# Patient Record
Sex: Male | Born: 1955 | Race: Black or African American | Hispanic: No | Marital: Single | Smoking: Current every day smoker
Health system: Southern US, Community
[De-identification: ages and names within clinical notes are randomized; demographics above are authoritative.]

## PROBLEM LIST (undated history)

## (undated) DIAGNOSIS — I639 Cerebral infarction, unspecified: Secondary | ICD-10-CM

## (undated) DIAGNOSIS — G459 Transient cerebral ischemic attack, unspecified: Secondary | ICD-10-CM

## (undated) DIAGNOSIS — G9340 Encephalopathy, unspecified: Secondary | ICD-10-CM

## (undated) DIAGNOSIS — I10 Essential (primary) hypertension: Secondary | ICD-10-CM

## (undated) DIAGNOSIS — E7800 Pure hypercholesterolemia, unspecified: Secondary | ICD-10-CM

## (undated) HISTORY — PX: NO PAST SURGERIES: SHX2092

---

## 2017-06-23 ENCOUNTER — Emergency Department (HOSPITAL_COMMUNITY)
Admission: EM | Admit: 2017-06-23 | Discharge: 2017-06-23 | Disposition: A | Discharge status: Home / Self Care | Payer: Medicaid Other | Attending: Emergency Medicine | Admitting: Emergency Medicine

## 2017-06-23 ENCOUNTER — Encounter (HOSPITAL_COMMUNITY): Payer: Self-pay

## 2017-06-23 ENCOUNTER — Other Ambulatory Visit: Payer: Self-pay

## 2017-06-23 ENCOUNTER — Emergency Department (HOSPITAL_COMMUNITY): Payer: Medicaid Other

## 2017-06-23 DIAGNOSIS — X58XXXA Exposure to other specified factors, initial encounter: Secondary | ICD-10-CM | POA: Insufficient documentation

## 2017-06-23 DIAGNOSIS — Y939 Activity, unspecified: Secondary | ICD-10-CM | POA: Insufficient documentation

## 2017-06-23 DIAGNOSIS — Y999 Unspecified external cause status: Secondary | ICD-10-CM | POA: Diagnosis not present

## 2017-06-23 DIAGNOSIS — Y929 Unspecified place or not applicable: Secondary | ICD-10-CM | POA: Diagnosis not present

## 2017-06-23 DIAGNOSIS — S8991XA Unspecified injury of right lower leg, initial encounter: Secondary | ICD-10-CM | POA: Diagnosis present

## 2017-06-23 DIAGNOSIS — M25561 Pain in right knee: Secondary | ICD-10-CM

## 2017-06-23 MED ORDER — MELOXICAM 7.5 MG PO TABS: 7.5000 mg | ORAL_TABLET | Freq: Every day | ORAL | 0 refills | Status: DC

## 2017-06-23 NOTE — ED Provider Notes (Signed)
[place] DEPT Provider Note   CSN: [id number] Arrival date & time: 06/23/17  1335     History   Chief Complaint Chief Complaint  Patient presents with  . Leg Injury    HPI Karl Miller is a 61 y.o. male.  The history is provided by the patient. No language interpreter was used.  Knee Pain   This is a new problem. The problem occurs constantly. The problem has been gradually worsening. The pain is present in the right knee. The quality of the pain is described as aching. The pain is moderate. He has tried nothing for the symptoms. The treatment provided no relief. There has been no history of extremity trauma.  Pt complains of pain in his right knee,  Pain with walking.  Pt reports he was seen at [facility] 3 weeks ago.  Pt reports he was given medication that did not work.   History reviewed. No pertinent past medical history.  There are no active problems to display for this patient.   History reviewed. No pertinent surgical history.     Home Medications    Prior to Admission medications   Not on File    Family History No family history on file.  Social History Social History   Tobacco Use  . Smoking status: Never Smoker  . Smokeless tobacco: Never Used  Substance Use Topics  . Alcohol use: No    Frequency: Never  . Drug use: No     Allergies   Patient has no known allergies.   Review of Systems Review of Systems  Musculoskeletal: Negative for joint swelling.  All other systems reviewed and are negative.    Physical Exam Updated Vital Signs BP (!) 147/109 (BP Location: Left Arm)   Pulse 86   Temp 97.7 F (36.5 C) (Oral)   Resp 20   Ht 5\' 9"  (1.753 m)   Wt 77.1 kg (170 lb)   SpO2 100%   BMI 25.10 kg/m   Physical Exam  Constitutional: He appears well-developed and well-nourished.  HENT:  Head: Normocephalic.  Eyes: Pupils are equal, round, and reactive to light.  Neck: Normal range of motion.    Musculoskeletal: He exhibits tenderness.  Tender posterior right knee, pain with range of motion,  nv and ns intact  Neurological: He is alert.  Skin: Skin is warm.  Nursing note and vitals reviewed.    ED Treatments / Results  Labs (all labs ordered are listed, but only abnormal results are displayed) Labs Reviewed - No data to display  EKG  EKG Interpretation None       Radiology Dg Knee Complete 4 Views Right  Result Date: 06/23/2017 CLINICAL DATA:  Posterior right knee pain EXAM: RIGHT KNEE - COMPLETE 4+ VIEW COMPARISON:  None. FINDINGS: No evidence of fracture, dislocation, or joint effusion. No evidence of arthropathy or other focal bone abnormality. Soft tissues are unremarkable. IMPRESSION: No acute osseous injury of the right knee. Electronically Signed   By: Kathreen Devoid   On: 06/23/2017 15:31    Procedures Procedures (including critical care time)  Medications Ordered in ED Medications - No data to display   Initial Impression / Assessment and Plan / ED Course  I have reviewed the triage vital signs and the nursing notes.  Pertinent labs & imaging results that were available during my care of the patient were reviewed by me and considered in my medical decision making (see chart for details).  Knee sleeve.  Pt advised to follow up with his MD at [facility] for recheck.  Final Clinical Impressions(s) / ED Diagnoses   Final diagnoses:  Acute pain of right knee    ED Discharge Orders        Ordered    meloxicam (MOBIC) 7.5 MG tablet  Daily     06/23/17 1607       Fransico Meadow, [place] 06/23/17 1656    Lacretia Leigh, MD 06/26/17 [phone]

## 2017-06-23 NOTE — ED Provider Notes (Signed)
  [place] DEPT Provider Note   CSN: [id number] Arrival date & time: 06/23/17  1335     History   Chief Complaint Chief Complaint  Patient presents with  . Leg Injury    HPI Karl Miller is a 61 y.o. male.  HPI  History reviewed. No pertinent past medical history.  There are no active problems to display for this patient.   History reviewed. No pertinent surgical history.     Home Medications    Prior to Admission medications   Not on File    Family History No family history on file.  Social History Social History   Tobacco Use  . Smoking status: Never Smoker  . Smokeless tobacco: Never Used  Substance Use Topics  . Alcohol use: No    Frequency: Never  . Drug use: No     Allergies   Patient has no known allergies.   Review of Systems Review of Systems   Physical Exam Updated Vital Signs BP (!) 147/109 (BP Location: Left Arm)   Pulse 86   Temp 97.7 F (36.5 C) (Oral)   Resp 20   Ht 5\' 9"  (1.753 m)   Wt 77.1 kg (170 lb)   SpO2 100%   BMI 25.10 kg/m   Physical Exam   ED Treatments / Results  Labs (all labs ordered are listed, but only abnormal results are displayed) Labs Reviewed - No data to display  EKG  EKG Interpretation None       Radiology No results found.  Procedures Procedures (including critical care time)  Medications Ordered in ED Medications - No data to display   Initial Impression / Assessment and Plan / ED Course  I have reviewed the triage vital signs and the nursing notes.  Pertinent labs & imaging results that were available during my care of the patient were reviewed by me and considered in my medical decision making (see chart for details).     An After Visit Summary was printed and given to the patient.   Final Clinical Impressions(s) / ED Diagnoses   Final diagnoses:  Acute pain of right knee    ED Discharge Orders        Ordered    meloxicam  (MOBIC) 7.5 MG tablet  Daily     06/23/17 1607    [facility]   Sidney Ace 06/25/17 1732    Lacretia Leigh, MD 06/26/17 [phone]

## 2017-06-23 NOTE — Discharge Instructions (Signed)
Follow up with your Physician at the [facility] for recheck

## 2017-06-23 NOTE — ED Triage Notes (Signed)
Pt reports that he feels as if he pulled something in the back of his rt knee 3 weeks ago. Pt reports pain with ambulation and when leg extension. 8/10 aches. No deformities , redness or swelling is noted. Pt denies any injuries.

## 2017-12-03 ENCOUNTER — Inpatient Hospital Stay (HOSPITAL_COMMUNITY)
Admission: EM | Admit: 2017-12-03 | Discharge: 2017-12-09 | DRG: 041 | Disposition: A | Discharge status: Skilled Nursing Facility | Payer: Medicaid Other | Attending: Student in an Organized Health Care Education/Training Program | Admitting: Student in an Organized Health Care Education/Training Program

## 2017-12-03 ENCOUNTER — Other Ambulatory Visit: Payer: Self-pay

## 2017-12-03 ENCOUNTER — Encounter (HOSPITAL_COMMUNITY): Payer: Self-pay | Admitting: Emergency Medicine

## 2017-12-03 DIAGNOSIS — F17210 Nicotine dependence, cigarettes, uncomplicated: Secondary | ICD-10-CM | POA: Diagnosis present

## 2017-12-03 DIAGNOSIS — I739 Peripheral vascular disease, unspecified: Secondary | ICD-10-CM | POA: Diagnosis present

## 2017-12-03 DIAGNOSIS — Z7952 Long term (current) use of systemic steroids: Secondary | ICD-10-CM

## 2017-12-03 DIAGNOSIS — G459 Transient cerebral ischemic attack, unspecified: Secondary | ICD-10-CM

## 2017-12-03 DIAGNOSIS — N179 Acute kidney failure, unspecified: Secondary | ICD-10-CM

## 2017-12-03 DIAGNOSIS — J439 Emphysema, unspecified: Secondary | ICD-10-CM | POA: Diagnosis present

## 2017-12-03 DIAGNOSIS — Z803 Family history of malignant neoplasm of breast: Secondary | ICD-10-CM

## 2017-12-03 DIAGNOSIS — I6340 Cerebral infarction due to embolism of unspecified cerebral artery: Secondary | ICD-10-CM

## 2017-12-03 DIAGNOSIS — N400 Enlarged prostate without lower urinary tract symptoms: Secondary | ICD-10-CM | POA: Diagnosis present

## 2017-12-03 DIAGNOSIS — D62 Acute posthemorrhagic anemia: Secondary | ICD-10-CM

## 2017-12-03 DIAGNOSIS — N183 Chronic kidney disease, stage 3 unspecified: Secondary | ICD-10-CM

## 2017-12-03 DIAGNOSIS — M6282 Rhabdomyolysis: Secondary | ICD-10-CM

## 2017-12-03 DIAGNOSIS — E669 Obesity, unspecified: Secondary | ICD-10-CM | POA: Diagnosis present

## 2017-12-03 DIAGNOSIS — I63412 Cerebral infarction due to embolism of left middle cerebral artery: Secondary | ICD-10-CM

## 2017-12-03 DIAGNOSIS — Z720 Tobacco use: Secondary | ICD-10-CM

## 2017-12-03 DIAGNOSIS — G8191 Hemiplegia, unspecified affecting right dominant side: Secondary | ICD-10-CM | POA: Diagnosis present

## 2017-12-03 DIAGNOSIS — I63531 Cerebral infarction due to unspecified occlusion or stenosis of right posterior cerebral artery: Principal | ICD-10-CM | POA: Diagnosis present

## 2017-12-03 DIAGNOSIS — R7303 Prediabetes: Secondary | ICD-10-CM

## 2017-12-03 DIAGNOSIS — I5189 Other ill-defined heart diseases: Secondary | ICD-10-CM

## 2017-12-03 DIAGNOSIS — A539 Syphilis, unspecified: Secondary | ICD-10-CM

## 2017-12-03 DIAGNOSIS — Z6826 Body mass index (BMI) 26.0-26.9, adult: Secondary | ICD-10-CM

## 2017-12-03 DIAGNOSIS — E785 Hyperlipidemia, unspecified: Secondary | ICD-10-CM

## 2017-12-03 DIAGNOSIS — Z806 Family history of leukemia: Secondary | ICD-10-CM

## 2017-12-03 DIAGNOSIS — Z8673 Personal history of transient ischemic attack (TIA), and cerebral infarction without residual deficits: Secondary | ICD-10-CM

## 2017-12-03 DIAGNOSIS — E538 Deficiency of other specified B group vitamins: Secondary | ICD-10-CM | POA: Diagnosis present

## 2017-12-03 DIAGNOSIS — R972 Elevated prostate specific antigen [PSA]: Secondary | ICD-10-CM

## 2017-12-03 DIAGNOSIS — F17200 Nicotine dependence, unspecified, uncomplicated: Secondary | ICD-10-CM

## 2017-12-03 DIAGNOSIS — G9340 Encephalopathy, unspecified: Secondary | ICD-10-CM

## 2017-12-03 DIAGNOSIS — I10 Essential (primary) hypertension: Secondary | ICD-10-CM

## 2017-12-03 HISTORY — DX: Encephalopathy, unspecified: G93.40

## 2017-12-03 HISTORY — DX: Transient cerebral ischemic attack, unspecified: G45.9

## 2017-12-03 HISTORY — DX: Essential (primary) hypertension: I10

## 2017-12-03 HISTORY — DX: Pure hypercholesterolemia, unspecified: E78.00

## 2017-12-03 HISTORY — DX: Cerebral infarction, unspecified: I63.9

## 2017-12-03 LAB — CBC
HEMATOCRIT: 36.7 % — AB (ref 39.0–52.0)
Hemoglobin: 12 g/dL — ABNORMAL LOW (ref 13.0–17.0)
MCH: 28.8 pg (ref 26.0–34.0)
MCHC: 32.7 g/dL (ref 30.0–36.0)
MCV: 88 fL (ref 78.0–100.0)
Platelets: 277 10*3/uL (ref 150–400)
RBC: 4.17 MIL/uL — ABNORMAL LOW (ref 4.22–5.81)
RDW: 14.6 % (ref 11.5–15.5)
WBC: 7.5 10*3/uL (ref 4.0–10.5)

## 2017-12-03 LAB — I-STAT TROPONIN, ED: Troponin i, poc: 0.01 ng/mL (ref 0.00–0.08)

## 2017-12-03 NOTE — ED Triage Notes (Signed)
Patient was found outside by brother around 3pm today.  Patient states that he felt weak and fell to the ground.  Brother states that he was not really responding to him at that time.  Patient states that he remembers just being weak.  No slurred speech, equal hand grips.  Patient is CAO to self and place, gait is off.

## 2017-12-04 ENCOUNTER — Other Ambulatory Visit: Payer: Self-pay

## 2017-12-04 ENCOUNTER — Observation Stay (HOSPITAL_COMMUNITY): Payer: Medicaid Other

## 2017-12-04 ENCOUNTER — Encounter (HOSPITAL_COMMUNITY): Payer: Self-pay | Admitting: General Practice

## 2017-12-04 ENCOUNTER — Emergency Department (HOSPITAL_COMMUNITY): Payer: Medicaid Other

## 2017-12-04 DIAGNOSIS — R972 Elevated prostate specific antigen [PSA]: Secondary | ICD-10-CM

## 2017-12-04 DIAGNOSIS — F17210 Nicotine dependence, cigarettes, uncomplicated: Secondary | ICD-10-CM

## 2017-12-04 DIAGNOSIS — I10 Essential (primary) hypertension: Secondary | ICD-10-CM

## 2017-12-04 DIAGNOSIS — I639 Cerebral infarction, unspecified: Secondary | ICD-10-CM

## 2017-12-04 DIAGNOSIS — N179 Acute kidney failure, unspecified: Secondary | ICD-10-CM

## 2017-12-04 DIAGNOSIS — M6282 Rhabdomyolysis: Secondary | ICD-10-CM

## 2017-12-04 DIAGNOSIS — E538 Deficiency of other specified B group vitamins: Secondary | ICD-10-CM

## 2017-12-04 DIAGNOSIS — G9340 Encephalopathy, unspecified: Secondary | ICD-10-CM | POA: Diagnosis present

## 2017-12-04 DIAGNOSIS — Z8249 Family history of ischemic heart disease and other diseases of the circulatory system: Secondary | ICD-10-CM

## 2017-12-04 DIAGNOSIS — K0889 Other specified disorders of teeth and supporting structures: Secondary | ICD-10-CM

## 2017-12-04 DIAGNOSIS — I63531 Cerebral infarction due to unspecified occlusion or stenosis of right posterior cerebral artery: Secondary | ICD-10-CM | POA: Diagnosis not present

## 2017-12-04 DIAGNOSIS — R220 Localized swelling, mass and lump, head: Secondary | ICD-10-CM

## 2017-12-04 LAB — URINALYSIS, ROUTINE W REFLEX MICROSCOPIC
Bacteria, UA: NONE SEEN
Bilirubin Urine: NEGATIVE
GLUCOSE, UA: NEGATIVE mg/dL
Ketones, ur: NEGATIVE mg/dL
LEUKOCYTES UA: NEGATIVE
Nitrite: NEGATIVE
PROTEIN: NEGATIVE mg/dL
SPECIFIC GRAVITY, URINE: 1.027 (ref 1.005–1.030)
pH: 5 (ref 5.0–8.0)

## 2017-12-04 LAB — RAPID URINE DRUG SCREEN, HOSP PERFORMED
Amphetamines: NOT DETECTED
BARBITURATES: NOT DETECTED
Benzodiazepines: NOT DETECTED
COCAINE: NOT DETECTED
Opiates: NOT DETECTED
TETRAHYDROCANNABINOL: NOT DETECTED

## 2017-12-04 LAB — COMPREHENSIVE METABOLIC PANEL
ALBUMIN: 3.7 g/dL (ref 3.5–5.0)
ALT: 16 U/L — ABNORMAL LOW (ref 17–63)
ANION GAP: 9 (ref 5–15)
AST: 40 U/L (ref 15–41)
Alkaline Phosphatase: 51 U/L (ref 38–126)
BUN: 20 mg/dL (ref 6–20)
CHLORIDE: 111 mmol/L (ref 101–111)
CO2: 24 mmol/L (ref 22–32)
Calcium: 9.9 mg/dL (ref 8.9–10.3)
Creatinine, Ser: 1.66 mg/dL — ABNORMAL HIGH (ref 0.61–1.24)
GFR calc Af Amer: 49 mL/min — ABNORMAL LOW (ref >60)
GFR calc non Af Amer: 42 mL/min — ABNORMAL LOW (ref >60)
GLUCOSE: 107 mg/dL — AB (ref 65–99)
POTASSIUM: 4 mmol/L (ref 3.5–5.1)
SODIUM: 144 mmol/L (ref 135–145)
TOTAL PROTEIN: 7.1 g/dL (ref 6.5–8.1)
Total Bilirubin: 0.5 mg/dL (ref 0.3–1.2)

## 2017-12-04 LAB — VITAMIN B12: VITAMIN B 12: 319 pg/mL (ref 180–914)

## 2017-12-04 LAB — TROPONIN I
Troponin I: 0.03 ng/mL (ref <0.03)
Troponin I: 0.04 ng/mL (ref <0.03)

## 2017-12-04 LAB — ETHANOL

## 2017-12-04 LAB — CK: Total CK: 3646 U/L — ABNORMAL HIGH (ref 49–397)

## 2017-12-04 LAB — CBG MONITORING, ED: Glucose-Capillary: 117 mg/dL — ABNORMAL HIGH (ref 65–99)

## 2017-12-04 LAB — TSH: TSH: 1.128 u[IU]/mL (ref 0.350–4.500)

## 2017-12-04 MED ORDER — FINASTERIDE 5 MG PO TABS
5.0000 mg | ORAL_TABLET | Freq: Every day | ORAL | Status: DC
  Administered 2017-12-04 – 2017-12-09 (×6): 5 mg via ORAL
  Filled 2017-12-04 (×6): qty 1

## 2017-12-04 MED ORDER — SODIUM CHLORIDE 0.9 % IV SOLN
INTRAVENOUS | Status: DC
  Administered 2017-12-04 – 2017-12-05 (×2): via INTRAVENOUS

## 2017-12-04 MED ORDER — FERROUS SULFATE 325 (65 FE) MG PO TABS
325.0000 mg | ORAL_TABLET | ORAL | Status: DC
  Administered 2017-12-04 – 2017-12-09 (×3): 325 mg via ORAL
  Filled 2017-12-04 (×4): qty 1

## 2017-12-04 MED ORDER — SODIUM CHLORIDE 0.9 % IV BOLUS
1000.0000 mL | Freq: Once | INTRAVENOUS | Status: AC
  Administered 2017-12-04: 1000 mL via INTRAVENOUS

## 2017-12-04 MED ORDER — ACETAMINOPHEN 325 MG PO TABS
650.0000 mg | ORAL_TABLET | Freq: Four times a day (QID) | ORAL | Status: DC | PRN
  Administered 2017-12-07: 650 mg via ORAL
  Filled 2017-12-04: qty 2

## 2017-12-04 MED ORDER — ACETAMINOPHEN 650 MG RE SUPP: 650.0000 mg | Freq: Four times a day (QID) | RECTAL | Status: DC | PRN

## 2017-12-04 MED ORDER — ENOXAPARIN SODIUM 40 MG/0.4ML ~~LOC~~ SOLN
40.0000 mg | SUBCUTANEOUS | Status: DC
  Administered 2017-12-04 – 2017-12-08 (×5): 40 mg via SUBCUTANEOUS
  Filled 2017-12-04 (×5): qty 0.4

## 2017-12-04 MED ORDER — RAMELTEON 8 MG PO TABS
8.0000 mg | ORAL_TABLET | Freq: Every day | ORAL | Status: DC
  Administered 2017-12-04 – 2017-12-09 (×5): 8 mg via ORAL
  Filled 2017-12-04 (×6): qty 1

## 2017-12-04 MED ORDER — IOPAMIDOL (ISOVUE-370) INJECTION 76%
50.0000 mL | Freq: Once | INTRAVENOUS | Status: AC | PRN
  Administered 2017-12-04: 50 mL via INTRAVENOUS

## 2017-12-04 MED ORDER — VITAMIN B-12 1000 MCG PO TABS
1000.0000 ug | ORAL_TABLET | Freq: Every day | ORAL | Status: DC
  Administered 2017-12-04 – 2017-12-09 (×6): 1000 ug via ORAL
  Filled 2017-12-04 (×6): qty 1

## 2017-12-04 MED ORDER — SODIUM CHLORIDE 0.9% FLUSH
3.0000 mL | Freq: Two times a day (BID) | INTRAVENOUS | Status: DC
  Administered 2017-12-04 – 2017-12-08 (×4): 3 mL via INTRAVENOUS

## 2017-12-04 MED ORDER — STROKE: EARLY STAGES OF RECOVERY BOOK
Freq: Once | Status: AC
  Administered 2017-12-05: 13:00:00

## 2017-12-04 MED ORDER — ASPIRIN 81 MG PO CHEW
81.0000 mg | CHEWABLE_TABLET | Freq: Every day | ORAL | Status: DC
  Administered 2017-12-04 – 2017-12-09 (×6): 81 mg via ORAL
  Filled 2017-12-04 (×6): qty 1

## 2017-12-04 MED ORDER — IOPAMIDOL (ISOVUE-370) INJECTION 76%
INTRAVENOUS | Status: AC
  Filled 2017-12-04: qty 50

## 2017-12-04 MED ORDER — POLYETHYLENE GLYCOL 3350 17 G PO PACK: 17.0000 g | PACK | Freq: Every day | ORAL | Status: DC | PRN

## 2017-12-04 NOTE — ED Provider Notes (Signed)
[facility] EMERGENCY DEPARTMENT Provider Note   CSN: [id number] Arrival date & time: 12/03/17  2208     History   Chief Complaint Chief Complaint  Patient presents with  . Altered Mental Status   HPI Patient is a 62 year old male with history of HTN, tobacco use, TIA who presents with AMS.  Brother is at bedside and provides majority of history.  He reports that patient normally lives at home with her elderly mother.  When he drove by the house, he saw all the doors open which prompted him to look around.  He found the patient awake and lying in the backyard.  Unknown downtime.  Patient reports that his legs gave out on him, and wasn't able to stand up.  He seemed to be better after getting in the house with brother's assistance, and brother left.  His mother called him later in the evening reporting the patient was not acting right, acting more confused than normal. Brother thinks his walking is worse than usual.   Patient has history of TIA and seems to have some difficulty with ambulation, normally walks with a cane.  Last TIA was several years ago, described by brother as patient dragging his left leg. His brother notes that his walking has gradually been worsening over the year.  Patient currently denies any complaints.  He denies any preceding symptoms of chest pain, shortness of breath, paresthesias, changes in vision.   Past Medical History:  Diagnosis Date  . Stroke [facility])     Patient Active Problem List   Diagnosis Date Noted  . Acute encephalopathy 12/04/2017  . HTN (hypertension) 12/04/2017  . AKI (acute kidney injury) ([place]) 12/04/2017  . Elevated PSA 12/04/2017  . Rhabdomyolysis 12/04/2017    History reviewed. No pertinent surgical history.      Home Medications    Prior to Admission medications   Medication Sig Start Date End Date Taking? Authorizing Provider  ferrous sulfate 325 (65 FE) MG tablet Take 325 mg by mouth See admin instructions.  Take 1 tablet on Monday, Wednesday, And Friday   Yes [provider]  finasteride (PROSCAR) 5 MG tablet Take 5 mg by mouth daily.   Yes [provider]  lisinopril-hydrochlorothiazide (PRINZIDE,ZESTORETIC) 20-12.5 MG tablet Take 1 tablet by mouth daily.   Yes [provider]  methocarbamol (ROBAXIN) 750 MG tablet Take 750 mg by mouth every 8 (eight) hours as needed for muscle spasms.   Yes [provider]  predniSONE (DELTASONE) 20 MG tablet Take 40 mg by mouth daily as needed.   Yes [provider]  senna (SENOKOT) 8.6 MG TABS tablet Take 2 tablets by mouth daily.   Yes [provider]  vitamin B-12 (CYANOCOBALAMIN) 500 MCG tablet Take 1,000 mcg by mouth daily.   Yes [provider]  meloxicam (MOBIC) 7.5 MG tablet Take 1 tablet (7.5 mg total) by mouth daily. Patient not taking: Reported on 12/04/2017 06/23/17   Sidney Ace    Family History No family history on file.  Social History Social History   Tobacco Use  . Smoking status: Current Every Day Smoker  . Smokeless tobacco: Never Used  Substance Use Topics  . Alcohol use: No    Frequency: Never  . Drug use: No     Allergies   Patient has no known allergies.   Review of Systems Review of Systems  Constitutional: Negative for chills and fever.  HENT: Negative for ear pain and  sore throat.   Eyes: Negative for pain and visual disturbance.  Respiratory: Negative for cough and shortness of breath.   Cardiovascular: Negative for chest pain and palpitations.  Gastrointestinal: Negative for abdominal pain and vomiting.  Genitourinary: Negative for dysuria and hematuria.  Musculoskeletal: Positive for gait problem. Negative for arthralgias and back pain.  Skin: Negative for color change and rash.  Neurological: Negative for seizures and syncope.  Psychiatric/Behavioral: Positive for confusion.  All other systems reviewed and are negative.    Physical  Exam Updated Vital Signs BP (!) 161/95 (BP Location: Right Arm)   Pulse (!) 52   Temp 98.6 F (37 C) (Oral)   Resp 17   SpO2 100%   Physical Exam  Constitutional: He appears well-developed and well-nourished.  HENT:  Head: Normocephalic and atraumatic.  Eyes: Conjunctivae are normal.  Neck: Neck supple.  Cardiovascular: Normal rate and regular rhythm.  No murmur heard. Pulmonary/Chest: Effort normal and breath sounds normal. No respiratory distress.  Abdominal: Soft. There is no tenderness.  Musculoskeletal: He exhibits no edema.  Neurological: He is alert. He has normal strength. No cranial nerve deficit or sensory deficit. He displays a negative Romberg sign. Gait abnormal.  Oriented to person Ataxic gait  Skin: Skin is warm and dry.  Psychiatric: He has a normal mood and affect.  Nursing note and vitals reviewed.    ED Treatments / Results  Labs (all labs ordered are listed, but only abnormal results are displayed) Labs Reviewed  COMPREHENSIVE METABOLIC PANEL - Abnormal; Notable for the following components:      Result Value   Glucose, Bld 107 (*)    Creatinine, Ser 1.66 (*)    ALT 16 (*)    GFR calc non Af Amer 42 (*)    GFR calc Af Amer 49 (*)    All other components within normal limits  CBC - Abnormal; Notable for the following components:   RBC 4.17 (*)    Hemoglobin 12.0 (*)    HCT 36.7 (*)    All other components within normal limits  CK - Abnormal; Notable for the following components:   Total CK 3,646 (*)    All other components within normal limits  URINALYSIS, ROUTINE W REFLEX MICROSCOPIC - Abnormal; Notable for the following components:   APPearance HAZY (*)    Hgb urine dipstick SMALL (*)    All other components within normal limits  TROPONIN I - Abnormal; Notable for the following components:   Troponin I 0.03 (*)    All other components within normal limits  CBG MONITORING, ED - Abnormal; Notable for the following components:    Glucose-Capillary 117 (*)    All other components within normal limits  RAPID URINE DRUG SCREEN, HOSP PERFORMED  ETHANOL  TSH  VITAMIN B12  HIV ANTIBODY (ROUTINE TESTING)  RPR  TROPONIN I  BASIC METABOLIC PANEL  CK  HEMOGLOBIN A1C  LIPID PANEL  I-STAT TROPONIN, ED    EKG EKG Interpretation  Date/Time:  Thursday Dec 03 2017 22:39:43 EDT Ventricular Rate:  89 PR Interval:  176 QRS Duration: 104 QT Interval:  376 QTC Calculation: 457 R Axis:   46 Text Interpretation:  Normal sinus rhythm Cannot rule out Inferior infarct , age undetermined Abnormal ECG No old tracing to compare Confirmed by Jola Schmidt [phone]) on 12/04/2017 8:13:40 AM   Radiology Dg Chest 2 View  Result Date: 12/04/2017 CLINICAL DATA:  Altered mental status EXAM: CHEST - 2 VIEW COMPARISON:  None. FINDINGS:  Lungs are clear. Heart size and pulmonary vascularity are normal. No adenopathy. No bone lesions. IMPRESSION: No edema or consolidation. Electronically Signed   By: Lowella Grip III M.D.   On: 12/04/2017 08:58   Ct Head Wo Contrast  Result Date: 12/04/2017 CLINICAL DATA:  Dizziness with recent fall.  Altered mental status. EXAM: CT HEAD WITHOUT CONTRAST TECHNIQUE: Contiguous axial images were obtained from the base of the skull through the vertex without intravenous contrast. COMPARISON:  None. FINDINGS: Brain: There is mild diffuse atrophy. There is no intracranial mass, hemorrhage, extra-axial fluid collection, or midline shift. There is evidence of a prior infarct in the posterior inferior left cerebellar hemisphere. There is evidence of a prior infarct in the medial right occipital lobe. There is evidence of a prior infarct involving the genu and much of the posterior limb of the right internal capsule. There is a focal infarct in the inferior left centrum semiovale immediately superior to the left lentiform nucleus. There is extensive small vessel disease with scattered lacunar infarcts throughout the  centra semiovale bilaterally in the supratentorial white matter. There are small infarcts in each medial thalamus. No acute infarct is appreciable on this study. Vascular: No appreciable hyperdense vessel evident. There are scattered foci of calcification in each distal vertebral artery and carotid siphon regions. Skull: Bony calvarium appears intact. Sinuses/Orbits: There is mucosal thickening in several ethmoid air cells. Visualized paranasal sinuses elsewhere are clear. Orbits appear symmetric bilaterally. Other: Mastoid air cells are clear. There is debris in the right external auditory canal. IMPRESSION: Mild atrophy with multifocal prior infarcts and extensive supratentorial small vessel disease. No acute infarct evident. No mass or hemorrhage. Foci of arterial vascular calcification noted. There is mucosal thickening in several ethmoid air cells. There is probable cerumen in the right external auditory canal. Electronically Signed   By: Lowella Grip III M.D.   On: 12/04/2017 09:01   Mr Brain Wo Contrast  Result Date: 12/04/2017 CLINICAL DATA:  Acute encephalopathy and gait disturbance. History of hypertension. EXAM: MRI HEAD WITHOUT CONTRAST TECHNIQUE: Multiplanar, multiecho pulse sequences of the brain and surrounding structures were obtained without intravenous contrast. COMPARISON:  CT HEAD Dec 04, 2017 FINDINGS: Multiple sequences are mildly or moderately motion degraded. INTRACRANIAL CONTENTS: Punctate reduced diffusion LEFT temporal and LEFT occipital lobes, too small to localized on ADC map. 12 mm reduced diffusion posterior LEFT splenium of corpus callosum with normalized ADC values. Punctate LEFT frontal white matter reduced diffusion, 1 of which demonstrates low ADC values. Numerous chronic micro hemorrhages in central and peripheral distribution. Susceptibility artifact associated with old bilateral basal ganglia infarcts. Old bilateral mesial thalamus infarcts. LEFT inferior cerebellar  encephalomalacia. Small area RIGHT mesial occipital lobe encephalomalacia. Faint frontal FLAIR T2 hyperintense signal. Old RIGHT pontine lacunar infarct. Patchy to confluent supratentorial white matter FLAIR T2 hyperintensities with multiple cystic infarct. Moderate parenchymal brain volume loss. No hydrocephalus. No midline shift, mass effect or masses. No abnormal extra-axial fluid collections. VASCULAR: Normal major intracranial vascular flow voids present at skull base. SKULL AND UPPER CERVICAL SPINE: No abnormal sellar expansion. No suspicious calvarial bone marrow signal. Craniocervical junction maintained. LEFT frontal scalp sebaceous cyst. SINUSES/ORBITS: Trace paranasal sinus mucosal thickening without air-fluid levels. Mastoid air cells are well aerated.The included ocular globes and orbital contents are non-suspicious. OTHER: None. IMPRESSION: 1. Motion degraded examination. Multiple small acute and subacute supratentorial infarcts. 2. Faint bifrontal flared hyperintense suggesting motion artifact, less likely atypical hypertensive encephalopathy or metabolic disturbance. 3. Old LEFT PICA and  RIGHT PCA territory infarcts. Old bilateral basal ganglia and thalamus infarcts. 4. Moderate to severe chronic small vessel ischemic changes. 5. Moderate parenchymal brain volume loss. Electronically Signed   By: Elon Alas M.D.   On: 12/04/2017 16:46    Procedures Procedures (including critical care time)  Medications Ordered in ED Medications  ferrous sulfate tablet 325 mg (325 mg Oral Given 12/04/17 1451)  finasteride (PROSCAR) tablet 5 mg (5 mg Oral Given 12/04/17 1450)  vitamin B-12 (CYANOCOBALAMIN) tablet 1,000 mcg (1,000 mcg Oral Given 12/04/17 1450)  enoxaparin (LOVENOX) injection 40 mg (has no administration in time range)  sodium chloride flush (NS) 0.9 % injection 3 mL (has no administration in time range)  acetaminophen (TYLENOL) tablet 650 mg (has no administration in time range)    Or    acetaminophen (TYLENOL) suppository 650 mg (has no administration in time range)  polyethylene glycol (MIRALAX / GLYCOLAX) packet 17 g (has no administration in time range)  0.9 %  sodium chloride infusion ( Intravenous New Bag/Given 12/04/17 1451)  aspirin chewable tablet 81 mg (81 mg Oral Given 12/04/17 1450)   stroke: mapping our early stages of recovery book (has no administration in time range)  sodium chloride 0.9 % bolus 1,000 mL (0 mLs Intravenous Stopped 12/04/17 1100)     Initial Impression / Assessment and Plan / ED Course  I have reviewed the triage vital signs and the nursing notes.  Pertinent labs & imaging results that were available during my care of the patient were reviewed by me and considered in my medical decision making (see chart for details).     Patient is a 62 year old male with history of HTN, TIA who presents after fall with altered mental status. Reportedly gradual mental decline over past years, with acute worsening past 24 hours. Poor PCP follow up. Initial exam as above, patient with ataxia on gait and unable to ambulate without assistance.   Initial labs significant for Cr elevated to 1.66 (limited medical records, unknown baseline).  CK elevated to 3600.   CT head without acute findings, although mild atrophy with multifocal prior infarcts and extensive small vessel disease.  Pending UDS.  Negative EtOH.  Discussed with internal medicine for admission for further work-up for acute on chronic altered mental status and gait instability. Patient in agreement with plan at time of admission.  Patient and plan of care discussed with Attending physician, Dr. Venora Maples.    Final Clinical Impressions(s) / ED Diagnoses   Final diagnoses:  Acute encephalopathy    ED Discharge Orders    None       Arnetha Massy, MD 12/04/17 Ambrose Mantle    Jola Schmidt, MD 12/09/17 1535

## 2017-12-04 NOTE — Progress Notes (Signed)
Troponin 0.03 MD made aware 

## 2017-12-04 NOTE — Progress Notes (Signed)
Txt paged IM Karl Chess, MD Pt has got out of bed twice asking for his dog. Redirected that he was in the hospital. Pt stated he knew that but insisted on finding his dog at home. The second time getting up he wet the bed. See MAR for orders.

## 2017-12-04 NOTE — ED Notes (Signed)
Notified nurse of pt.bp.[phone]

## 2017-12-04 NOTE — Consult Note (Signed)
Neurology Consultation Reason for Consult: Stroke Referring Physician: Orie Fisherman  CC: Unsteadiness  History is obtained from: Patient  HPI: Karl Miller is a 62 y.o. male with a history of previous stroke who presents with unsteady gait and confusion.  He states that is been going on for a couple of weeks.  He denies any focal weakness or numbness.  He denies double vision.  He denies difficulty reading.  He has not noticed any particular difficulty with speech.   LKW: Several weeks ago tpa given?: no, outside of window    ROS: A 14 point ROS was performed and is negative except as noted in the HPI.  Past Medical History:  Diagnosis Date  . Acute encephalopathy 12/03/2017   [name] 12/04/2017  . High cholesterol   . Hypertension   . Stroke ([place])   . TIA (transient ischemic attack) 12/03/2017   [name] 12/04/2017     Family history: No history of stroke   Social History:  reports that he has been smoking cigarettes.  He has a 12.00 pack-year smoking history. He has never used smokeless tobacco. He reports that he does not drink alcohol or use drugs.   Exam: Current vital signs: BP (!) 176/95 (BP Location: Right Arm)   Pulse 79   Temp 98.2 F (36.8 C) (Oral)   Resp 17   SpO2 96%  Vital signs in last 24 hours: Temp:  [98 F (36.7 C)-98.6 F (37 C)] 98.2 F (36.8 C) (05/17 2000) Pulse Rate:  [52-95] 79 (05/17 2000) Resp:  [17-29] 17 (05/17 1429) BP: (133-176)/(84-108) 176/95 (05/17 2000) SpO2:  [96 %-100 %] 96 % (05/17 2000)   Physical Exam  Constitutional: Appears well-developed and well-nourished.  Psych: Affect appropriate to situation Eyes: No scleral injection HENT: No OP obstrucion Head: Normocephalic.  Cardiovascular: Normal rate and regular rhythm.  Respiratory: Effort normal, non-labored breathing GI: Soft.  No distension. There is no tenderness.  Skin: WDI  Neuro: Mental Status: Patient is awake, alert, oriented to person, he gives the month  is April and the year is 56 Patient is able to give a clear and coherent history. No signs of aphasia or neglect Cranial Nerves: II: Visual Fields are full. Pupils are equal, round, and reactive to light.   III,IV, VI: EOMI without ptosis or diploplia.  V: Facial sensation is symmetric to temperature VII: Facial movement is symmetric.  VIII: hearing is intact to voice X: Uvula elevates symmetrically XI: Shoulder shrug is symmetric. XII: tongue is midline without atrophy or fasciculations.  Motor: Tone is normal. Bulk is normal. 5/5 strength was present in bilateral arms in the left leg, he has 4/5 strength in the right leg Sensory: Sensation is symmetric to light touch and temperature in the arms and legs. 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  He will need further work-up.  Recommendations: 1. HgbA1c, fasting lipid panel 2.  MRA head, carotid Dopplers 4. Echocardiogram 5. Prophylactic therapy-continue home Plavix 6. Risk factor modification 7. Telemetry monitoring 8. PT consult, OT consult, Speech consult 9. please page stroke NP  Or  PA  Or MD  from 8am -4 pm as this patient will be  followed by the stroke team at this point.   You can look them up on [email or web address]      Roland Rack, MD [facility] [phone]  If 7pm- 7am, please page neurology on call as listed in [place].

## 2017-12-04 NOTE — H&P (Signed)
Date: 12/04/2017               Patient Name:  Karl Miller MRN: [id number]  DOB: 06/28/1956 Age / Sex: 62 y.o., male   PCP: Patient, No Pcp Per         Medical Service: Internal Medicine Teaching Service         Attending Physician: Dr. Aldine Contes, MD    First Contact: Dr. Maricela Bo Pager: [phone]  Second Contact: Dr. Philipp Ovens Pager: [phone]       After Hours (After 5p/  First Contact Pager: [phone]  weekends / holidays): Second Contact Pager: [phone]   Chief Complaint: Encephalopathy, gait disturbance  History of Present Illness: This is a 62 y.o. man with PMHx of HTN, elevated PSA currently awaiting biopsy per patient's brother, TIA's, tobacco use, and leg pain who presented to the ED with complaint of acute encephalopathy accompanied by gait disturbance.  History was obtained from patient with his brother filling in and providing collateral information.    Apparently patient was walking down a hill yesterday near his house when his balance became off and he subsequently fell.  He was unable to get up on his own but was promptly assisted by 2 men nearby who got him up and assisted him back home.  He subsequently had another fall at his house and was unable to get up again.  His brother found him lying in the backyard and assisted him up and brought him inside this time.  Neither patient or brother are sure of how long the downtime was but it was at least 15 minutes.  Patient reports that he had lost his balance while walking and denies a prior history of similar episodes.  He does ambulate with a cane normally but did not provide further details into his abnormal gait history.  He denied any feelings of light headedness, dizziness, incontinence, tongue biting, chest pain, shortness of breath, or loss of consciousness either before or after the episodes.  He has not been ill recently and denied any recent sick contacts.  He denies any history of drugs or alcohol.  He does  continue to smoke about 1 pack per day.  After these episodes yesterday, patients mother with whom patient lives, called the brother and reported the patient was not acting his normal self.  Brother reports patient is improved since initial ED presentation but still not back to normal.  He states patient has been weaker recently, not walking as well, and thinks his cognitive skills are off.  Currently, he is alert and oriented to person, DOB, city & state, and president.  He was unsure of the year and building type he is in.  ED Course:  Vitals were notable for mild HTN on presentation that has improved, normal HR, saturating in the upper 90's on room air, and afebrile.  He was noted to be ataxic in the ED.  He was given a liter of normal saline IV fluids.  Labs as below.  [facility] was contacted for admission.   Labs were notable for: Normal sodium and potassium, bicarb 24, glucose 107, BUN 20, Creatinine 1.66 (no prior values). AST 40, ALT 16, alk phos 51, TBilil 0.5, normal protein and albumin. WBC 7.5, Hgb 12.0, HCT 36, MCV 88, platelets 277 I-stat troponin 0.01 Ethanol <10 UDS pending, TSH pending CXR was negative CT head notable for nothing acute.  There was mild atrophy with multifocal prior infarcts and extensive small vessel disease.  Meds:  Current Meds  Medication Sig  . ferrous sulfate 325 (65 FE) MG tablet Take 325 mg by mouth See admin instructions. Take 1 tablet on Monday, Wednesday, And Friday  . finasteride (PROSCAR) 5 MG tablet Take 5 mg by mouth daily.  Marland Kitchen lisinopril-hydrochlorothiazide (PRINZIDE,ZESTORETIC) 20-12.5 MG tablet Take 1 tablet by mouth daily.  . methocarbamol (ROBAXIN) 750 MG tablet Take 750 mg by mouth every 8 (eight) hours as needed for muscle spasms.  . predniSONE (DELTASONE) 20 MG tablet Take 40 mg by mouth daily as needed.  . senna (SENOKOT) 8.6 MG TABS tablet Take 2 tablets by mouth daily.  . vitamin B-12 (CYANOCOBALAMIN) 500 MCG tablet Take 1,000 mcg by mouth  daily.     Allergies: Allergies as of 12/03/2017  . (No Known Allergies)   Past Medical History:  Diagnosis Date  . Stroke [facility])     Family History:  Father had leukemia, mother had breast cancer, siblings with hx of HTN, DM, and ESRD  Social History: Lives in [place] with 38 yo mother, single, no children, former Tour manager, smokes 1 PPD x 40 years, denies EtOH or drugs  Review of Systems: A complete ROS was negative except as per HPI.   Physical Exam: Blood pressure 133/89, pulse 67, temperature 98.5 F (36.9 C), temperature source Oral, resp. rate (!) 29, SpO2 98 %. Physical Exam  Constitutional: He is well-developed, well-nourished, and in no distress.  HENT:  Head: Normocephalic and atraumatic.  Mouth/Throat: Oropharynx is clear and moist. No oropharyngeal exudate.  He has a couple subcutaneous nodules on his forehead.  He has poor dentition.   Eyes: Pupils are equal, round, and reactive to light. Conjunctivae and EOM are normal.  Neck: Normal range of motion. Neck supple.  Cardiovascular: Normal rate and regular rhythm.  Pulmonary/Chest: Effort normal. No respiratory distress. He has no wheezes.  Coarse breath sounds.   Abdominal: Soft. Bowel sounds are normal. He exhibits no distension. There is no tenderness. There is no rebound.  Musculoskeletal: Normal range of motion. He exhibits no edema.  Neurological: He is alert. No cranial nerve deficit. Coordination normal.  He is alert and oriented to person, DOB, city/state, and president. Strength 5/5 bilaterally upper and lower extremity. Normal finger to nose, heel to shin  Skin: Skin is warm and dry.  Psychiatric: Mood and affect normal.     EKG: personally reviewed my interpretation is NSR with rate 89.  Possible old inferior infarct  CXR: personally reviewed my interpretation is lungs appear clear with no evidence of edema or consolidation  Assessment & Plan by Problem: Active Problems:   Acute  encephalopathy  Acute encephalopathy with gait disturbance Unclear etiology as to the cause of his confusion and gait difficulties.  CT head was notable for prior infarcts and small vessel disease, but acute CVA remains on differential.  Otherwise, lab work thus far is unrevealing for acute metabolic cause and exam is non-focal.  He denies syncope, chest pain, palpitations leading up to the event to suggest arrhythmia or ACS.  There is no evidence of infection.  BP is mildly elevated and there is no documentation of hypotension or hypoxia.  EKG is reassuring for no acute ischemia.  Do not suspect seizure based on history.  It sounds like there was been issues with him not walking well for a longer period of time but acutely worsened yesterday. - Check orthostatic vitals - Follow up UDS, TSH, delta troponin - MRI brain w/o contrast to rule  out CVA - PT/OT evaluation - Check HIV, B12 (although reports taking supplementation), RPR given gait abnormalities - Admit to telemetry - Neuro checks  Mild rhabdomyolysis Presumed acute kidney injury 2/2 above There is no history of CKD so his elevated creatinine is presumably acute due to mild rhabdo from unknown down time. - IV fluids - CK in the AM - Follow BMET - I/Os  Hx TIA CT with evidence of small vessel disease and prior infarcts.  He is not on ASA or statin. - Start ASA 35m daily - Lipid panel  - Encourage smoking cessation - MRI as above  Hx HTN Home medications are lisinopril-HCTZ combination - Hold home meds given normal BP, AKI, and rule out CVA  Hx BPH, Elevated PSA - Continue Finasteride - Outpatient follow up  Vitamin B12 Deficiency - Continue home B12 supplements  FEN Fluids: Normal saline Electrolytes: monitor Nutrition: heart healthy  DVT PPx: lovenox  CODE: FULL   Dispo: Admit patient to Observation with expected length of stay less than 2 midnights.  Signed:Jule Ser DO 12/04/2017, 12:51 PM  Pager:  [phone]

## 2017-12-05 ENCOUNTER — Observation Stay (HOSPITAL_COMMUNITY): Payer: Medicaid Other

## 2017-12-05 DIAGNOSIS — N289 Disorder of kidney and ureter, unspecified: Secondary | ICD-10-CM | POA: Diagnosis not present

## 2017-12-05 DIAGNOSIS — Z7982 Long term (current) use of aspirin: Secondary | ICD-10-CM | POA: Diagnosis not present

## 2017-12-05 DIAGNOSIS — Z6826 Body mass index (BMI) 26.0-26.9, adult: Secondary | ICD-10-CM | POA: Diagnosis not present

## 2017-12-05 DIAGNOSIS — A539 Syphilis, unspecified: Secondary | ICD-10-CM | POA: Diagnosis present

## 2017-12-05 DIAGNOSIS — Z79899 Other long term (current) drug therapy: Secondary | ICD-10-CM

## 2017-12-05 DIAGNOSIS — E669 Obesity, unspecified: Secondary | ICD-10-CM | POA: Diagnosis present

## 2017-12-05 DIAGNOSIS — I5030 Unspecified diastolic (congestive) heart failure: Secondary | ICD-10-CM

## 2017-12-05 DIAGNOSIS — I10 Essential (primary) hypertension: Secondary | ICD-10-CM | POA: Diagnosis present

## 2017-12-05 DIAGNOSIS — I63412 Cerebral infarction due to embolism of left middle cerebral artery: Secondary | ICD-10-CM | POA: Diagnosis not present

## 2017-12-05 DIAGNOSIS — N183 Chronic kidney disease, stage 3 (moderate): Secondary | ICD-10-CM | POA: Diagnosis not present

## 2017-12-05 DIAGNOSIS — Z8673 Personal history of transient ischemic attack (TIA), and cerebral infarction without residual deficits: Secondary | ICD-10-CM

## 2017-12-05 DIAGNOSIS — Z9181 History of falling: Secondary | ICD-10-CM | POA: Diagnosis not present

## 2017-12-05 DIAGNOSIS — Z720 Tobacco use: Secondary | ICD-10-CM | POA: Diagnosis not present

## 2017-12-05 DIAGNOSIS — D62 Acute posthemorrhagic anemia: Secondary | ICD-10-CM | POA: Diagnosis not present

## 2017-12-05 DIAGNOSIS — Z7902 Long term (current) use of antithrombotics/antiplatelets: Secondary | ICD-10-CM | POA: Diagnosis not present

## 2017-12-05 DIAGNOSIS — F17200 Nicotine dependence, unspecified, uncomplicated: Secondary | ICD-10-CM | POA: Diagnosis not present

## 2017-12-05 DIAGNOSIS — I6340 Cerebral infarction due to embolism of unspecified cerebral artery: Secondary | ICD-10-CM

## 2017-12-05 DIAGNOSIS — M6282 Rhabdomyolysis: Secondary | ICD-10-CM

## 2017-12-05 DIAGNOSIS — I639 Cerebral infarction, unspecified: Secondary | ICD-10-CM | POA: Diagnosis not present

## 2017-12-05 DIAGNOSIS — I63531 Cerebral infarction due to unspecified occlusion or stenosis of right posterior cerebral artery: Secondary | ICD-10-CM | POA: Diagnosis present

## 2017-12-05 DIAGNOSIS — F17210 Nicotine dependence, cigarettes, uncomplicated: Secondary | ICD-10-CM | POA: Diagnosis present

## 2017-12-05 DIAGNOSIS — R4182 Altered mental status, unspecified: Secondary | ICD-10-CM | POA: Diagnosis present

## 2017-12-05 DIAGNOSIS — Z87438 Personal history of other diseases of male genital organs: Secondary | ICD-10-CM

## 2017-12-05 DIAGNOSIS — Z7952 Long term (current) use of systemic steroids: Secondary | ICD-10-CM | POA: Diagnosis not present

## 2017-12-05 DIAGNOSIS — Z806 Family history of leukemia: Secondary | ICD-10-CM | POA: Diagnosis not present

## 2017-12-05 DIAGNOSIS — I700 Atherosclerosis of aorta: Secondary | ICD-10-CM | POA: Diagnosis not present

## 2017-12-05 DIAGNOSIS — I739 Peripheral vascular disease, unspecified: Secondary | ICD-10-CM | POA: Diagnosis present

## 2017-12-05 DIAGNOSIS — E538 Deficiency of other specified B group vitamins: Secondary | ICD-10-CM | POA: Diagnosis present

## 2017-12-05 DIAGNOSIS — I6389 Other cerebral infarction: Secondary | ICD-10-CM | POA: Diagnosis not present

## 2017-12-05 DIAGNOSIS — I5189 Other ill-defined heart diseases: Secondary | ICD-10-CM | POA: Diagnosis not present

## 2017-12-05 DIAGNOSIS — R269 Unspecified abnormalities of gait and mobility: Secondary | ICD-10-CM | POA: Diagnosis not present

## 2017-12-05 DIAGNOSIS — G8191 Hemiplegia, unspecified affecting right dominant side: Secondary | ICD-10-CM | POA: Diagnosis present

## 2017-12-05 DIAGNOSIS — E785 Hyperlipidemia, unspecified: Secondary | ICD-10-CM | POA: Diagnosis present

## 2017-12-05 DIAGNOSIS — R7303 Prediabetes: Secondary | ICD-10-CM | POA: Diagnosis present

## 2017-12-05 DIAGNOSIS — J439 Emphysema, unspecified: Secondary | ICD-10-CM | POA: Diagnosis present

## 2017-12-05 DIAGNOSIS — N179 Acute kidney failure, unspecified: Secondary | ICD-10-CM | POA: Diagnosis present

## 2017-12-05 DIAGNOSIS — R972 Elevated prostate specific antigen [PSA]: Secondary | ICD-10-CM | POA: Diagnosis not present

## 2017-12-05 DIAGNOSIS — Z803 Family history of malignant neoplasm of breast: Secondary | ICD-10-CM | POA: Diagnosis not present

## 2017-12-05 DIAGNOSIS — G459 Transient cerebral ischemic attack, unspecified: Secondary | ICD-10-CM | POA: Diagnosis not present

## 2017-12-05 DIAGNOSIS — I6350 Cerebral infarction due to unspecified occlusion or stenosis of unspecified cerebral artery: Secondary | ICD-10-CM | POA: Diagnosis not present

## 2017-12-05 DIAGNOSIS — G9340 Encephalopathy, unspecified: Secondary | ICD-10-CM | POA: Diagnosis present

## 2017-12-05 DIAGNOSIS — N400 Enlarged prostate without lower urinary tract symptoms: Secondary | ICD-10-CM | POA: Diagnosis present

## 2017-12-05 DIAGNOSIS — R2681 Unsteadiness on feet: Secondary | ICD-10-CM | POA: Diagnosis not present

## 2017-12-05 LAB — BASIC METABOLIC PANEL
ANION GAP: 7 (ref 5–15)
BUN: 18 mg/dL (ref 6–20)
CHLORIDE: 109 mmol/L (ref 101–111)
CO2: 26 mmol/L (ref 22–32)
Calcium: 8.6 mg/dL — ABNORMAL LOW (ref 8.9–10.3)
Creatinine, Ser: 1.64 mg/dL — ABNORMAL HIGH (ref 0.61–1.24)
GFR calc non Af Amer: 43 mL/min — ABNORMAL LOW (ref >60)
GFR, EST AFRICAN AMERICAN: 49 mL/min — AB (ref >60)
Glucose, Bld: 90 mg/dL (ref 65–99)
POTASSIUM: 3.7 mmol/L (ref 3.5–5.1)
Sodium: 142 mmol/L (ref 135–145)

## 2017-12-05 LAB — TROPONIN I: Troponin I: 0.04 ng/mL (ref <0.03)

## 2017-12-05 LAB — HEMOGLOBIN A1C
Hgb A1c MFr Bld: 5.7 % — ABNORMAL HIGH (ref 4.8–5.6)
Mean Plasma Glucose: 116.89 mg/dL

## 2017-12-05 LAB — LIPID PANEL
CHOL/HDL RATIO: 3.3 ratio
Cholesterol: 137 mg/dL (ref 0–200)
HDL: 41 mg/dL (ref >40)
LDL CALC: 68 mg/dL (ref 0–99)
TRIGLYCERIDES: 139 mg/dL (ref <150)
VLDL: 28 mg/dL (ref 0–40)

## 2017-12-05 LAB — HIV ANTIBODY (ROUTINE TESTING W REFLEX): HIV Screen 4th Generation wRfx: NONREACTIVE

## 2017-12-05 LAB — ECHOCARDIOGRAM COMPLETE

## 2017-12-05 LAB — CK
CK TOTAL: 3783 U/L — AB (ref 49–397)
Total CK: 3300 U/L — ABNORMAL HIGH (ref 49–397)

## 2017-12-05 MED ORDER — SODIUM CHLORIDE 0.9 % IV SOLN
INTRAVENOUS | Status: AC
  Administered 2017-12-05: 13:00:00 via INTRAVENOUS

## 2017-12-05 MED ORDER — NICOTINE 21 MG/24HR TD PT24
21.0000 mg | MEDICATED_PATCH | Freq: Every day | TRANSDERMAL | Status: DC
  Administered 2017-12-05 – 2017-12-08 (×4): 21 mg via TRANSDERMAL
  Filled 2017-12-05 (×5): qty 1

## 2017-12-05 MED ORDER — CLOPIDOGREL BISULFATE 75 MG PO TABS
75.0000 mg | ORAL_TABLET | Freq: Every day | ORAL | Status: DC
  Administered 2017-12-05 – 2017-12-09 (×5): 75 mg via ORAL
  Filled 2017-12-05 (×5): qty 1

## 2017-12-05 MED ORDER — ATORVASTATIN CALCIUM 10 MG PO TABS
20.0000 mg | ORAL_TABLET | Freq: Every day | ORAL | Status: DC
  Administered 2017-12-05 – 2017-12-08 (×4): 20 mg via ORAL
  Filled 2017-12-05 (×4): qty 2

## 2017-12-05 MED ORDER — NICOTINE POLACRILEX 2 MG MT GUM
2.0000 mg | CHEWING_GUM | OROMUCOSAL | Status: DC | PRN
  Administered 2017-12-07: 2 mg via ORAL
  Filled 2017-12-05: qty 1

## 2017-12-05 NOTE — Progress Notes (Signed)
*  PRELIMINARY RESULTS* Vascular Ultrasound Bilateral lower extremity venous duplex has been completed.  Preliminary findings: No evidence of deep vein thrombosis or baker's cysts bilaterally.   Everrett Coombe 12/05/2017, 10:53 AM

## 2017-12-05 NOTE — Progress Notes (Signed)
   Subjective: Karl Miller was seen resting in his bed this morning. He was alert and oriented but slowed to response. He stated that he feels that his bilateral lower extremities are still weak.   Objective:  Vital signs in last 24 hours: Vitals:   12/04/17 2000 12/05/17 0006 12/05/17 0341 12/05/17 0756  BP: (!) 176/95 (!) 168/105 (!) 173/109 (!) 166/101  Pulse: 79 62 75 76  Resp:  18 20 17   Temp: 98.2 F (36.8 C) 98.6 F (37 C) 98 F (36.7 C) 97.8 F (36.6 C)  TempSrc: Oral Oral Oral Oral  SpO2: 96% 100% 100% 100%   Physical Exam  Constitutional: He is oriented to person, place, and time. He appears well-developed and well-nourished. No distress.  slowed  HENT:  Head: Normocephalic and atraumatic.  Eyes: Conjunctivae are normal.  Cardiovascular: Normal rate, regular rhythm and normal heart sounds.  Respiratory: Effort normal and breath sounds normal. No respiratory distress. He has no wheezes.  GI: Soft. Bowel sounds are normal. He exhibits no distension. There is no tenderness.  Musculoskeletal:  5/5 bilateral upper and lower extremity muscle strength  Neurological: He is alert and oriented to person, place, and time. No cranial nerve deficit.  Skin: He is not diaphoretic. No erythema.  Psychiatric: He has a normal mood and affect. His behavior is normal. Judgment and thought content normal.     Assessment/Plan:  Karl Miller is a 62 y.o m with hx of prior stroke, htn, elevated psa awaiting biopsy who presented with acute encephalopathy and gait disturbance. CT head didn't show any acute infarct. MRI found multiple small acute and subacute supratentorial infarcts and old left pica and right pca infarct along with old bilateral basal ganglia and thalamus infarcts. CTA head and neck did not show any significant large vessel occlusion but some distal small vessel atheromatous irregularity.   Supratentorial infarcts  Acute encephalopathy with gait disturbance  The presence  of many supratentorial infarcts places concern for an embolic event causing stroke.   -TTE pending  -Bialteral vascular lower extremity ultrasound did not show any findings for dvt or baker's cysts -Lipid panel: tcholes=137, trig=139, ldl=68 -HbA1c=5.7 -Aspirin 81mg  qd -Vitamin b12 1000mg  qd -Did not start statin as not though to be atherosclerotic cause of stroke -Encourage smoking cessation  Mild rhabdomyolysis  Suspected Acute kidney injury  The patient's CK on admission was 2423 and continues to be elevated at 3783 today 5/18. The patient had multiple falls yesterday and reported downtime for 15 minutes. The patient's cr today is 1.64 from prior 1.66 yesterday, unknown baseline.   -continue normal saline at 140ml/hr for 8 hrs  -Monitor bmp daily  Hx of BPH Elevated PSA Patient is scheduled to get a prostate biopsy done outpatient.   -Finasteride 5mg  qd  Dispo: Anticipated discharge in approximately 0-1 day.   Lars Mage, MD 12/05/2017, 10:47 AM Pager: [phone]

## 2017-12-05 NOTE — Progress Notes (Signed)
STROKE TEAM PROGRESS NOTE   SUBJECTIVE (INTERVAL HISTORY) His mom and brother are at the bedside.  Pt lying in bed, not in distress. No acute distress. No focal neuro deficit this time although not orientated to time.     OBJECTIVE Temp:  [97.8 F (36.6 C)-98.6 F (37 C)] 98.6 F (37 C) (05/18 1153) Pulse Rate:  [52-79] 64 (05/18 1153) Cardiac Rhythm: Normal sinus rhythm (05/18 0700) Resp:  [17-20] 20 (05/18 1153) BP: (161-176)/(95-114) 166/114 (05/18 1153) SpO2:  [96 %-100 %] 100 % (05/18 1153)  CBC:  Recent Labs  Lab 12/03/17 2244  WBC 7.5  HGB 12.0*  HCT 36.7*  MCV 88.0  PLT 627    Basic Metabolic Panel:  Recent Labs  Lab 12/03/17 2244 12/05/17 0405  NA 144 142  K 4.0 3.7  CL 111 109  CO2 24 26  GLUCOSE 107* 90  BUN 20 18  CREATININE 1.66* 1.64*  CALCIUM 9.9 8.6*    Lipid Panel:     Component Value Date/Time   CHOL 137 12/05/2017 0405   TRIG 139 12/05/2017 0405   HDL 41 12/05/2017 0405   CHOLHDL 3.3 12/05/2017 0405   VLDL 28 12/05/2017 0405   LDLCALC 68 12/05/2017 0405   HgbA1c:  Lab Results  Component Value Date   HGBA1C 5.7 (H) 12/05/2017   Urine Drug Screen:     Component Value Date/Time   LABOPIA NONE DETECTED 12/04/2017 1102   COCAINSCRNUR NONE DETECTED 12/04/2017 1102   LABBENZ NONE DETECTED 12/04/2017 1102   AMPHETMU NONE DETECTED 12/04/2017 1102   THCU NONE DETECTED 12/04/2017 1102   LABBARB NONE DETECTED 12/04/2017 1102    Alcohol Level     Component Value Date/Time   ETH <10 12/04/2017 0854    IMAGING I have personally reviewed the radiological images below and agree with the radiology interpretations.  Ct Angio Head W Or Wo Contrast Ct Angio Neck W Or Wo Contrast 12/05/2017 IMPRESSION:   CTA NECK 1. Negative CTA of neck with no hemodynamically significant stenosis identified. Mild atherosclerotic change for age.  2. Extremely poor dentition with innumerable dental caries and periapical lucencies.  3. Emphysema.   CTA  HEAD 1. Negative CTA for large vessel occlusion.  2. Moderate atheromatous bilateral V4 stenoses.  3. No other hemodynamically significant or correctable stenosis identified.  4. Distal small vessel atheromatous irregularity.    Dg Chest 2 View 12/04/2017 IMPRESSION: No edema or consolidation.    Ct Head Wo Contrast 12/04/2017 IMPRESSION:  Mild atrophy with multifocal prior infarcts and extensive supratentorial small vessel disease. No acute infarct evident. No mass or hemorrhage. Foci of arterial vascular calcification noted. There is mucosal thickening in several ethmoid air cells. There is probable cerumen in the right external auditory canal.   Mr Brain Wo Contrast 12/04/2017 IMPRESSION:  1. Motion degraded examination. Multiple small acute and subacute supratentorial infarcts.  2. Faint bifrontal flared hyperintense suggesting motion artifact, less likely atypical hypertensive encephalopathy or metabolic disturbance.  3. Old LEFT PICA and RIGHT PCA territory infarcts. Old bilateral basal ganglia and thalamus infarcts.  4. Moderate to severe chronic small vessel ischemic changes.  5. Moderate parenchymal brain volume loss.   Transthoracic Echocardiogram - Left ventricle: The cavity size was normal. Wall thickness was   increased in a pattern of moderate LVH. Systolic function was   mildly reduced. The estimated ejection fraction was in the range   of 45% to 50%. Diffuse hypokinesis. There is severe hypokinesis   of the  basalinferior myocardium. Doppler parameters are   consistent with abnormal left ventricular relaxation (grade 1   diastolic dysfunction). - Mitral valve: Calcified annulus. Mildly thickened leaflets . Impressions: - Mild global hypokinesis with severe hypokinesis of the basal   inferior wall; overall mild LV systolic dysfunction; moderate   LVH; mild diastolic dysfunction.  Bilateral Lower Extremity Duplex  12/05/2017 Negative for DVT     PHYSICAL  EXAM Vitals:   12/05/17 0006 12/05/17 0341 12/05/17 0756 12/05/17 1153  BP: (!) 168/105 (!) 173/109 (!) 166/101 (!) 166/114  Pulse: 62 75 76 64  Resp: 18 20 17 20   Temp: 98.6 F (37 C) 98 F (36.7 C) 97.8 F (36.6 C) 98.6 F (37 C)  TempSrc: Oral Oral Oral Oral  SpO2: 100% 100% 100% 100%    General -  Heart - Regular rate and rhythm - no murmer appreciated Lungs - Clear to auscultation anteriorly Abdomen - Soft - non tender Extremities - Distal pulses intact - no edema Skin - Warm and dry  Mental Status: Awake, alert, oriented to place and people, and month but not to year, thought content appropriate.  Speech fluent without evidence of aphasia.  Able to follow 3 step commands without difficulty. Cranial Nerves: II: Discs not visualized; Visual fields grossly normal, pupils equal, round, reactive to light. III,IV, VI: ptosis not present, extra-ocular motions intact bilaterally V,VII: smile symmetric, facial light touch sensation normal bilaterally VIII: hearing normal bilaterally IX,X: gag reflex present XI: bilateral shoulder shrug intact. XII: midline tongue extension Motor: RUE - 5/5    LUE - 5/5 RLE - 5/5    LLE -  5/5 Tone and bulk:normal tone throughout; no atrophy noted Sensory: Light touch intact throughout, bilaterally Deep Tendon Reflexes: 2+ and symmetric throughout Plantars: Right: downgoing   Left: downgoing Cerebellar: normal finger-to-nose, normal rapid alternating movements and normal heel-to-shin test Gait: not tested   ASSESSMENT/PLAN [name] is a 62 y.o. male with history of previous strokes, hypertension, and hyperlipidemia, presenting with unsteady gait and confusion. He did not receive IV t-PA due to late presentation.  Strokes: left PCA and left MCA/ACA scattered infarcts, etiology unclear  Resultant not fully orientated  CT head - no acute infarct but old left cerebellum, right caudate/BG/IC, left CR, and right MCA/PCA  infarcts  MRI head - Multiple small acute and subacute supratentorial infarcts.  Multiple remote infarcts.  CTA H&N - negative  2D Echo - EF 45-50% but severe hypokinessis of the basalinferior myocardium  Recommend TEE and loop on Monday  Bilateral lower extremity duplex - negative for DVT  LDL 68  HgbA1c 5.7  HIV neg, RPR pending  VTE prophylaxis - Lovenox  No antithrombotic prior to admission, now on aspirin 81 mg daily. Recommend DAPT for 3 weeks and then plavix alone.   Patient counseled to be compliant with his antithrombotic medications  Ongoing aggressive stroke risk factor management  Therapy recommendations: CIR  Disposition:  Pending  Hypertension  Blood pressure runs high but within permissive hypertension parameters. . Permissive hypertension (OK if < 220/120) but gradually normalize in 5-7 days . Long-term BP goal normotensive  Hyperlipidemia  Lipid lowering medication PTA:  none   LDL 68, goal < 70  Current on lipitor 20  Continue statin at discharge  Tobacco abuse  Current smoker  Smoking cessation counseling provided  Nicotine patch provided  Pt is willing to quit  Other Stroke Risk Factors  Advanced age  Obesity  Hx stroke/TIA on imaging  Other Active Problems  Prednisone therapy (?arthritis)  Poor dentition  Elevated creatinine 1.66->1.64   Hospital day # 0  Rosalin Hawking, MD PhD Stroke Neurology 12/05/2017 4:41 PM    To contact Stroke Continuity provider, please refer to [email or web address] After hours, contact General Neurology

## 2017-12-05 NOTE — Progress Notes (Signed)
  Echocardiogram 2D Echocardiogram has been performed.  Rakel Junio L Androw 12/05/2017, 10:33 AM

## 2017-12-05 NOTE — Evaluation (Addendum)
Physical Therapy Evaluation Patient Details Name: Karl Miller MRN: [id number] DOB: Nov 19, 1955 Today's Date: 12/05/2017   History of Present Illness  Pt. is a 62 y.o. M with significant PMH of HTN, elevated PSA, TIA's, tobacco use, who presents with acute encephalopathy and gait disturbance. MRI shows multifocal embolic strokes.  Clinical Impression  Pt admitted with above diagnosis. Pt currently with functional limitations due to the deficits listed below (see PT Problem List).  Patient presenting with decreased functional mobility secondary to diminished balance, cognitive impairments, and right sided weakness. Requiring minimal assistance to ambulate 50 feet with hand held support. Demonstrates a decreased ability to dual task. Patient complaining of lightheadedness but orthostatics stable. Presenting as a fall risk based on decreased gait speed and imbalance. Recommending CIR to maximize functional independence. Suspect patient will progress well based on PLOF and activity tolerance. Pt will benefit from skilled PT to increase their independence and safety with mobility to allow discharge to the venue listed below.       Follow Up Recommendations CIR    Equipment Recommendations  None recommended by PT    Recommendations for Other Services Rehab consult     Precautions / Restrictions Precautions Precautions: Fall Restrictions Weight Bearing Restrictions: No      Mobility  Bed Mobility Overal bed mobility: Needs Assistance Bed Mobility: Supine to Sit     Supine to sit: Supervision        Transfers Overall transfer level: Needs assistance Equipment used: None Transfers: Sit to/from Stand Sit to Stand: Min guard         General transfer comment: wide BOS utilized  Ambulation/Gait Ambulation/Gait assistance: Min Wellsite geologist (Feet): 50 Feet Assistive device: 1 person hand held assist Gait Pattern/deviations: Step-through  pattern;Scissoring;Decreased stride length Gait velocity: decreased   General Gait Details: Patient with occasional scissoring and imbalance requiring up to minimal assistance. Decreased ability to dual task during gait.   Stairs            Wheelchair Mobility    Modified Rankin (Stroke Patients Only) Modified Rankin (Stroke Patients Only) Pre-Morbid Rankin Score: Moderate disability Modified Rankin: Moderately severe disability     Balance Overall balance assessment: Needs assistance Sitting-balance support: Feet supported;No upper extremity supported Sitting balance-Leahy Scale: Good     Standing balance support: No upper extremity supported;During functional activity Standing balance-Leahy Scale: Fair Standing balance comment: needs increased assistance for dynamic balance                             Pertinent Vitals/Pain Pain Assessment: No/denies pain    Home Living Family/patient expects to be discharged to:: Private residence Living Arrangements: Parent Available Help at Discharge: Family Type of Home: House Home Access: Level entry     Home Layout: One level Home Equipment: None Additional Comments: Patient states he has a "crutch," he uses but unsure if he means a cane or an actual crutch.    Prior Function Level of Independence: Independent with assistive device(s)         Comments: uses a "crutch," occasionally. Enjoys taking his dog for walks.     Hand Dominance        Extremity/Trunk Assessment   Upper Extremity Assessment Upper Extremity Assessment: Defer to OT evaluation    Lower Extremity Assessment Lower Extremity Assessment: RLE deficits/detail;LLE deficits/detail RLE Deficits / Details: Grossly 4/5 except ankle dorsiflexion and plantarflexion 5/5 LLE Deficits / Details: [facility]  Communication   Communication: No difficulties  Cognition Arousal/Alertness: Awake/alert Behavior During Therapy: WFL for tasks  assessed/performed Overall Cognitive Status: Impaired/Different from baseline Area of Impairment: Orientation                 Orientation Level: Disoriented to;Time             General Comments: States year is [birth year]. Follows commands consistently.      General Comments      Exercises     Assessment/Plan    PT Assessment Patient needs continued PT services  PT Problem List Decreased strength;Decreased activity tolerance;Decreased balance;Decreased mobility;Decreased coordination;Decreased cognition;Decreased safety awareness       PT Treatment Interventions DME instruction;Gait training;Stair training;Functional mobility training;Therapeutic exercise;Therapeutic activities;Balance training;Patient/family education    PT Goals (Current goals can be found in the Care Plan section)  Acute Rehab PT Goals Patient Stated Goal: get back to walking his dog PT Goal Formulation: With patient Time For Goal Achievement: 12/19/17 Potential to Achieve Goals: Good    Frequency Min 4X/week   Barriers to discharge        Co-evaluation               AM-PAC PT "6 Clicks" Daily Activity  Outcome Measure Difficulty turning over in bed (including adjusting bedclothes, sheets and blankets)?: None Difficulty moving from lying on back to sitting on the side of the bed? : A Little Difficulty sitting down on and standing up from a chair with arms (e.g., wheelchair, bedside commode, etc,.)?: A Little Help needed moving to and from a bed to chair (including a wheelchair)?: A Little Help needed walking in hospital room?: A Little Help needed climbing 3-5 steps with a railing? : A Lot 6 Click Score: 18    End of Session Equipment Utilized During Treatment: Gait belt Activity Tolerance: Patient tolerated treatment well Patient left: in chair;with call bell/phone within reach;with chair alarm set Nurse Communication: Mobility status PT Visit Diagnosis: Unsteadiness on feet  (R26.81);Difficulty in walking, not elsewhere classified (R26.2)    Time: 1342-1400 PT Time Calculation (min) (ACUTE ONLY): 18 min   Charges:   PT Evaluation $PT Eval Moderate Complexity: 1 Mod     PT G Codes:        Ellamae Sia, PT, DPT Acute Rehabilitation Services  Pager: [place] 12/05/2017, 3:29 PM

## 2017-12-05 NOTE — Progress Notes (Signed)
Inpatient Rehabilitation  Per PT request, patient was screened by Luetta Piazza for appropriateness for an Inpatient Acute Rehab consult.  At this time we are recommending an Inpatient Rehab consult.  Please order if you are agreeable.    Cindy Fullman, M.A., CCC/SLP Admission Coordinator  [facility]  Cell [phone]  

## 2017-12-06 DIAGNOSIS — E785 Hyperlipidemia, unspecified: Secondary | ICD-10-CM

## 2017-12-06 DIAGNOSIS — N400 Enlarged prostate without lower urinary tract symptoms: Secondary | ICD-10-CM

## 2017-12-06 DIAGNOSIS — A539 Syphilis, unspecified: Secondary | ICD-10-CM

## 2017-12-06 DIAGNOSIS — N289 Disorder of kidney and ureter, unspecified: Secondary | ICD-10-CM

## 2017-12-06 LAB — BASIC METABOLIC PANEL
ANION GAP: 7 (ref 5–15)
BUN: 12 mg/dL (ref 6–20)
CALCIUM: 9.4 mg/dL (ref 8.9–10.3)
CHLORIDE: 106 mmol/L (ref 101–111)
CO2: 27 mmol/L (ref 22–32)
CREATININE: 1.3 mg/dL — AB (ref 0.61–1.24)
GFR calc Af Amer: >60 mL/min (ref >60)
GFR calc non Af Amer: 56 mL/min — ABNORMAL LOW (ref >60)
Glucose, Bld: 96 mg/dL (ref 65–99)
Potassium: 3.8 mmol/L (ref 3.5–5.1)
Sodium: 140 mmol/L (ref 135–145)

## 2017-12-06 LAB — CBC
HCT: 35.4 % — ABNORMAL LOW (ref 39.0–52.0)
Hemoglobin: 11.5 g/dL — ABNORMAL LOW (ref 13.0–17.0)
MCH: 28.5 pg (ref 26.0–34.0)
MCHC: 32.5 g/dL (ref 30.0–36.0)
MCV: 87.8 fL (ref 78.0–100.0)
PLATELETS: 247 10*3/uL (ref 150–400)
RBC: 4.03 MIL/uL — AB (ref 4.22–5.81)
RDW: 14.4 % (ref 11.5–15.5)
WBC: 5.5 10*3/uL (ref 4.0–10.5)

## 2017-12-06 LAB — RPR, QUANT+TP ABS (REFLEX): T Pallidum Abs: POSITIVE — AB

## 2017-12-06 LAB — RPR: RPR: REACTIVE — AB

## 2017-12-06 NOTE — Evaluation (Signed)
Occupational Therapy Evaluation Patient Details Name: Karl Miller MRN: [id number] DOB: [birth year] Today's Date: 12/06/2017    History of Present Illness Pt. is a 62 y.o. M with significant PMH of HTN, elevated PSA, TIA's, tobacco use, who presents with acute encephalopathy and gait disturbance. MRI shows multifocal embolic strokes.   Clinical Impression   Pt admitted with the above diagnoses and presents with below problem list. Pt will benefit from continued acute OT to address the below listed deficits and maximize independence with basic ADLs prior to d/c to next venue. PTA pt was mod I with functional mobility/transfers (per his report), unclear if he needed assistance with ADLs. He lives with his mother. Pt is currently min to mod A with ADLs. Tolerated session well.       Follow Up Recommendations  CIR    Equipment Recommendations  Other (comment)(defer to next venue)    Recommendations for Other Services Rehab consult     Precautions / Restrictions Precautions Precautions: Fall Restrictions Weight Bearing Restrictions: No      Mobility Bed Mobility               General bed mobility comments: up in chair  Transfers Overall transfer level: Needs assistance Equipment used: Rolling walker (2 wheeled) Transfers: Sit to/from [facility] Sit to Stand: Min guard;Min assist Stand pivot transfers: Min guard;Min assist       General transfer comment: Min A on initial stand from recliner. Steadier with rw. Pt completed multiple sit<>stands throughout session. Cues for technique with rw    Balance Overall balance assessment: Needs assistance Sitting-balance support: Feet supported;No upper extremity supported Sitting balance-Leahy Scale: Good     Standing balance support: No upper extremity supported;During functional activity Standing balance-Leahy Scale: Fair Standing balance comment: needs increased assistance for dynamic balance                            ADL either performed or assessed with clinical judgement   ADL Overall ADL's : Needs assistance/impaired Eating/Feeding: Set up;Sitting   Grooming: Min guard;Standing;Minimal assistance;Applying deodorant;Brushing hair   Upper Body Bathing: Set up;Sitting   Lower Body Bathing: Sit to/from stand;Moderate assistance   Upper Body Dressing : Sitting;Moderate assistance   Lower Body Dressing: Moderate assistance;Sit to/from stand   Toilet Transfer: Min guard;Ambulation;RW   Toileting- Clothing Manipulation and Hygiene: Moderate assistance Toileting - Clothing Manipulation Details (indicate cue type and reason): decreased awareness/concern with urine spillage while attempting to stand to urinate into toilet. Tub/ Shower Transfer: Minimal assistance   Functional mobility during ADLs: Min guard;Minimal assistance General ADL Comments: Pt completed functional mobility in the room, stood to urinate in bathroom, grooming and LB/ADL cleaning. Assist for balance strength and cognitive deficits.     Vision         Perception     Praxis      Pertinent Vitals/Pain Pain Assessment: No/denies pain     Hand Dominance     Extremity/Trunk Assessment Upper Extremity Assessment Upper Extremity Assessment: Generalized weakness;Overall [facility] for tasks assessed   Lower Extremity Assessment Lower Extremity Assessment: Defer to PT evaluation       Communication Communication Communication: No difficulties   Cognition Arousal/Alertness: Awake/alert Behavior During Therapy: WFL for tasks assessed/performed Overall Cognitive Status: Impaired/Different from baseline Area of Impairment: Orientation;Problem solving;Memory;Safety/judgement                 Orientation Level: Disoriented to;Time;Place;Situation   Memory: Decreased  recall of precautions;Decreased short-term memory   Safety/Judgement: Decreased awareness of safety;Decreased awareness of  deficits   Problem Solving: Slow processing General Comments: difficulty following multiple step commands.   General Comments       Exercises     Shoulder Instructions      Home Living Family/patient expects to be discharged to:: Private residence Living Arrangements: Parent Available Help at Discharge: Family Type of Home: House Home Access: Level entry     Home Layout: One level               [place]: None   Additional Comments: Patient states he has a "crutch," he uses but unsure if he means a cane or an actual crutch.  Lives With: Family(mother)    Prior Functioning/Environment Level of Independence: Independent with assistive device(s)        Comments: uses a "crutch," occasionally. Enjoys taking his dog for walks.        OT Problem List: Decreased activity tolerance;Impaired balance (sitting and/or standing);Decreased cognition;Decreased safety awareness;Decreased knowledge of use of DME or AE;Decreased knowledge of precautions      OT Treatment/Interventions: Self-care/ADL training;DME and/or AE instruction;Therapeutic activities;Patient/family education;Balance training    OT Goals(Current goals can be found in the care plan section) Acute Rehab OT Goals Patient Stated Goal: get back to walking his dog OT Goal Formulation: With patient Time For Goal Achievement: 12/20/17 Potential to Achieve Goals: Good ADL Goals Pt Will Perform Grooming: with modified independence;standing Pt Will Perform Upper Body Bathing: with set-up;sitting;with supervision Pt Will Perform Lower Body Bathing: sit to/from stand;with supervision Pt Will Perform Upper Body Dressing: with set-up;sitting Pt Will Perform Lower Body Dressing: with supervision;sit to/from stand Pt Will Transfer to Toilet: with modified independence;ambulating Pt Will Perform Toileting - Clothing Manipulation and hygiene: with supervision;sit to/from stand Pt Will Perform Tub/Shower Transfer:  with supervision;ambulating;rolling walker  OT Frequency: Min 2X/week   Barriers to D/C:            Co-evaluation              AM-PAC PT "6 Clicks" Daily Activity     Outcome Measure Help from another person eating meals?: None Help from another person taking care of personal grooming?: A Little Help from another person toileting, which includes using toliet, bedpan, or urinal?: A Lot Help from another person bathing (including washing, rinsing, drying)?: A Lot Help from another person to put on and taking off regular upper body clothing?: A Little Help from another person to put on and taking off regular lower body clothing?: A Lot 6 Click Score: 16   End of Session Equipment Utilized During Treatment: Gait belt;Rolling walker  Activity Tolerance: Patient tolerated treatment well Patient left: in chair;with call bell/phone within reach;with nursing/sitter in room  OT Visit Diagnosis: Unsteadiness on feet (R26.81);Muscle weakness (generalized) (M62.81);Other symptoms and signs involving cognitive function                Time: 2330-0762 OT Time Calculation (min): 28 min Charges:  OT General Charges $OT Visit: 1 Visit OT Evaluation $OT Eval Low Complexity: 1 Low OT Treatments $Self Care/Home Management : 8-22 mins G-Codes:       Hortencia Pilar 12/06/2017, 2:44 PM

## 2017-12-06 NOTE — Plan of Care (Signed)
Pt alert.  Oriented to person only.  F/C and MAE x 4.  On RA without any s/s of respiratory distress.  Falls have been prevented by bed alarm and sitter.  Pt mobilizes q shift.

## 2017-12-06 NOTE — Evaluation (Signed)
Speech Language Pathology Evaluation Patient Details Name: Karl Miller MRN: [id number] DOB: 09/27/1955 Today's Date: 12/06/2017 Time: 1884-1660 SLP Time Calculation (min) (ACUTE ONLY): 11 min  Problem List:  Patient Active Problem List   Diagnosis Date Noted  . Cerebral embolism with cerebral infarction 12/05/2017  . Smoker   . Acute encephalopathy 12/04/2017  . HTN (hypertension) 12/04/2017  . AKI (acute kidney injury) ([place]) 12/04/2017  . Elevated PSA 12/04/2017  . Rhabdomyolysis 12/04/2017   Past Medical History:  Past Medical History:  Diagnosis Date  . Acute encephalopathy 12/03/2017   [name] 12/04/2017  . High cholesterol   . Hypertension   . Stroke ([place])   . TIA (transient ischemic attack) 12/03/2017   [name] 12/04/2017   Past Surgical History:  Past Surgical History:  Procedure Laterality Date  . NO PAST SURGERIES     HPI:  Pt. is a 62 y.o. M with significant PMH of HTN, elevated PSA, TIA's, tobacco use, who presents with acute encephalopathy and gait disturbance. MRI shows multifocal embolic strokes.   Assessment / Plan / Recommendation Clinical Impression  Pt is disoriented to time and recalls only 50% accurately on delayed recall task despite Max cues. His sustained attention, intellectual awareness, and simple problem solving are impaired, and he tries impulsively to get out of the bed without assistance. Recommend additional SLP f/u acutely and at CIR to maximize functional independence and safety.     SLP Assessment  SLP Recommendation/Assessment: Patient needs continued Speech Lanaguage Pathology Services SLP Visit Diagnosis: Cognitive communication deficit (R41.841)    Follow Up Recommendations  Inpatient Rehab    Frequency and Duration min 2x/week  2 weeks      SLP Evaluation Cognition  Overall Cognitive Status: Impaired/Different from baseline Arousal/Alertness: Awake/alert Orientation Level: Oriented to person;Oriented to place;Oriented to  situation;Disoriented to time Attention: Sustained Sustained Attention: Impaired Sustained Attention Impairment: Verbal basic Memory: Impaired Memory Impairment: Storage deficit;Retrieval deficit;Decreased recall of new information Awareness: Impaired Awareness Impairment: Intellectual impairment Problem Solving: Impaired Problem Solving Impairment: Verbal basic Behaviors: Impulsive Safety/Judgment: Impaired       Comprehension  Auditory Comprehension Overall Auditory Comprehension: Appears within functional limits for tasks assessed(with simple commands)    Expression Expression Primary Mode of Expression: Verbal Verbal Expression Overall Verbal Expression: Appears within functional limits for tasks assessed   Oral / Motor  Motor Speech Overall Motor Speech: Appears within functional limits for tasks assessed   GO                    Germain Osgood 12/06/2017, 12:37 PM  Germain Osgood, M.A. CCC-SLP [phone]

## 2017-12-06 NOTE — Progress Notes (Addendum)
   Subjective: Patient was confused overnight trying to get out of bed and required a sitter. When seen on rounds this morning, he was calming eating breakfast. Reports he feels well and has no complaints. Worked with PT yesterday, still feels unsteady on his feet but denies any more falls.   Objective:  Vital signs in last 24 hours: Vitals:   12/05/17 1153 12/05/17 2018 12/06/17 0014 12/06/17 0500  BP: (!) 166/114 (!) 173/110 (!) 183/118 (!) 160/95  Pulse: 64 67 84 64  Resp: 20 18  20   Temp: 98.6 F (37 C) 97.6 F (36.4 C) 98.3 F (36.8 C) 98 F (36.7 C)  TempSrc: Oral Oral Oral Oral  SpO2: 100% 100% 100% 100%   Physical Exam Constitutional: NAD, appears comfortable Cardiovascular: RRR, no murmurs, rubs, or gallops.  Pulmonary/Chest: CTAB, no wheezes, rales, or rhonchi.  Extremities: Warm and well perfused. No edema.  Neurological: Alert and oriented to person and place, believes it is 2020. CN II - XII grossly intact. Moving all extremities spontaneously.    Assessment/Plan:  Acute Embolic CVA: Patient presented with AMS and gait disturbances resulting in multiple falls who presented for evaluation on 5/27. Metabolic work up in the ED was unrevealing. CT head was notable for prior infarcts and small vessel disease with follow up MRI demonstrating multiple small acute and subacute supratentorial infarcts, likely embolic in nature. Neurology has been consulted and work up initiated. CTA head and neck negative for large vessel occlusion. Echocardiogram yesterday failed to demonstrate and embolic source. He also had lower extremity dopplers that were negative for DVT. Tele has failed to capture arrhythmia thus far. Neurology ha requested TEE and loop recorder to be placed on Monday. PT consult is recommending CIR.  -- Continue DAPT x 3 weeks the Plavix alone -- Continue Lipitor 20 mg -- Consult to CIR -- Plan for TEE and loop recorder Monday  -- Continue telemetry  -- Permissive HTN  with gradual normalization 5-7 days  Renal Dysfunction: Unclear baseline, possible component of AKI from rhabdomyolysis given elevated CK on admission. He has been receiving IVF maintenance fluids that were stopped yesterday. Creatinine is improved today, 1.3 from 1.6. He also received a contrast load for his CTA head and neck on 5/19. Will continue to monitor BMPs daily.  -- Follow BMPs  BPH Elevated PSA -- Scheduled for outpatient biopsy -- Continue finasteride 5 mg daily   FEN: No fluids, replete lytes prn, HH diet VTE ppx: Lovenox  Code Status: FULL  Dispo: Anticipated discharge pending TEE and loop recorder Monday as well as CIR consult for placement.   Velna Ochs, MD 12/06/2017, 7:09 AM Pager: [phone]

## 2017-12-06 NOTE — H&P (View-Only) (Signed)
STROKE TEAM PROGRESS NOTE   SUBJECTIVE (INTERVAL HISTORY) His brother is at the bedside.  Pt is sitting in chair, sitter at bedside. Pt confused overnight trying to get out of bed and required a sitter. Now clam and cooperative. Pending TEE and loop.   OBJECTIVE Temp:  [97.6 F (36.4 C)-98.3 F (36.8 C)] 98.2 F (36.8 C) (05/19 0808) Pulse Rate:  [64-84] 81 (05/19 0808) Cardiac Rhythm: Normal sinus rhythm (05/19 1400) Resp:  [17-20] 17 (05/19 0808) BP: (160-183)/(95-118) 165/108 (05/19 0808) SpO2:  [98 %-100 %] 98 % (05/19 0808)  CBC:  Recent Labs  Lab 12/03/17 2244 12/06/17 0552  WBC 7.5 5.5  HGB 12.0* 11.5*  HCT 36.7* 35.4*  MCV 88.0 87.8  PLT 277 322    Basic Metabolic Panel:  Recent Labs  Lab 12/05/17 0405 12/06/17 0552  NA 142 140  K 3.7 3.8  CL 109 106  CO2 26 27  GLUCOSE 90 96  BUN 18 12  CREATININE 1.64* 1.30*  CALCIUM 8.6* 9.4    Lipid Panel:     Component Value Date/Time   CHOL 137 12/05/2017 0405   TRIG 139 12/05/2017 0405   HDL 41 12/05/2017 0405   CHOLHDL 3.3 12/05/2017 0405   VLDL 28 12/05/2017 0405   LDLCALC 68 12/05/2017 0405   HgbA1c:  Lab Results  Component Value Date   HGBA1C 5.7 (H) 12/05/2017   Urine Drug Screen:     Component Value Date/Time   LABOPIA NONE DETECTED 12/04/2017 1102   COCAINSCRNUR NONE DETECTED 12/04/2017 1102   LABBENZ NONE DETECTED 12/04/2017 1102   AMPHETMU NONE DETECTED 12/04/2017 1102   THCU NONE DETECTED 12/04/2017 1102   LABBARB NONE DETECTED 12/04/2017 1102    Alcohol Level     Component Value Date/Time   ETH <10 12/04/2017 0854    IMAGING I have personally reviewed the radiological images below and agree with the radiology interpretations.  Ct Angio Head W Or Wo Contrast Ct Angio Neck W Or Wo Contrast 12/05/2017 IMPRESSION:   CTA NECK 1. Negative CTA of neck with no hemodynamically significant stenosis identified. Mild atherosclerotic change for age.  2. Extremely poor dentition with  innumerable dental caries and periapical lucencies.  3. Emphysema.   CTA HEAD 1. Negative CTA for large vessel occlusion.  2. Moderate atheromatous bilateral V4 stenoses.  3. No other hemodynamically significant or correctable stenosis identified.  4. Distal small vessel atheromatous irregularity.   Dg Chest 2 View 12/04/2017 IMPRESSION: No edema or consolidation.   Ct Head Wo Contrast 12/04/2017 IMPRESSION:  Mild atrophy with multifocal prior infarcts and extensive supratentorial small vessel disease. No acute infarct evident. No mass or hemorrhage. Foci of arterial vascular calcification noted. There is mucosal thickening in several ethmoid air cells. There is probable cerumen in the right external auditory canal.   Mr Brain Wo Contrast 12/04/2017 IMPRESSION:  1. Motion degraded examination. Multiple small acute and subacute supratentorial infarcts.  2. Faint bifrontal flared hyperintense suggesting motion artifact, less likely atypical hypertensive encephalopathy or metabolic disturbance.  3. Old LEFT PICA and RIGHT PCA territory infarcts. Old bilateral basal ganglia and thalamus infarcts.  4. Moderate to severe chronic small vessel ischemic changes.  5. Moderate parenchymal brain volume loss.   Transthoracic Echocardiogram - Left ventricle: The cavity size was normal. Wall thickness was   increased in a pattern of moderate LVH. Systolic function was   mildly reduced. The estimated ejection fraction was in the range   of 45% to 50%. Diffuse  hypokinesis. There is severe hypokinesis   of the basalinferior myocardium. Doppler parameters are   consistent with abnormal left ventricular relaxation (grade 1   diastolic dysfunction). - Mitral valve: Calcified annulus. Mildly thickened leaflets . Impressions: - Mild global hypokinesis with severe hypokinesis of the basal   inferior wall; overall mild LV systolic dysfunction; moderate   LVH; mild diastolic dysfunction.  Bilateral  Lower Extremity Duplex  12/05/2017 Negative for DVT     PHYSICAL EXAM Vitals:   12/05/17 2018 12/06/17 0014 12/06/17 0500 12/06/17 0808  BP: (!) 173/110 (!) 183/118 (!) 160/95 (!) 165/108  Pulse: 67 84 64 81  Resp: 18  20 17   Temp: 97.6 F (36.4 C) 98.3 F (36.8 C) 98 F (36.7 C) 98.2 F (36.8 C)  TempSrc: Oral Oral Oral Oral  SpO2: 100% 100% 100% 98%    General -  Heart - Regular rate and rhythm - no murmer appreciated Lungs - Clear to auscultation anteriorly Abdomen - Soft - non tender Extremities - Distal pulses intact - no edema Skin - Warm and dry  Mental Status: Awake, alert, oriented to place and people, and month but not to year, thought content appropriate.  Speech fluent without evidence of aphasia.  Able to follow 3 step commands without difficulty. Cranial Nerves: II: Discs not visualized; Visual fields grossly normal, pupils equal, round, reactive to light. III,IV, VI: ptosis not present, extra-ocular motions intact bilaterally V,VII: smile symmetric, facial light touch sensation normal bilaterally VIII: hearing normal bilaterally IX,X: gag reflex present XI: bilateral shoulder shrug intact. XII: midline tongue extension Motor: RUE - 5/5    LUE - 5/5 RLE - 5/5    LLE -  5/5 Tone and bulk:normal tone throughout; no atrophy noted Sensory: Light touch intact throughout, bilaterally Deep Tendon Reflexes: 2+ and symmetric throughout Plantars: Right: downgoing   Left: downgoing Cerebellar: normal finger-to-nose, normal rapid alternating movements and normal heel-to-shin test Gait: not tested   ASSESSMENT/PLAN Mr. Daivon Rayos is a 62 y.o. male with history of previous strokes, hypertension, and hyperlipidemia, presenting with unsteady gait and confusion. He did not receive IV t-PA due to late presentation.  Strokes: left PCA and left MCA/ACA scattered infarcts, etiology unclear  Resultant not fully orientated  CT head - no acute infarct but old  left cerebellum, right caudate/BG/IC, left CR, and right MCA/PCA infarcts  MRI head - Multiple small acute and subacute supratentorial infarcts.  Multiple remote infarcts.  CTA H&N - negative  2D Echo - EF 45-50% but severe hypokinessis of the basalinferior myocardium  Pending TEE and loop on Monday  Bilateral lower extremity duplex - negative for DVT  LDL 68  HgbA1c 5.7  HIV neg  VTE prophylaxis - Lovenox  No antithrombotic prior to admission, now on aspirin 81 mg daily. Recommend DAPT for 3 weeks and then plavix alone.   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 Permissive hypertension (OK if < 220/120) but gradually normalize in 5-7 days . Long-term BP goal normotensive  Hyperlipidemia  Lipid lowering medication PTA:  none   LDL 68, goal < 70  Currently on lipitor 20  Continue statin at discharge  Tobacco abuse  Current smoker  Smoking cessation counseling provided  Nicotine patch provided  Pt is willing to quit  Other Stroke Risk Factors  Advanced age  Obesity  Hx stroke/TIA on imaging  Other Active Problems  Prednisone therapy (?arthritis)  Poor dentition  Elevated creatinine 1.66->1.64   Hospital day # 1  Rosalin Hawking, MD PhD Stroke Neurology 12/06/2017 6:00 PM     To contact Stroke Continuity provider, please refer to [email or web address] After hours, contact General Neurology

## 2017-12-06 NOTE — Progress Notes (Signed)
STROKE TEAM PROGRESS NOTE   SUBJECTIVE (INTERVAL HISTORY) His brother is at the bedside.  Pt is sitting in chair, sitter at bedside. Pt confused overnight trying to get out of bed and required a sitter. Now clam and cooperative. Pending TEE and loop.   OBJECTIVE Temp:  [97.6 F (36.4 C)-98.3 F (36.8 C)] 98.2 F (36.8 C) (05/19 0808) Pulse Rate:  [64-84] 81 (05/19 0808) Cardiac Rhythm: Normal sinus rhythm (05/19 1400) Resp:  [17-20] 17 (05/19 0808) BP: (160-183)/(95-118) 165/108 (05/19 0808) SpO2:  [98 %-100 %] 98 % (05/19 0808)  CBC:  Recent Labs  Lab 12/03/17 2244 12/06/17 0552  WBC 7.5 5.5  HGB 12.0* 11.5*  HCT 36.7* 35.4*  MCV 88.0 87.8  PLT 277 993    Basic Metabolic Panel:  Recent Labs  Lab 12/05/17 0405 12/06/17 0552  NA 142 140  K 3.7 3.8  CL 109 106  CO2 26 27  GLUCOSE 90 96  BUN 18 12  CREATININE 1.64* 1.30*  CALCIUM 8.6* 9.4    Lipid Panel:     Component Value Date/Time   CHOL 137 12/05/2017 0405   TRIG 139 12/05/2017 0405   HDL 41 12/05/2017 0405   CHOLHDL 3.3 12/05/2017 0405   VLDL 28 12/05/2017 0405   LDLCALC 68 12/05/2017 0405   HgbA1c:  Lab Results  Component Value Date   HGBA1C 5.7 (H) 12/05/2017   Urine Drug Screen:     Component Value Date/Time   LABOPIA NONE DETECTED 12/04/2017 1102   COCAINSCRNUR NONE DETECTED 12/04/2017 1102   LABBENZ NONE DETECTED 12/04/2017 1102   AMPHETMU NONE DETECTED 12/04/2017 1102   THCU NONE DETECTED 12/04/2017 1102   LABBARB NONE DETECTED 12/04/2017 1102    Alcohol Level     Component Value Date/Time   ETH <10 12/04/2017 0854    IMAGING I have personally reviewed the radiological images below and agree with the radiology interpretations.  Ct Angio Head W Or Wo Contrast Ct Angio Neck W Or Wo Contrast 12/05/2017 IMPRESSION:   CTA NECK 1. Negative CTA of neck with no hemodynamically significant stenosis identified. Mild atherosclerotic change for age.  2. Extremely poor dentition with  innumerable dental caries and periapical lucencies.  3. Emphysema.   CTA HEAD 1. Negative CTA for large vessel occlusion.  2. Moderate atheromatous bilateral V4 stenoses.  3. No other hemodynamically significant or correctable stenosis identified.  4. Distal small vessel atheromatous irregularity.   Dg Chest 2 View 12/04/2017 IMPRESSION: No edema or consolidation.   Ct Head Wo Contrast 12/04/2017 IMPRESSION:  Mild atrophy with multifocal prior infarcts and extensive supratentorial small vessel disease. No acute infarct evident. No mass or hemorrhage. Foci of arterial vascular calcification noted. There is mucosal thickening in several ethmoid air cells. There is probable cerumen in the right external auditory canal.   Mr Brain Wo Contrast 12/04/2017 IMPRESSION:  1. Motion degraded examination. Multiple small acute and subacute supratentorial infarcts.  2. Faint bifrontal flared hyperintense suggesting motion artifact, less likely atypical hypertensive encephalopathy or metabolic disturbance.  3. Old LEFT PICA and RIGHT PCA territory infarcts. Old bilateral basal ganglia and thalamus infarcts.  4. Moderate to severe chronic small vessel ischemic changes.  5. Moderate parenchymal brain volume loss.   Transthoracic Echocardiogram - Left ventricle: The cavity size was normal. Wall thickness was   increased in a pattern of moderate LVH. Systolic function was   mildly reduced. The estimated ejection fraction was in the range   of 45% to 50%. Diffuse  hypokinesis. There is severe hypokinesis   of the basalinferior myocardium. Doppler parameters are   consistent with abnormal left ventricular relaxation (grade 1   diastolic dysfunction). - Mitral valve: Calcified annulus. Mildly thickened leaflets . Impressions: - Mild global hypokinesis with severe hypokinesis of the basal   inferior wall; overall mild LV systolic dysfunction; moderate   LVH; mild diastolic dysfunction.  Bilateral  Lower Extremity Duplex  12/05/2017 Negative for DVT     PHYSICAL EXAM Vitals:   12/05/17 2018 12/06/17 0014 12/06/17 0500 12/06/17 0808  BP: (!) 173/110 (!) 183/118 (!) 160/95 (!) 165/108  Pulse: 67 84 64 81  Resp: 18  20 17   Temp: 97.6 F (36.4 C) 98.3 F (36.8 C) 98 F (36.7 C) 98.2 F (36.8 C)  TempSrc: Oral Oral Oral Oral  SpO2: 100% 100% 100% 98%    General -  Heart - Regular rate and rhythm - no murmer appreciated Lungs - Clear to auscultation anteriorly Abdomen - Soft - non tender Extremities - Distal pulses intact - no edema Skin - Warm and dry  Mental Status: Awake, alert, oriented to place and people, and month but not to year, thought content appropriate.  Speech fluent without evidence of aphasia.  Able to follow 3 step commands without difficulty. Cranial Nerves: II: Discs not visualized; Visual fields grossly normal, pupils equal, round, reactive to light. III,IV, VI: ptosis not present, extra-ocular motions intact bilaterally V,VII: smile symmetric, facial light touch sensation normal bilaterally VIII: hearing normal bilaterally IX,X: gag reflex present XI: bilateral shoulder shrug intact. XII: midline tongue extension Motor: RUE - 5/5    LUE - 5/5 RLE - 5/5    LLE -  5/5 Tone and bulk:normal tone throughout; no atrophy noted Sensory: Light touch intact throughout, bilaterally Deep Tendon Reflexes: 2+ and symmetric throughout Plantars: Right: downgoing   Left: downgoing Cerebellar: normal finger-to-nose, normal rapid alternating movements and normal heel-to-shin test Gait: not tested   ASSESSMENT/PLAN Karl Miller is a 62 y.o. male with history of previous strokes, hypertension, and hyperlipidemia, presenting with unsteady gait and confusion. He did not receive IV t-PA due to late presentation.  Strokes: left PCA and left MCA/ACA scattered infarcts, etiology unclear  Resultant not fully orientated  CT head - no acute infarct but old  left cerebellum, right caudate/BG/IC, left CR, and right MCA/PCA infarcts  MRI head - Multiple small acute and subacute supratentorial infarcts.  Multiple remote infarcts.  CTA H&N - negative  2D Echo - EF 45-50% but severe hypokinessis of the basalinferior myocardium  Pending TEE and loop on Monday  Bilateral lower extremity duplex - negative for DVT  LDL 68  HgbA1c 5.7  HIV neg  VTE prophylaxis - Lovenox  No antithrombotic prior to admission, now on aspirin 81 mg daily. Recommend DAPT for 3 weeks and then plavix alone.   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 Permissive hypertension (OK if < 220/120) but gradually normalize in 5-7 days . Long-term BP goal normotensive  Hyperlipidemia  Lipid lowering medication PTA:  none   LDL 68, goal < 70  Currently on lipitor 20  Continue statin at discharge  Tobacco abuse  Current smoker  Smoking cessation counseling provided  Nicotine patch provided  Pt is willing to quit  Other Stroke Risk Factors  Advanced age  Obesity  Hx stroke/TIA on imaging  Other Active Problems  Prednisone therapy (?arthritis)  Poor dentition  Elevated creatinine 1.66->1.64   Hospital day # 1  Rosalin Hawking, MD PhD Stroke Neurology 12/06/2017 6:00 PM     To contact Stroke Continuity provider, please refer to [email or web address] After hours, contact General Neurology

## 2017-12-07 ENCOUNTER — Encounter (HOSPITAL_COMMUNITY)
Admission: EM | Disposition: A | Discharge status: Skilled Nursing Facility | Payer: Self-pay | Source: Home / Self Care | Attending: Internal Medicine

## 2017-12-07 ENCOUNTER — Inpatient Hospital Stay (HOSPITAL_COMMUNITY): Payer: Medicaid Other

## 2017-12-07 ENCOUNTER — Encounter (HOSPITAL_COMMUNITY): Payer: Self-pay | Admitting: Cardiology

## 2017-12-07 DIAGNOSIS — I63412 Cerebral infarction due to embolism of left middle cerebral artery: Secondary | ICD-10-CM

## 2017-12-07 DIAGNOSIS — R7303 Prediabetes: Secondary | ICD-10-CM

## 2017-12-07 DIAGNOSIS — N183 Chronic kidney disease, stage 3 unspecified: Secondary | ICD-10-CM

## 2017-12-07 DIAGNOSIS — D62 Acute posthemorrhagic anemia: Secondary | ICD-10-CM

## 2017-12-07 DIAGNOSIS — Z7982 Long term (current) use of aspirin: Secondary | ICD-10-CM

## 2017-12-07 DIAGNOSIS — R2681 Unsteadiness on feet: Secondary | ICD-10-CM

## 2017-12-07 DIAGNOSIS — I6389 Other cerebral infarction: Secondary | ICD-10-CM

## 2017-12-07 DIAGNOSIS — E785 Hyperlipidemia, unspecified: Secondary | ICD-10-CM

## 2017-12-07 DIAGNOSIS — I700 Atherosclerosis of aorta: Secondary | ICD-10-CM

## 2017-12-07 DIAGNOSIS — I10 Essential (primary) hypertension: Secondary | ICD-10-CM

## 2017-12-07 DIAGNOSIS — G459 Transient cerebral ischemic attack, unspecified: Secondary | ICD-10-CM

## 2017-12-07 DIAGNOSIS — Z7902 Long term (current) use of antithrombotics/antiplatelets: Secondary | ICD-10-CM

## 2017-12-07 DIAGNOSIS — I5189 Other ill-defined heart diseases: Secondary | ICD-10-CM

## 2017-12-07 DIAGNOSIS — Z720 Tobacco use: Secondary | ICD-10-CM

## 2017-12-07 HISTORY — PX: LOOP RECORDER INSERTION: EP1214

## 2017-12-07 HISTORY — PX: TEE WITHOUT CARDIOVERSION: SHX5443

## 2017-12-07 LAB — BASIC METABOLIC PANEL
Anion gap: 8 (ref 5–15)
BUN: 15 mg/dL (ref 6–20)
CHLORIDE: 104 mmol/L (ref 101–111)
CO2: 28 mmol/L (ref 22–32)
CREATININE: 1.46 mg/dL — AB (ref 0.61–1.24)
Calcium: 9.2 mg/dL (ref 8.9–10.3)
GFR, EST AFRICAN AMERICAN: 57 mL/min — AB (ref >60)
GFR, EST NON AFRICAN AMERICAN: 49 mL/min — AB (ref >60)
Glucose, Bld: 101 mg/dL — ABNORMAL HIGH (ref 65–99)
POTASSIUM: 3.8 mmol/L (ref 3.5–5.1)
SODIUM: 140 mmol/L (ref 135–145)

## 2017-12-07 LAB — GLUCOSE, CAPILLARY
GLUCOSE-CAPILLARY: 93 mg/dL (ref 65–99)
Glucose-Capillary: 84 mg/dL (ref 65–99)

## 2017-12-07 SURGERY — ECHOCARDIOGRAM, TRANSESOPHAGEAL
Anesthesia: Moderate Sedation

## 2017-12-07 SURGERY — LOOP RECORDER INSERTION
Anesthesia: LOCAL

## 2017-12-07 MED ORDER — MIDAZOLAM HCL 5 MG/ML IJ SOLN
INTRAMUSCULAR | Status: AC
  Filled 2017-12-07: qty 2

## 2017-12-07 MED ORDER — FENTANYL CITRATE (PF) 100 MCG/2ML IJ SOLN
INTRAMUSCULAR | Status: DC | PRN
  Administered 2017-12-07: 25 ug via INTRAVENOUS

## 2017-12-07 MED ORDER — FENTANYL CITRATE (PF) 100 MCG/2ML IJ SOLN
INTRAMUSCULAR | Status: AC
  Filled 2017-12-07: qty 2

## 2017-12-07 MED ORDER — BUTAMBEN-TETRACAINE-BENZOCAINE 2-2-14 % EX AERO
INHALATION_SPRAY | CUTANEOUS | Status: DC | PRN
  Administered 2017-12-07: 2 via TOPICAL

## 2017-12-07 MED ORDER — HYDRALAZINE HCL 20 MG/ML IJ SOLN
10.0000 mg | Freq: Once | INTRAMUSCULAR | Status: AC
  Administered 2017-12-07: 10 mg via INTRAVENOUS

## 2017-12-07 MED ORDER — LIDOCAINE HCL (PF) 1 % IJ SOLN: INTRAMUSCULAR | Status: DC | PRN

## 2017-12-07 MED ORDER — LIDOCAINE-EPINEPHRINE 1 %-1:100000 IJ SOLN
INTRAMUSCULAR | Status: DC | PRN
  Administered 2017-12-07: 20 mL

## 2017-12-07 MED ORDER — LIDOCAINE-EPINEPHRINE 1 %-1:100000 IJ SOLN
INTRAMUSCULAR | Status: AC
  Filled 2017-12-07: qty 1

## 2017-12-07 MED ORDER — MIDAZOLAM HCL 10 MG/2ML IJ SOLN
INTRAMUSCULAR | Status: DC | PRN
  Administered 2017-12-07 (×2): 2 mg via INTRAVENOUS
  Administered 2017-12-07: 1 mg via INTRAVENOUS

## 2017-12-07 MED ORDER — SODIUM CHLORIDE BACTERIOSTATIC 0.9 % IJ SOLN
INTRAMUSCULAR | Status: DC | PRN
  Administered 2017-12-07: 9 mL

## 2017-12-07 MED ORDER — HYDRALAZINE HCL 20 MG/ML IJ SOLN
5.0000 mg | Freq: Four times a day (QID) | INTRAMUSCULAR | Status: DC | PRN
  Filled 2017-12-07: qty 1

## 2017-12-07 MED ORDER — SODIUM CHLORIDE 0.9 % IV SOLN
INTRAVENOUS | Status: DC
  Administered 2017-12-07: 10:00:00 via INTRAVENOUS

## 2017-12-07 MED ORDER — HYDRALAZINE HCL 20 MG/ML IJ SOLN
2.0000 mg | Freq: Once | INTRAMUSCULAR | Status: DC
Start: 2017-12-07 — End: 2017-12-07

## 2017-12-07 MED ORDER — HYDRALAZINE HCL 20 MG/ML IJ SOLN
INTRAMUSCULAR | Status: AC
  Filled 2017-12-07: qty 1

## 2017-12-07 SURGICAL SUPPLY — 2 items
LOOP REVEAL LINQSYS (Prosthesis & Implant Heart) ×2 IMPLANT
PACK LOOP INSERTION (CUSTOM PROCEDURE TRAY) ×2 IMPLANT

## 2017-12-07 NOTE — Progress Notes (Signed)
  Echocardiogram 2D Echocardiogram has been performed.  Karl Miller 12/07/2017, 11:02 AM

## 2017-12-07 NOTE — Care Management Note (Signed)
Case Management Note  Patient Details  Name: Tyrease Vandeberg MRN: [id number] Date of Birth: 1956/03/31  Subjective/Objective:    Pt admitted with CVA. He is from home with his mother.                 Action/Plan: PT/OT recommending CIR. Awaiting CIR MD consult. CM spoke to patients brother, [name] and he would like the patient faxed out for SNF backup. Will update CSW. CM following for d/c disposition.   Expected Discharge Date:                  Expected Discharge Plan:  IP Rehab Facility  In-House Referral:  Clinical Social Work  Discharge planning Services  CM Consult  Post Acute Care Choice:    Choice offered to:     DME Arranged:    DME Agency:     HH Arranged:    [place] Agency:     Status of Service:  In process, will continue to follow  If discussed at Long Length of Stay Meetings, dates discussed:    Additional Comments:  Pollie Friar, RN 12/07/2017, 3:26 PM

## 2017-12-07 NOTE — Progress Notes (Signed)
Physical Therapy Cancellation Note   12/07/17 1049  PT Visit Information  Last PT Received On 12/07/17  Reason Eval/Treat Not Completed Patient at procedure or test/unavailable. Pt off unit for TEE. PT will continue to follow acutely.    Earney Navy, PTA Pager: [phone]

## 2017-12-07 NOTE — Interval H&P Note (Signed)
History and Physical Interval Note:  12/07/2017 10:11 AM  Karl Miller  has presented today for surgery, with the diagnosis of stroke  The various methods of treatment have been discussed with the patient and family. After consideration of risks, benefits and other options for treatment, the patient has consented to  Procedure(s): TRANSESOPHAGEAL ECHOCARDIOGRAM (TEE) (N/A) as a surgical intervention .  The patient's history has been reviewed, patient examined, no change in status, stable for surgery.  I have reviewed the patient's chart and labs.  Questions were answered to the patient's satisfaction.     Ena Dawley

## 2017-12-07 NOTE — Consult Note (Addendum)
ELECTROPHYSIOLOGY CONSULT NOTE  Patient ID: Karl Miller MRN: [id number], DOB/AGE: [birth year]   Admit date: 12/03/2017 Date of Consult: 12/07/2017  Primary Physician: Patient, No Pcp Per Primary Cardiologist: new to [facility] Reason for Consultation: Cryptogenic stroke; recommendations regarding Implantable Loop Recorder  History of Present Illness EP has been asked to evaluate Karl Miller for placement of an implantable loop recorder to monitor for atrial fibrillation by Dr Erlinda Hong.  The patient was admitted on 12/03/2017 with unsteady gait and confusion.  Imaging demonstrated left PCA and left MCA/ACA scattered infarcts felt to be embolic secondary to unknown source.  he has undergone workup for stroke including echocardiogram and carotid dopplers.  The patient has been monitored on telemetry which has demonstrated sinus rhythm with no arrhythmias.  Inpatient stroke work-up is to be completed with a TEE.   Echocardiogram this admission demonstrated EF 45-50%, moderate LVH, diffuse hypokinesis, severe hypokinesis of the basalinferior myocardium.  Lab work is reviewed.  Prior to admission, the patient denies chest pain, shortness of breath, dizziness, palpitations, or syncope.  They are recovering from their stroke with plans to go to CIR at discharge.  Past Medical History:  Diagnosis Date  . Acute encephalopathy 12/03/2017   Karl Miller 12/04/2017  . High cholesterol   . Hypertension   . Stroke ([place])   . TIA (transient ischemic attack) 12/03/2017   Karl Miller 12/04/2017     Surgical History:  Past Surgical History:  Procedure Laterality Date  . NO PAST SURGERIES       Medications Prior to Admission  Medication Sig Dispense Refill Last Dose  . ferrous sulfate 325 (65 FE) MG tablet Take 325 mg by mouth See admin instructions. Take 1 tablet on Monday, Wednesday, And Friday   12/02/2017  . finasteride (PROSCAR) 5 MG tablet Take 5 mg by mouth daily.   12/03/2017 at Unknown time  .  lisinopril-hydrochlorothiazide (PRINZIDE,ZESTORETIC) 20-12.5 MG tablet Take 1 tablet by mouth daily.   12/03/2017 at Unknown time  . methocarbamol (ROBAXIN) 750 MG tablet Take 750 mg by mouth every 8 (eight) hours as needed for muscle spasms.   Unk  . predniSONE (DELTASONE) 20 MG tablet Take 40 mg by mouth daily as needed.   Unk  . senna (SENOKOT) 8.6 MG TABS tablet Take 2 tablets by mouth daily.   Past Week at Unknown time  . vitamin B-12 (CYANOCOBALAMIN) 500 MCG tablet Take 1,000 mcg by mouth daily.   12/03/2017 at Unknown time  . meloxicam (MOBIC) 7.5 MG tablet Take 1 tablet (7.5 mg total) by mouth daily. (Patient not taking: Reported on 12/04/2017) 10 tablet 0 Not Taking at Unknown time    Inpatient Medications:  . aspirin  81 mg Oral Daily  . atorvastatin  20 mg Oral q1800  . clopidogrel  75 mg Oral Daily  . enoxaparin (LOVENOX) injection  40 mg Subcutaneous Q24H  . ferrous sulfate  325 mg Oral Q M,W,F  . finasteride  5 mg Oral Daily  . nicotine  21 mg Transdermal Daily  . ramelteon  8 mg Oral QHS  . sodium chloride flush  3 mL Intravenous Q12H  . vitamin B-12  1,000 mcg Oral Daily    Allergies: No Known Allergies  Social History   Socioeconomic History  . Marital status: Single    Spouse name: Not on file  . Number of children: Not on file  . Years of education: Not on file  . Highest education level: Not on file  Occupational History  .  Not on file  Social Needs  . Financial resource strain: Not on file  . Food insecurity:    Worry: Not on file    Inability: Not on file  . Transportation needs:    Medical: Not on file    Non-medical: Not on file  Tobacco Use  . Smoking status: Current Every Day Smoker    Packs/day: 0.50    Years: 24.00    Pack years: 12.00    Types: Cigarettes  . Smokeless tobacco: Never Used  Substance and Sexual Activity  . Alcohol use: No    Frequency: Never  . Drug use: No  . Sexual activity: Not Currently  Lifestyle  . Physical activity:     Days per week: Not on file    Minutes per session: Not on file  . Stress: Not on file  Relationships  . Social connections:    Talks on phone: Not on file    Gets together: Not on file    Attends religious service: Not on file    Active member of club or organization: Not on file    Attends meetings of clubs or organizations: Not on file    Relationship status: Not on file  . Intimate partner violence:    Fear of current or ex partner: Not on file    Emotionally abused: Not on file    Physically abused: Not on file    Forced sexual activity: Not on file  Other Topics Concern  . Not on file  Social History Narrative  . Not on file     Family History: no premature CAD     Review of Systems: All other systems reviewed and are otherwise negative except as noted above.  Physical Exam: Vitals:   12/06/17 2257 12/07/17 0415 12/07/17 0600 12/07/17 0814  BP: (!) 158/106 (!) 167/110 (!) 158/110 (!) 183/122  Pulse: 76 76  71  Resp: 18 20  16   Temp: 98.1 F (36.7 C) 97.9 F (36.6 C)  98 F (36.7 C)  TempSrc: Oral Oral  Oral  SpO2: 100% 100%  99%  Weight:  182 lb 1.6 oz (82.6 kg)      GEN- The patient is well appearing, alert and oriented x 2 today  Head- normocephalic, atraumatic Eyes-  Sclera clear, conjunctiva pink Ears- hearing intact Oropharynx- clear Neck- supple Lungs- Clear to ausculation bilaterally, normal work of breathing Heart- Regular rate and rhythm  GI- soft, NT, ND, + BS Extremities- no clubbing, cyanosis, or edema MS- no significant deformity or atrophy Skin- no rash or lesion Psych- euthymic mood, full affect   Labs:   Lab Results  Component Value Date   WBC 5.5 12/06/2017   HGB 11.5 (L) 12/06/2017   HCT 35.4 (L) 12/06/2017   MCV 87.8 12/06/2017   PLT 247 12/06/2017    Recent Labs  Lab 12/03/17 2244  12/07/17 0720  NA 144   < > 140  K 4.0   < > 3.8  CL 111   < > 104  CO2 24   < > 28  BUN 20   < > 15  CREATININE 1.66*   < > 1.46*    CALCIUM 9.9   < > 9.2  PROT 7.1  --   --   BILITOT 0.5  --   --   ALKPHOS 51  --   --   ALT 16*  --   --   AST 40  --   --  GLUCOSE 107*   < > 101*   < > = values in this interval not displayed.     Radiology/Studies: Ct Angio Head W Or Wo Contrast  Result Date: 12/05/2017 CLINICAL DATA:  Follow-up examination for acute stroke. EXAM: CT ANGIOGRAPHY HEAD AND NECK TECHNIQUE: Multidetector CT imaging of the head and neck was performed using the standard protocol during bolus administration of intravenous contrast. Multiplanar CT image reconstructions and MIPs were obtained to evaluate the vascular anatomy. Carotid stenosis measurements (when applicable) are obtained utilizing NASCET criteria, using the distal internal carotid diameter as the denominator. CONTRAST:  5mL ISOVUE-370 IOPAMIDOL (ISOVUE-370) INJECTION 76% COMPARISON:  Prior CT and MRI from earlier the same day. FINDINGS: CTA NECK FINDINGS Aortic arch: Visualized aortic arch of normal caliber with normal branch pattern. Mild atheromatous plaque about the arch and origin of the great vessels without hemodynamically significant stenosis. Visualized subclavian arteries widely patent. Right carotid system: Right common and internal carotid arteries are widely patent without stenosis, dissection, or occlusion. No significant atheromatous narrowing about the right carotid bifurcation. Left carotid system: Left common and internal carotid arteries are widely patent without stenosis, dissection, or occlusion. No significant atheromatous narrowing about the left carotid bifurcation. Vertebral arteries: Both of the vertebral arteries arise from the subclavian arteries. Left vertebral artery dominant. Vertebral arteries widely patent within the neck without stenosis, dissection, or occlusion. Skeleton: No acute osseous abnormality. No worrisome lytic or blastic osseous lesions. Mild to moderate cervical spondylolysis at C5-6 and C6-7 without significant  stenosis. Extremely poor dentition with innumerable dental caries and periapical lucencies. No acute inflammatory changes about the dentition at this time. Other neck: No acute soft tissue abnormality within the neck. No adenopathy. Salivary glands within normal limits. Subcentimeter hypodensity within the left thyroid lobe, of doubtful significance. Upper chest: Visualized upper chest within normal limits. Centrilobular emphysema with scattered atelectatic changes noted within the visualized lungs. Review of the MIP images confirms the above findings CTA HEAD FINDINGS Anterior circulation: Petrous segments widely patent bilaterally. Mild scattered atheromatous plaque within the cavernous ICAs without significant stenosis. Supraclinoid segments widely patent. ICA termini widely patent. Dominant right A1 widely patent. Hypoplastic left A1 widely patent as well. Normal anterior communicating artery. Right ACA widely patent to its distal aspect. Focal mild to moderate left A2 stenosis noted (series 8, image 91). M1 segments widely patent without stenosis. No proximal M2 occlusion. MCA branches well perfused and fairly symmetric. Distal small vessel atheromatous irregularity. Posterior circulation: Atheromatous plaque within the V4 segments bilaterally with associated short-segment moderate stenoses. Posterior inferior cerebral arteries patent bilaterally. Basilar artery widely patent to its distal aspect without stenosis. Superior cerebral arteries patent proximally. Both of the PCA supplied via the basilar artery and are well perfused to their distal aspects. Venous sinuses: Patent. Anatomic variants: Hypoplastic left A1 segment. No other significant variant. No aneurysm or other vascular abnormality. Delayed phase: No abnormal enhancement. Review of the MIP images confirms the above findings IMPRESSION: CTA NECK IMPRESSION 1. Negative CTA of neck with no hemodynamically significant stenosis identified. Mild  atherosclerotic change for age. 2. Extremely poor dentition with innumerable dental caries and periapical lucencies. 3. Emphysema. CTA HEAD IMPRESSION 1. Negative CTA for large vessel occlusion. 2. Moderate atheromatous bilateral V4 stenoses. 3. No other hemodynamically significant or correctable stenosis identified. 4. Distal small vessel atheromatous irregularity. Electronically Signed   By: Jeannine Boga M.D.   On: 12/05/2017 00:12   Dg Chest 2 View  Result Date: 12/04/2017 CLINICAL DATA:  Altered mental status EXAM: CHEST - 2 VIEW COMPARISON:  None. FINDINGS: Lungs are clear. Heart size and pulmonary vascularity are normal. No adenopathy. No bone lesions. IMPRESSION: No edema or consolidation. Electronically Signed   By: Lowella Grip III M.D.   On: 12/04/2017 08:58   Ct Head Wo Contrast  Result Date: 12/04/2017 CLINICAL DATA:  Dizziness with recent fall.  Altered mental status. EXAM: CT HEAD WITHOUT CONTRAST TECHNIQUE: Contiguous axial images were obtained from the base of the skull through the vertex without intravenous contrast. COMPARISON:  None. FINDINGS: Brain: There is mild diffuse atrophy. There is no intracranial mass, hemorrhage, extra-axial fluid collection, or midline shift. There is evidence of a prior infarct in the posterior inferior left cerebellar hemisphere. There is evidence of a prior infarct in the medial right occipital lobe. There is evidence of a prior infarct involving the genu and much of the posterior limb of the right internal capsule. There is a focal infarct in the inferior left centrum semiovale immediately superior to the left lentiform nucleus. There is extensive small vessel disease with scattered lacunar infarcts throughout the centra semiovale bilaterally in the supratentorial white matter. There are small infarcts in each medial thalamus. No acute infarct is appreciable on this study. Vascular: No appreciable hyperdense vessel evident. There are scattered  foci of calcification in each distal vertebral artery and carotid siphon regions. Skull: Bony calvarium appears intact. Sinuses/Orbits: There is mucosal thickening in several ethmoid air cells. Visualized paranasal sinuses elsewhere are clear. Orbits appear symmetric bilaterally. Other: Mastoid air cells are clear. There is debris in the right external auditory canal. IMPRESSION: Mild atrophy with multifocal prior infarcts and extensive supratentorial small vessel disease. No acute infarct evident. No mass or hemorrhage. Foci of arterial vascular calcification noted. There is mucosal thickening in several ethmoid air cells. There is probable cerumen in the right external auditory canal. Electronically Signed   By: Lowella Grip III M.D.   On: 12/04/2017 09:01   Ct Angio Neck W Or Wo Contrast  Result Date: 12/05/2017 CLINICAL DATA:  Follow-up examination for acute stroke. EXAM: CT ANGIOGRAPHY HEAD AND NECK TECHNIQUE: Multidetector CT imaging of the head and neck was performed using the standard protocol during bolus administration of intravenous contrast. Multiplanar CT image reconstructions and MIPs were obtained to evaluate the vascular anatomy. Carotid stenosis measurements (when applicable) are obtained utilizing NASCET criteria, using the distal internal carotid diameter as the denominator. CONTRAST:  73mL ISOVUE-370 IOPAMIDOL (ISOVUE-370) INJECTION 76% COMPARISON:  Prior CT and MRI from earlier the same day. FINDINGS: CTA NECK FINDINGS Aortic arch: Visualized aortic arch of normal caliber with normal branch pattern. Mild atheromatous plaque about the arch and origin of the great vessels without hemodynamically significant stenosis. Visualized subclavian arteries widely patent. Right carotid system: Right common and internal carotid arteries are widely patent without stenosis, dissection, or occlusion. No significant atheromatous narrowing about the right carotid bifurcation. Left carotid system: Left  common and internal carotid arteries are widely patent without stenosis, dissection, or occlusion. No significant atheromatous narrowing about the left carotid bifurcation. Vertebral arteries: Both of the vertebral arteries arise from the subclavian arteries. Left vertebral artery dominant. Vertebral arteries widely patent within the neck without stenosis, dissection, or occlusion. Skeleton: No acute osseous abnormality. No worrisome lytic or blastic osseous lesions. Mild to moderate cervical spondylolysis at C5-6 and C6-7 without significant stenosis. Extremely poor dentition with innumerable dental caries and periapical lucencies. No acute inflammatory changes about the dentition at this time. Other  neck: No acute soft tissue abnormality within the neck. No adenopathy. Salivary glands within normal limits. Subcentimeter hypodensity within the left thyroid lobe, of doubtful significance. Upper chest: Visualized upper chest within normal limits. Centrilobular emphysema with scattered atelectatic changes noted within the visualized lungs. Review of the MIP images confirms the above findings CTA HEAD FINDINGS Anterior circulation: Petrous segments widely patent bilaterally. Mild scattered atheromatous plaque within the cavernous ICAs without significant stenosis. Supraclinoid segments widely patent. ICA termini widely patent. Dominant right A1 widely patent. Hypoplastic left A1 widely patent as well. Normal anterior communicating artery. Right ACA widely patent to its distal aspect. Focal mild to moderate left A2 stenosis noted (series 8, image 91). M1 segments widely patent without stenosis. No proximal M2 occlusion. MCA branches well perfused and fairly symmetric. Distal small vessel atheromatous irregularity. Posterior circulation: Atheromatous plaque within the V4 segments bilaterally with associated short-segment moderate stenoses. Posterior inferior cerebral arteries patent bilaterally. Basilar artery widely  patent to its distal aspect without stenosis. Superior cerebral arteries patent proximally. Both of the PCA supplied via the basilar artery and are well perfused to their distal aspects. Venous sinuses: Patent. Anatomic variants: Hypoplastic left A1 segment. No other significant variant. No aneurysm or other vascular abnormality. Delayed phase: No abnormal enhancement. Review of the MIP images confirms the above findings IMPRESSION: CTA NECK IMPRESSION 1. Negative CTA of neck with no hemodynamically significant stenosis identified. Mild atherosclerotic change for age. 2. Extremely poor dentition with innumerable dental caries and periapical lucencies. 3. Emphysema. CTA HEAD IMPRESSION 1. Negative CTA for large vessel occlusion. 2. Moderate atheromatous bilateral V4 stenoses. 3. No other hemodynamically significant or correctable stenosis identified. 4. Distal small vessel atheromatous irregularity. Electronically Signed   By: Jeannine Boga M.D.   On: 12/05/2017 00:12   Mr Brain Wo Contrast  Result Date: 12/04/2017 CLINICAL DATA:  Acute encephalopathy and gait disturbance. History of hypertension. EXAM: MRI HEAD WITHOUT CONTRAST TECHNIQUE: Multiplanar, multiecho pulse sequences of the brain and surrounding structures were obtained without intravenous contrast. COMPARISON:  CT HEAD Dec 04, 2017 FINDINGS: Multiple sequences are mildly or moderately motion degraded. INTRACRANIAL CONTENTS: Punctate reduced diffusion LEFT temporal and LEFT occipital lobes, too small to localized on ADC map. 12 mm reduced diffusion posterior LEFT splenium of corpus callosum with normalized ADC values. Punctate LEFT frontal white matter reduced diffusion, 1 of which demonstrates low ADC values. Numerous chronic micro hemorrhages in central and peripheral distribution. Susceptibility artifact associated with old bilateral basal ganglia infarcts. Old bilateral mesial thalamus infarcts. LEFT inferior cerebellar encephalomalacia.  Small area RIGHT mesial occipital lobe encephalomalacia. Faint frontal FLAIR T2 hyperintense signal. Old RIGHT pontine lacunar infarct. Patchy to confluent supratentorial white matter FLAIR T2 hyperintensities with multiple cystic infarct. Moderate parenchymal brain volume loss. No hydrocephalus. No midline shift, mass effect or masses. No abnormal extra-axial fluid collections. VASCULAR: Normal major intracranial vascular flow voids present at skull base. SKULL AND UPPER CERVICAL SPINE: No abnormal sellar expansion. No suspicious calvarial bone marrow signal. Craniocervical junction maintained. LEFT frontal scalp sebaceous cyst. SINUSES/ORBITS: Trace paranasal sinus mucosal thickening without air-fluid levels. Mastoid air cells are well aerated.The included ocular globes and orbital contents are non-suspicious. OTHER: None. IMPRESSION: 1. Motion degraded examination. Multiple small acute and subacute supratentorial infarcts. 2. Faint bifrontal flared hyperintense suggesting motion artifact, less likely atypical hypertensive encephalopathy or metabolic disturbance. 3. Old LEFT PICA and RIGHT PCA territory infarcts. Old bilateral basal ganglia and thalamus infarcts. 4. Moderate to severe chronic small vessel ischemic changes. 5.  Moderate parenchymal brain volume loss. Electronically Signed   By: Elon Alas M.D.   On: 12/04/2017 16:46    12-lead ECG SR, LVH (personally reviewed) All prior EKG's in EPIC reviewed with no documented atrial fibrillation  Telemetry sinus rhythm, 4 beat run NCT (personally reviewed)  Assessment and Plan:  1. Cryptogenic stroke The patient presents with cryptogenic stroke.  The patient has a TEE planned for later today.  Dr Curt Bears spoke at length with the patient about monitoring for afib with an implantable loop recorder.  Risks, benefits, and alteratives to TEE and implantable loop recorder were discussed with the patient today.   At this time, the patient is very clear  in their decision to proceed with implantable loop recorder.   Wound care was reviewed with the patient (keep incision clean and dry for 3 days).  Wound check scheduled and entered in AVS.  Please call with questions.   Chanetta Marshall, NP 12/07/2017 8:31 AM  I have seen and examined this patient with Chanetta Marshall.  Agree with above, note added to reflect my findings.  On exam, RRR, no murmurs, lungs clear. Post cryptogenic stroke. Workup negative thus far. Plan for LINQ implant for AF monitoring. Risks include bleeding and infection. The patient understands the risks and has agreed to the procedure.    Jaki Steptoe M. Winnie Barsky MD 12/07/2017 11:25 AM

## 2017-12-07 NOTE — Plan of Care (Signed)

## 2017-12-07 NOTE — NC FL2 (Signed)
[place] LEVEL OF CARE SCREENING TOOL     IDENTIFICATION  Patient Name: Karl Miller Birthdate: 03/23/1956 Sex: male Admission Date (Current Location): 12/03/2017  [facility] and [place] Number:  Herbalist and Address:  [facility] [place] [place] [address] [place], [place] [address]      Provider Number: [id number]  Attending Physician Name and Address:  Axel Filler, *  Relative Name and Phone Number:       Current Level of Care: Hospital Recommended Level of Care: [place] Prior Approval Number:    Date Approved/Denied:   PASRR Number: [id number]  Discharge Plan: SNF    Current Diagnoses: Patient Active Problem List   Diagnosis Date Noted  . Hyperlipidemia   . Syphilis   . Cerebral embolism with cerebral infarction 12/05/2017  . Smoker   . Acute encephalopathy 12/04/2017  . HTN (hypertension) 12/04/2017  . AKI (acute kidney injury) ([place]) 12/04/2017  . Elevated PSA 12/04/2017  . Rhabdomyolysis 12/04/2017    Orientation RESPIRATION BLADDER Height & Weight     Self  Normal Incontinent Weight: 182 lb (82.6 kg) Height:  5\' 9"  (175.3 cm)  BEHAVIORAL SYMPTOMS/MOOD NEUROLOGICAL BOWEL NUTRITION STATUS      Continent Diet(heart healthy)  AMBULATORY STATUS COMMUNICATION OF NEEDS Skin   Extensive Assist Verbally Normal                       Personal Care Assistance Level of Assistance  Bathing, Feeding, Dressing Bathing Assistance: Maximum assistance Feeding assistance: Limited assistance Dressing Assistance: Maximum assistance     Functional Limitations Info  Sight, Hearing, Speech Sight Info: Impaired Hearing Info: Adequate Speech Info: Adequate    SPECIAL CARE FACTORS FREQUENCY  PT (By licensed PT), OT (By licensed OT)     PT Frequency: 5x/wk OT Frequency: 5x/wk            Contractures Contractures Info: Not present    Additional Factors Info  Code Status, Allergies Code  Status Info: Full Allergies Info: NKA           Current Medications (12/07/2017):  This is the current hospital active medication list Current Facility-Administered Medications  Medication Dose Route Frequency Provider Last Rate Last Dose  . acetaminophen (TYLENOL) tablet 650 mg  650 mg Oral Q6H PRN Jule Ser, DO       Or  . acetaminophen (TYLENOL) suppository 650 mg  650 mg Rectal Q6H PRN Jule Ser, DO      . aspirin chewable tablet 81 mg  81 mg Oral Daily Jule Ser, DO   81 mg at 12/07/17 0824  . atorvastatin (LIPITOR) tablet 20 mg  20 mg Oral q1800 Rosalin Hawking, MD   20 mg at 12/06/17 1701  . clopidogrel (PLAVIX) tablet 75 mg  75 mg Oral Daily Rosalin Hawking, MD   75 mg at 12/07/17 0824  . enoxaparin (LOVENOX) injection 40 mg  40 mg Subcutaneous Q24H Jule Ser, DO   40 mg at 12/06/17 1701  . fentaNYL (SUBLIMAZE) 100 MCG/2ML injection           . ferrous sulfate tablet 325 mg  325 mg Oral Q M,W,F Jule Ser, DO   325 mg at 12/07/17 0825  . finasteride (PROSCAR) tablet 5 mg  5 mg Oral Daily Jule Ser, DO   5 mg at 12/07/17 0825  . nicotine (NICODERM CQ - dosed in mg/24 hours) patch 21 mg  21 mg Transdermal Daily  Thomasene Ripple, MD   21 mg at 12/07/17 0825  . nicotine polacrilex (NICORETTE) gum 2 mg  2 mg Oral PRN Nedrud, Larena Glassman, MD      . polyethylene glycol (MIRALAX / GLYCOLAX) packet 17 g  17 g Oral Daily PRN Jule Ser, DO      . ramelteon (ROZEREM) tablet 8 mg  8 mg Oral QHS Tawny Asal, MD   8 mg at 12/06/17 2157  . sodium chloride flush (NS) 0.9 % injection 3 mL  3 mL Intravenous Q12H Jule Ser, DO   3 mL at 12/06/17 2158  . vitamin B-12 (CYANOCOBALAMIN) tablet 1,000 mcg  1,000 mcg Oral Daily Jule Ser, DO   1,000 mcg at 12/07/17 4239     Discharge Medications: Please see discharge summary for a list of discharge medications.  Relevant Imaging Results:  Relevant Lab Results:   Additional Information SS#:  [id number]  [name], LCSW

## 2017-12-07 NOTE — Discharge Summary (Addendum)
Name: Early Steel MRN: [id number] DOB: May 28, 1956 62 y.o. PCP: Patient, No Pcp Per (to be established by SNF social work)  Date of Admission: 12/03/2017 10:29 PM Date of Discharge: 12/09/17 Attending Physician: Axel Filler, *  Discharge Diagnosis:  Principal Problem:   Acute encephalopathy secondary to acute and subacure supratentorial infarcts  Active Problems:   HTN (hypertension)   AKI (acute kidney injury) ([place])   Elevated PSA   Rhabdomyolysis   Cerebral embolism with cerebral infarction   Smoker   Hyperlipidemia   Syphilis   Discharge Medications: Allergies as of 12/09/2017   No Known Allergies     Medication List    STOP taking these medications   predniSONE 20 MG tablet Commonly known as:  DELTASONE     TAKE these medications   aspirin 81 MG chewable tablet Chew 1 tablet (81 mg total) by mouth daily for 17 days. Start taking on:  12/10/2017   atorvastatin 20 MG tablet Commonly known as:  LIPITOR Take 1 tablet (20 mg total) by mouth daily at 6 PM.   clopidogrel 75 MG tablet Commonly known as:  PLAVIX Take 1 tablet (75 mg total) by mouth daily. Start taking on:  12/10/2017   ferrous sulfate 325 (65 FE) MG tablet Take 325 mg by mouth See admin instructions. Take 1 tablet on Monday, Wednesday, And Friday   finasteride 5 MG tablet Commonly known as:  PROSCAR Take 5 mg by mouth daily.   lisinopril-hydrochlorothiazide 20-12.5 MG tablet Commonly known as:  PRINZIDE,ZESTORETIC Take 1 tablet by mouth daily.   meloxicam 7.5 MG tablet Commonly known as:  MOBIC Take 1 tablet (7.5 mg total) by mouth daily.   methocarbamol 750 MG tablet Commonly known as:  ROBAXIN Take 750 mg by mouth every 8 (eight) hours as needed for muscle spasms.   senna 8.6 MG Tabs tablet Commonly known as:  SENOKOT Take 2 tablets by mouth daily.   vitamin B-12 500 MCG tablet Commonly known as:  CYANOCOBALAMIN Take 1,000 mcg by mouth daily.       Disposition  and follow-up:   Mr.Koichi Mian was discharged from [facility] in stable condition.  At the hospital follow up visit please address:  1.  Acute encephalopathy: the patient's encephalopathy was thought to be due to subacute/acute infarcts and hospital delirium. Please ensure that the patient is continually redirected while at SNF. Please also make sure that the patient has a primary care physician who can continually monitor the patient's mental status.   Cerebral embolism with cerebral infarction in supratentorial area: Please ensure that the patient takes atorvastatin 20mg  qd and DAPT (apirin 81 and plavix 75) both once daily for 3 weeks (12/27/17) and then plavix thereafter. The patient should also follow with [facility] neurology.   2.  Labs / imaging needed at time of follow-up: none  3.  Pending labs/ test needing follow-up: none  Follow-up Appointments:  Contact information for follow-up providers    [facility] Follow up in 4 week(s).   Specialty:  Neurology Why:  stroke clinic. office will call with appt date and time. Contact information: [address] [place] [phone]           Contact information for after-discharge care    Destination    [facility] .   Service:  Skilled Chiropodist information: [address] [place] [phone]  Hospital Course by problem list:   Cerebral embolism with cerebral infarction in supratentorial area The patient presented with acute encephalopathy and gait disturbance. CT head didn't show any acute infarct. CTA head and neck did not show any significant large vessel occlusion but some distal small vessel atheromatous irregularity. MRI found multiple small acute and subacute supratentorial infarcts and old left pica and right pca infarct along with old bilateral basal ganglia and thalamus  infarcts. The distributed territory of the infarcts caused concern for embolic stroke which prompted further workup. TEE did not find any embolic source, but there was a moderate sized atheroma visualized in descending aorta. Patient got loop recorder placed to monitor for atrial fibrillation. He did not have any notable neurological deficits on exam the day of discharge.   The patient was started on DAPT for 3 weeks and then told to follow with plavix alone. The patient was also started on atorvastatin. The patient was recommended CIR by physical therapy.   The patient continued to have mild encephalopathy noted on day of discharge. He thought that he was at home and kept looking for his dog. The patient was redirectable,orientable, and was not combative or aggressive. The patient's encephalopathy is likely hospital acquired delirium on a likely developing dementia. The patient should be followed by a primary care physician regarding these mental status changes.   Mild Rhabdomyolysis The patient has been having gait abnormalities for the past few weeks and fell a few times the day of admission. He presented post a fall with downtime of approximately 15 minutes. His ck on admission was 3646 with a creatinine of 1.66 and unknown baseline. He was given IV fluids and the patient's CK and creatinine improved.   Discharge Vitals:   BP (!) 120/91 (BP Location: Left Arm)   Pulse (!) 116   Temp 98.1 F (36.7 C) (Oral)   Resp 18   Ht 5\' 9"  (1.753 m)   Wt 177 lb 0.5 oz (80.3 kg)   SpO2 100%   BMI 26.14 kg/m   Pertinent Labs, Studies, and Procedures:    TEE (Jan 01, 2018): Lvef 45-50%, no vegetation in aortic valve, no thrombus in left atrium/ left atrial appendage, right atrium, no patent foramen ovale. A moderate non-mobile atheroma visualized in descending aorta.   Bilateral vascular lower extremity ultrasound: Negative for dvt or baker's cysts  CT head (12/04/17): Negative for any acute infarct.    MRI brain (12/04/17): Multiple small acute and subacute supratentorial infarcts and old left pica and right pca infarct along with old bilateral basal ganglia and thalamus infarcts.   CTA head and neck (12/05/17): No significant large vessel occlusion but some distal small vessel atheromatous irregularity.   Chest xray (12/04/17): No edema or consolidation  CBC Latest Ref Rng & Units 12/06/2017 12/03/2017  WBC 4.0 - 10.5 K/uL 5.5 7.5  Hemoglobin 13.0 - 17.0 g/dL 11.5(L) 12.0(L)  Hematocrit 39.0 - 52.0 % 35.4(L) 36.7(L)  Platelets 150 - 400 K/uL 247 277   Lipid Panel     Component Value Date/Time   CHOL 137 12/05/2017 0405   TRIG 139 12/05/2017 0405   HDL 41 12/05/2017 0405   CHOLHDL 3.3 12/05/2017 0405   VLDL 28 12/05/2017 0405   LDLCALC 68 12/05/2017 0405   Discharge Instructions: Discharge Instructions    Ambulatory referral to Neurology   Complete by:  As directed    Follow up with stroke clinic NP (Jessica Vanschaick or Cecille Rubin, if both not available, consider Dr. Mamie Nick  Sethi, Dr. Bess Harvest, or Dr. Sarina Ill) at [facility] in about 4 weeks.   Call MD for:  extreme fatigue   Complete by:  As directed    Call MD for:  persistant dizziness or light-headedness   Complete by:  As directed    Call MD for:  persistant nausea and vomiting   Complete by:  As directed    Diet - low sodium heart healthy   Complete by:  As directed    Increase activity slowly   Complete by:  As directed       Signed: Lars Mage, MD 12/09/2017, 2:29 PM   Pager: [phone]

## 2017-12-07 NOTE — CV Procedure (Signed)
     Transesophageal Echocardiogram Note  Adalid Beckmann [id number] 01-25-1956  Procedure: Transesophageal Echocardiogram Indications: cryptogenic stroke  Procedure Details Consent: Obtained Time Out: Verified patient identification, verified procedure, site/side was marked, verified correct patient position, special equipment/implants available, Radiology Safety Procedures followed,  medications/allergies/relevent history reviewed, required imaging and test results available.  Performed  Medications: During this procedure the patient is administered a total of Versed 5 mg and Fentanyl 25 mcg to achieve and maintain moderate conscious sedation.  The patient's heart rate, blood pressure, and oxygen saturation are monitored continuously during the procedure. The period of conscious sedation is 30 minutes, of which I was present face-to-face 100% of this time.  LVEF 45-50% diffuse hypokinesis No source of embolism, clean left atrial appendage,  Negative bubble study, no PFO Moderate non-mobile atheroma in the descending aorta.   Complications: No apparent complications Patient did tolerate procedure well.  Ena Dawley, MD, [facility] 12/07/2017, 10:11 AM

## 2017-12-07 NOTE — Progress Notes (Addendum)
STROKE TEAM PROGRESS NOTE   SUBJECTIVE (INTERVAL HISTORY) His brother and mother are at the bedside.  Pt is lying in bed, sitter at bedside. Had TEE this am and loop placed. Not currently confused and trying to get out of bed. Now clam and cooperative. Brother concerned about cognitive decline and wants sitter. Discussed options. SW will have most up to date referral options. [facility] (concerned about getting his mother help in caring for brother, she does well caring for herself but hard on her caring for him as he is more confused and wonders off at times.)   OBJECTIVE Temp:  [97.9 F (36.6 C)-98.8 F (37.1 C)] 98.8 F (37.1 C) (05/20 0943) Pulse Rate:  [71-90] 80 (05/20 1115) Cardiac Rhythm: Normal sinus rhythm (05/20 0700) Resp:  [10-24] 13 (05/20 1115) BP: (137-189)/(94-122) 137/95 (05/20 1115) SpO2:  [97 %-100 %] 98 % (05/20 1115) Weight:  [82.6 kg (182 lb)-82.6 kg (182 lb 1.6 oz)] 82.6 kg (182 lb) (05/20 0943)  CBC:  Recent Labs  Lab 12/03/17 2244 12/06/17 0552  WBC 7.5 5.5  HGB 12.0* 11.5*  HCT 36.7* 35.4*  MCV 88.0 87.8  PLT 277 562    Basic Metabolic Panel:  Recent Labs  Lab 12/06/17 0552 12/07/17 0720  NA 140 140  K 3.8 3.8  CL 106 104  CO2 27 28  GLUCOSE 96 101*  BUN 12 15  CREATININE 1.30* 1.46*  CALCIUM 9.4 9.2    Lipid Panel:     Component Value Date/Time   CHOL 137 12/05/2017 0405   TRIG 139 12/05/2017 0405   HDL 41 12/05/2017 0405   CHOLHDL 3.3 12/05/2017 0405   VLDL 28 12/05/2017 0405   LDLCALC 68 12/05/2017 0405   HgbA1c:  Lab Results  Component Value Date   HGBA1C 5.7 (H) 12/05/2017   Urine Drug Screen:     Component Value Date/Time   LABOPIA NONE DETECTED 12/04/2017 1102   COCAINSCRNUR NONE DETECTED 12/04/2017 1102   LABBENZ NONE DETECTED 12/04/2017 1102   AMPHETMU NONE DETECTED 12/04/2017 1102   THCU NONE DETECTED 12/04/2017 1102   LABBARB NONE DETECTED 12/04/2017 1102    Alcohol Level     Component  Value Date/Time   ETH <10 12/04/2017 0854    IMAGING Ct Head Wo Contrast 12/04/2017 Mild atrophy with multifocal prior infarcts and extensive supratentorial small vessel disease. No acute infarct evident. No mass or hemorrhage. Foci of arterial vascular calcification noted. There is mucosal thickening in several ethmoid air cells. There is probable cerumen in the right external auditory canal.   Mr Brain Wo Contrast 12/04/2017 1. Motion degraded examination. Multiple small acute and subacute supratentorial infarcts.  2. Faint bifrontal flared hyperintense suggesting motion artifact, less likely atypical hypertensive encephalopathy or metabolic disturbance.  3. Old LEFT PICA and RIGHT PCA territory infarcts. Old bilateral basal ganglia and thalamus infarcts.  4. Moderate to severe chronic small vessel ischemic changes.  5. Moderate parenchymal brain volume loss.   CTA NECK 12/05/2017 1. Negative CTA of neck with no hemodynamically significant stenosis identified. Mild atherosclerotic change for age.  2. Extremely poor dentition with innumerable dental caries and periapical lucencies.  3. Emphysema.   CTA HEAD 12/05/2017 1. Negative CTA for large vessel occlusion.  2. Moderate atheromatous bilateral V4 stenoses.  3. No other hemodynamically significant or correctable stenosis identified.  4. Distal small vessel atheromatous irregularity.   Dg Chest 2 View 12/04/2017 No edema or consolidation.   Transthoracic Echocardiogram -  Left ventricle: The cavity size was normal. Wall thickness was increased in a pattern of moderate LVH. Systolic function was mildly reduced. The estimated ejection fraction was in the range of 45% to 50%. Diffuse hypokinesis. There is severe hypokinesis of the basalinferior myocardium. Doppler parameters are consistent with abnormal left ventricular relaxation (grade 1 diastolic dysfunction). - Mitral valve: Calcified annulus. Mildly thickened leaflets  . Impressions: - Mild global hypokinesis with severe hypokinesis of the basal inferior wall; overall mild LV systolic dysfunction; moderate LVH; mild diastolic dysfunction.  Bilateral Lower Extremity Duplex  12/05/2017 Negative for DVT  TEE 12/07/2017 LVEF 45-50% diffuse hypokinesis No source of embolism, clean left atrial appendage,  Negative bubble study, no PFO Moderate non-mobile atheroma in the descending aorta.   PHYSICAL EXAM General - well nourished, well developed AA male  Heart - Regular rate and rhythm - no murmer appreciated Lungs - Clear to auscultation anteriorly Extremities - Distal pulses intact - no edema Skin - Warm and dry  Mental Status: Awake, alert, oriented to place and people, but not to recent holiday or upcoming holiday, thought content appropriate.  Speech fluent without evidence of aphasia.  Able to follow 3 step commands without difficulty. Cranial Nerves: II: Discs not visualized; Visual fields grossly normal, pupils equal, round, reactive to light. III,IV, VI: ptosis not present, extra-ocular motions intact bilaterally V,VII: smile symmetric, facial light touch sensation normal bilaterally VIII: hearing normal bilaterally IX,X: gag reflex present XI: bilateral shoulder shrug intact. XII: midline tongue extension Motor: RUE - 5/5    LUE - 5/5 RLE - 5/5    LLE -  5/5 Tone and bulk:normal tone throughout; no atrophy noted Sensory: Light touch intact throughout, bilaterally Deep Tendon Reflexes: 2+ and symmetric throughout Plantars: Right: downgoing   Left: downgoing Cerebellar: normal finger-to-nose, normal rapid alternating movements and normal heel-to-shin test Gait: not tested   ASSESSMENT/PLAN Mr. Ara Mano is a 62 y.o. male with history of previous strokes, hypertension, and hyperlipidemia, presenting with unsteady gait and confusion. He did not receive IV t-PA due to late presentation.  Strokes: left PCA and left MCA/ACA  scattered infarcts, etiology unclear  Resultant not fully orientated  CT head - no acute infarct but old left cerebellum, right caudate/BG/IC, left CR, and right MCA/PCA infarcts  MRI head - Multiple small acute and subacute supratentorial infarcts.  Multiple remote infarcts.  CTA H&N - negative  2D Echo - EF 45-50% but severe hypokinessis of the basalinferior myocardium  TEE EF 45-50%, no PFO, neg bubble, non mobile descending aortic plaque  loop placed post TEE  Bilateral lower extremity duplex - negative for DVT  LDL 68  HgbA1c 5.7  HIV neg  VTE prophylaxis - Lovenox  No antithrombotic prior to admission, now on aspirin 81 mg daily and clopidogrel 75 mg daily. Recommend DAPT for 3 weeks and then plavix alone.   Patient counseled to be compliant with his antithrombotic medications  Ongoing aggressive stroke risk factor management  Therapy recommendations: CIR  Disposition:  Pending NOTHING FURTHER TO ADD FROM THE STROKE STANDPOINT Patient has a 10-15% risk of having another stroke over the next year, the highest risk is within 2 weeks of the most recent stroke  Ongoing risk factor control by Primary Care Physician Stroke Service will sign off. Please call should any needs arise. Follow-up Stroke Clinic at [facility] in 4 weeks, order placed.  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 Defer to attending team.  Hypertension  BP stable . Permissive hypertension (OK if < 220/120) but gradually normalize in 5-7 days . Long-term BP goal normotensive  Hyperlipidemia  Lipid lowering medication PTA:  none   LDL 68, goal < 70  Currently on lipitor 20  Continue statin at discharge  Tobacco abuse  Current smoker  Smoking cessation counseling provided  Nicotine patch provided  Pt is willing to quit  Other Stroke  Risk Factors  Advanced age  Obesity  Hx stroke/TIA on imaging  Other Active Problems  Prednisone therapy (?arthritis)  Poor dentition  Mild rhabdo (found down in yard). Elevated creatinine 1.66->1.64->1.3->1.46  Progressive cognitive decline since previous stroke . TSH and Vit B12 WNL. Recommend SW consult at end of rehab for resources. [facility].   Vitamin B 12 deficiency, on home supplements  Hospital day # 2  Burnetta Sabin, MSN, APRN, ANVP-BC, AGPCNP-BC Advanced Practice Stroke Nurse [place] for Schedule & Pager information 12/07/2017 1:39 PM   ATTENDING NOTE: I reviewed above note and agree with the assessment and plan. I have made any additions or clarifications directly to the above note.   Pt had TEE today and no cardioemoblic source identified so far and loop recorder placed to rule out afib. Stroke work up complete. Continue DAPT for 3 weeks and then plavix alone. Continue statin. RPR 1:1 consistent with treated syphilis. Do not think LP needed at this time.   Neurology will sign off. Please call with questions. Pt will follow up with stroke clinic NP at [facility] in about 4 weeks. Thanks for the consult.  Rosalin Hawking, MD PhD Stroke Neurology 12/07/2017 3:59 PM    To contact Stroke Continuity provider, please refer to [email or web address] After hours, contact General Neurology

## 2017-12-07 NOTE — Progress Notes (Signed)
BP 189/120 on arrival to endoscopy for TEE. Verbal order received from Dr. Meda Coffee to give 10mg  Hydralazine IV. D/C prior 2mg  Hydralazine order from prior to arrival to endoscopy as this had not been given.

## 2017-12-07 NOTE — Plan of Care (Signed)
  Problem: Clinical Measurements: Goal: Ability to maintain clinical measurements within normal limits will improve Outcome: Progressing   

## 2017-12-07 NOTE — Progress Notes (Signed)
   Subjective: Karl Miller was seen laying in his bed with his brother and his mother at bedside. He was extremely lethargic, but responsive to questions. The patient's family mentined that Karl Miller has been having issues with his gait and weakness chronically and they would like him to have therapy and maybe be placed in a facility at least temporarily as the family has not been able to keep up with the patient's needs.   Objective:  Vital signs in last 24 hours: Vitals:   12/06/17 1638 12/06/17 2257 12/07/17 0415 12/07/17 0600  BP: (!) 146/106 (!) 158/106 (!) 167/110 (!) 158/110  Pulse: 88 76 76   Resp: 17 18 20    Temp: 98.1 F (36.7 C) 98.1 F (36.7 C) 97.9 F (36.6 C)   TempSrc: Oral Oral Oral   SpO2: 98% 100% 100%   Weight:   182 lb 1.6 oz (82.6 kg)    Physical Exam  Constitutional: He is oriented to person, place, and time. He appears well-developed and well-nourished. He appears lethargic. He is cooperative.  HENT:  Head: Normocephalic and atraumatic.  Eyes: Conjunctivae are normal.  Cardiovascular: Normal rate, regular rhythm and normal heart sounds.  No murmur heard. Respiratory: Effort normal and breath sounds normal. No respiratory distress. He has no wheezes.  GI: Soft. Bowel sounds are normal. He exhibits no distension. There is no tenderness.  Musculoskeletal:  4/5 muscle strength in bilateral lower extremities, 5/5 muscle strength in bilateral upper extremities. Sensation intact in bilateral face, upper extremity, and lower extremity  Neurological: He is oriented to person, place, and time. He appears lethargic. No cranial nerve deficit.  Skin: No erythema.  Psychiatric: He has a normal mood and affect. His behavior is normal. Judgment and thought content normal.   Assessment/Plan:  Karl Miller is a 62 y.o m with hx of prior stroke, htn, elevated psa awaiting biopsy who presented with acute encephalopathy and gait disturbance. CT head didn't show any acute  infarct. MRI found multiple small acute and subacute supratentorial infarcts and old left pica and right pca infarct along with old bilateral basal ganglia and thalamus infarcts. CTA head and neck did not show any significant large vessel occlusion but some distal small vessel atheromatous irregularity. TEE did not find any source of embolic source, but there was a moderate sized atheroma visualized in descending aorta. Patient got loop recorder placed to monitor for atrial fibrillation.   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   -TEE (12/07/17) showed lvef 45-50%, no vegetation in aortic valve, no thrombus in left atrium/ left atrial appendage, right atrium, no patent foramen ovale. A moderate non-mobile atheroma visualized in descending aorta.  -Patient will get loop recorder placed today 12/07/17 to monitor for atrial fibrillation. No afib visualized on telemetry thusfar. -Appreciate neurology following -Atorvastatin 20mg  qd -aspirin 81mg  and plavix 75mg   Mild rhabdomyolysis  Suspected Acute kidney injury  The patient's CK on admission was 4174 and plateaued to 3300 on 12/05/17 so stopped monitoring. Patient was given normal saline. Creatinine today 5/20 1.46 from prior 1.3 yesterday.   -Monitor bmp daily  Hx of BPH Elevated PSA Patient is scheduled to get a prostate biopsy done outpatient.   -Finasteride 5mg  qd  Dispo: Anticipated discharge in approximately 1-2 day(s).   Lars Mage, MD 12/07/2017, 6:24 AM Pager: [phone]

## 2017-12-07 NOTE — Consult Note (Signed)
Physical Medicine and Rehabilitation Consult Reason for Consult: Decreased functional mobility Referring Physician: [facility]   HPI: Karl Miller is a 62 y.o. right-handed male with history of hypertension, hyperlipidemia, tobacco abuse and TIA.  Per chart review, patient, and brother, patient lives with his mother, can only provide supervision at discharge.  One level home with level entry.  He uses a crutch occasionally.  Enjoys taking his dog for long walks.  Presented 12/04/2017 with unsteady gait and altered mental status.  Cranial CT reviewed, unremarkable for acute intracranial process. Per report, mild atrophy multifocal prior infarcts and extensive supratentorial small vessel disease.  No acute infarction evident.  MRI of the brain showed multiple small acute and subacute supratentorial infarctions.  Old left PICA and right PCA territory infarctions.  Old bilateral basal ganglia and thalamus infarctions.  CT angios head and neck negative for large vessel occlusion.  Echocardiogram with ejection fraction of 32% grade 1 diastolic dysfunction.  Review of Systems  Constitutional: Negative for chills and fever.  HENT: Negative for hearing loss.   Eyes: Negative for blurred vision and double vision.  Respiratory: Negative for shortness of breath.   Cardiovascular: Negative for chest pain and palpitations.  Gastrointestinal: Positive for constipation. Negative for nausea and vomiting.  Genitourinary: Negative for dysuria, flank pain and hematuria.  Musculoskeletal: Positive for myalgias.  Skin: Negative for rash.  Neurological: Positive for focal weakness.  Psychiatric/Behavioral: The patient has insomnia.   All other systems reviewed and are negative.  Past Medical History:  Diagnosis Date  . Acute encephalopathy 12/03/2017   [name] 12/04/2017  . High cholesterol   . Hypertension   . Stroke ([place])   . TIA (transient ischemic attack) 12/03/2017   [name] 12/04/2017   Past  Surgical History:  Procedure Laterality Date  . NO PAST SURGERIES     No pertinent family history of premature CVA. Social History:  reports that he has been smoking cigarettes.  He has a 12.00 pack-year smoking history. He has never used smokeless tobacco. He reports that he does not drink alcohol or use drugs. Allergies: No Known Allergies Medications Prior to Admission  Medication Sig Dispense Refill  . ferrous sulfate 325 (65 FE) MG tablet Take 325 mg by mouth See admin instructions. Take 1 tablet on Monday, Wednesday, And Friday    . finasteride (PROSCAR) 5 MG tablet Take 5 mg by mouth daily.    Marland Kitchen lisinopril-hydrochlorothiazide (PRINZIDE,ZESTORETIC) 20-12.5 MG tablet Take 1 tablet by mouth daily.    . methocarbamol (ROBAXIN) 750 MG tablet Take 750 mg by mouth every 8 (eight) hours as needed for muscle spasms.    . predniSONE (DELTASONE) 20 MG tablet Take 40 mg by mouth daily as needed.    . senna (SENOKOT) 8.6 MG TABS tablet Take 2 tablets by mouth daily.    . vitamin B-12 (CYANOCOBALAMIN) 500 MCG tablet Take 1,000 mcg by mouth daily.    . meloxicam (MOBIC) 7.5 MG tablet Take 1 tablet (7.5 mg total) by mouth daily. (Patient not taking: Reported on 12/04/2017) 10 tablet 0    Home: Home Living Family/patient expects to be discharged to:: Private residence Living Arrangements: Parent Available Help at Discharge: Family Type of Home: House Home Access: Level entry Home Layout: One level [place]: None Additional Comments: Patient states he has a "crutch," he uses but unsure if he means a cane or an actual crutch.  Lives With: Family(mother)  Functional History: Prior Function Level of Independence: Independent with  assistive device(s) Comments: uses a "crutch," occasionally. Enjoys taking his dog for walks. Functional Status:  Mobility: Bed Mobility Overal bed mobility: Needs Assistance Bed Mobility: Supine to Sit Supine to sit: Supervision General bed mobility comments:  up in chair Transfers Overall transfer level: Needs assistance Equipment used: Rolling walker (2 wheeled) Transfers: Sit to/from Stand, [employer] Transfers Sit to Stand: Min guard, Min assist Stand pivot transfers: Min guard, Min assist General transfer comment: Min A on initial stand from recliner. Steadier with rw. Pt completed multiple sit<>stands throughout session. Cues for technique with rw Ambulation/Gait Ambulation/Gait assistance: Min assist Ambulation Distance (Feet): 50 Feet Assistive device: 1 person hand held assist Gait Pattern/deviations: Step-through pattern, Scissoring, Decreased stride length General Gait Details: Patient with occasional scissoring and imbalance requiring up to minimal assistance. Decreased ability to dual task during gait.  Gait velocity: decreased    ADL: ADL Overall ADL's : Needs assistance/impaired Eating/Feeding: Set up, Sitting Grooming: Min guard, Standing, Minimal assistance, Applying deodorant, Brushing hair Upper Body Bathing: Set up, Sitting Lower Body Bathing: Sit to/from stand, Moderate assistance Upper Body Dressing : Sitting, Moderate assistance Lower Body Dressing: Moderate assistance, Sit to/from stand Toilet Transfer: Min guard, Ambulation, RW Toileting- Clothing Manipulation and Hygiene: Moderate assistance Toileting - Clothing Manipulation Details (indicate cue type and reason): decreased awareness/concern with urine spillage while attempting to stand to urinate into toilet. Tub/ Shower Transfer: Minimal assistance Functional mobility during ADLs: Min guard, Minimal assistance General ADL Comments: Pt completed functional mobility in the room, stood to urinate in bathroom, grooming and LB/ADL cleaning. Assist for balance strength and cognitive deficits.  Cognition: Cognition Overall Cognitive Status: Impaired/Different from baseline Arousal/Alertness: Awake/alert Orientation Level: Oriented to person, Oriented to situation,  Disoriented to place, Disoriented to time Attention: Sustained Sustained Attention: Impaired Sustained Attention Impairment: Verbal basic Memory: Impaired Memory Impairment: Storage deficit, Retrieval deficit, Decreased recall of new information Awareness: Impaired Awareness Impairment: Intellectual impairment Problem Solving: Impaired Problem Solving Impairment: Verbal basic Behaviors: Impulsive Safety/Judgment: Impaired Cognition Arousal/Alertness: Awake/alert Behavior During Therapy: WFL for tasks assessed/performed Overall Cognitive Status: Impaired/Different from baseline Area of Impairment: Orientation, Problem solving, Memory, Safety/judgement Orientation Level: Disoriented to, Time, Place, Situation Memory: Decreased recall of precautions, Decreased short-term memory Safety/Judgement: Decreased awareness of safety, Decreased awareness of deficits Problem Solving: Slow processing General Comments: difficulty following multiple step commands.  Blood pressure (!) 167/110, pulse 76, temperature 97.9 F (36.6 C), temperature source Oral, resp. rate 20, weight 82.6 kg (182 lb 1.6 oz), SpO2 100 %. Physical Exam  Vitals reviewed. Constitutional: He is oriented to person, place, and time. He appears well-developed and well-nourished.  HENT:  Head: Normocephalic and atraumatic.  Eyes: EOM are normal. Right eye exhibits no discharge. Left eye exhibits no discharge.  Neck: Normal range of motion. Neck supple. No thyromegaly present.  Cardiovascular: Normal rate, regular rhythm and normal heart sounds.  Respiratory: Effort normal and breath sounds normal. No respiratory distress.  GI: Soft. Bowel sounds are normal. He exhibits no distension.  Musculoskeletal:  No edema or tenderness in extremities  Neurological: He is alert and oriented to person, place, and time.  Limited medical historian.   Follows simple commands Motor: Right upper extremity: 4/5 proximal to distal with mild  dysmetria Left upper extremity: 5/5 proximal to distal Right lower extremity: 4+/5 proximal to distal Left lower extremity: 5/5 proximal to distal  Skin: Skin is warm and dry.  Psychiatric: He has a normal mood and affect. His behavior is normal.  Results for orders placed or performed during the hospital encounter of 12/03/17 (from the past 24 hour(s))  Basic metabolic panel     Status: Abnormal   Collection Time: 12/06/17  5:52 AM  Result Value Ref Range   Sodium 140 135 - 145 mmol/L   Potassium 3.8 3.5 - 5.1 mmol/L   Chloride 106 101 - 111 mmol/L   CO2 27 22 - 32 mmol/L   Glucose, Bld 96 65 - 99 mg/dL   BUN 12 6 - 20 mg/dL   Creatinine, Ser 1.30 (H) 0.61 - 1.24 mg/dL   Calcium 9.4 8.9 - 10.3 mg/dL   GFR calc non Af Amer 56 (L) >60 mL/min   GFR calc Af Amer >60 >60 mL/min   Anion gap 7 5 - 15  CBC     Status: Abnormal   Collection Time: 12/06/17  5:52 AM  Result Value Ref Range   WBC 5.5 4.0 - 10.5 K/uL   RBC 4.03 (L) 4.22 - 5.81 MIL/uL   Hemoglobin 11.5 (L) 13.0 - 17.0 g/dL   HCT 35.4 (L) 39.0 - 52.0 %   MCV 87.8 78.0 - 100.0 fL   MCH 28.5 26.0 - 34.0 pg   MCHC 32.5 30.0 - 36.0 g/dL   RDW 14.4 11.5 - 15.5 %   Platelets 247 150 - 400 K/uL   No results found.  Assessment/Plan: Diagnosis: multiple small acute and subacute supratentorial infarctions. Labs and images independently reviewed.  Records reviewed and summated above. Stroke: Continue secondary stroke prophylaxis and Risk Factor Modification listed below:   Antiplatelet therapy:   Blood Pressure Management:  Continue current medication with prn's with permisive HTN per primary team Statin Agent:   Prediabetes management:   Tobacco abuse:   Right sided hemiparesis:  Motor recovery: Fluoxetine  1. Does the need for close, 24 hr/day medical supervision in concert with the patient's rehab needs make it unreasonable for this patient to be served in a less intensive setting? Potentially  2. Co-Morbidities  requiring supervision/potential complications: HTN (monitor and provide prns in accordance with increased physical exertion and pain), hyperlipidemia (cont meds), tobacco abuse (counsel), TIA (cont meds), diastolic dysfunction (monitor for signs and symptoms of fluid overload), prediabetes (Monitor in accordance with exercise and adjust meds as necessary), ABLA (transfuse if necessary to ensure appropriate perfusion for increased activity tolerance), CKD (avoid nephrotoxic meds) 3. Due to safety, disease management and patient education, does the patient require 24 hr/day rehab nursing? Potentially 4. Does the patient require coordinated care of a physician, rehab nurse, PT (1-2 hrs/day, 5 days/week) and OT (1-2 hrs/day, 5 days/week) to address physical and functional deficits in the context of the above medical diagnosis(es)? Potentially Addressing deficits in the following areas: balance, endurance, locomotion, strength, transferring, bathing, dressing, toileting and psychosocial support 5. Can the patient actively participate in an intensive therapy program of at least 3 hrs of therapy per day at least 5 days per week? Yes 6. The potential for patient to make measurable gains while on inpatient rehab is excellent 7. Anticipated functional outcomes upon discharge from inpatient rehab are modified independent and supervision  with PT, modified independent and supervision with OT, n/a with SLP. 8. Estimated rehab length of stay to reach the above functional goals is: 5-9 days. 9. Anticipated D/C setting: Home 10. Anticipated post D/C treatments: HH therapy and Home excercise program 11. Overall Rehab/Functional Prognosis: good  RECOMMENDATIONS: This patient's condition is appropriate for continued rehabilitative care in the following setting: Recommend  reeval by therapies, if pt continues to have signficant functional deficits recommend CIR, otherwise home with HH.Marland Kitchen Patient has agreed to participate in  recommended program. Potentially Note that insurance prior authorization may be required for reimbursement for recommended care.  Comment: Rehab Admissions Coordinator to follow up.   I have personally performed a face to face diagnostic evaluation, including, but not limited to relevant history and physical exam findings, of this patient and developed relevant assessment and plan.  Additionally, I have reviewed and concur with the physician assistant's documentation above.   Delice Lesch, MD, ABPMR Lavon Paganini Angiulli, PA-C 12/07/2017

## 2017-12-08 DIAGNOSIS — I6350 Cerebral infarction due to unspecified occlusion or stenosis of unspecified cerebral artery: Secondary | ICD-10-CM

## 2017-12-08 LAB — BASIC METABOLIC PANEL
Anion gap: 8 (ref 5–15)
BUN: 18 mg/dL (ref 6–20)
CHLORIDE: 107 mmol/L (ref 101–111)
CO2: 26 mmol/L (ref 22–32)
Calcium: 9.6 mg/dL (ref 8.9–10.3)
Creatinine, Ser: 1.57 mg/dL — ABNORMAL HIGH (ref 0.61–1.24)
GFR calc Af Amer: 52 mL/min — ABNORMAL LOW (ref >60)
GFR calc non Af Amer: 45 mL/min — ABNORMAL LOW (ref >60)
Glucose, Bld: 76 mg/dL (ref 65–99)
POTASSIUM: 4 mmol/L (ref 3.5–5.1)
Sodium: 141 mmol/L (ref 135–145)

## 2017-12-08 LAB — GLUCOSE, CAPILLARY
GLUCOSE-CAPILLARY: 136 mg/dL — AB (ref 65–99)
GLUCOSE-CAPILLARY: 87 mg/dL (ref 65–99)
Glucose-Capillary: 64 mg/dL — ABNORMAL LOW (ref 65–99)
Glucose-Capillary: 66 mg/dL (ref 65–99)

## 2017-12-08 MED ORDER — LISINOPRIL-HYDROCHLOROTHIAZIDE 20-12.5 MG PO TABS: 1.0000 | ORAL_TABLET | Freq: Every day | ORAL | Status: DC

## 2017-12-08 MED ORDER — HYDROCHLOROTHIAZIDE 12.5 MG PO CAPS
12.5000 mg | ORAL_CAPSULE | Freq: Every day | ORAL | Status: DC
  Administered 2017-12-08 – 2017-12-09 (×2): 12.5 mg via ORAL
  Filled 2017-12-08 (×2): qty 1

## 2017-12-08 MED ORDER — LISINOPRIL 20 MG PO TABS
20.0000 mg | ORAL_TABLET | Freq: Every day | ORAL | Status: DC
  Administered 2017-12-08 – 2017-12-09 (×2): 20 mg via ORAL
  Filled 2017-12-08: qty 1
  Filled 2017-12-08: qty 2

## 2017-12-08 NOTE — Progress Notes (Signed)
Rehab admissions - I had 2 rehab physicians review patient's record and progress.  Patient is doing well functionally and will not need an inpatient rehab stay.  Recommendations are for home with [facility] services.  Call me for questions.  #[phone]

## 2017-12-08 NOTE — Progress Notes (Addendum)
   Subjective: Mr. Orcutt appeared more alert and responsive this morning. He stated that he was able to walk around in the hallway with nurse tech yesterday without any difficulty.   Objective:  Vital signs in last 24 hours: Vitals:   12/07/17 1838 12/07/17 1950 12/08/17 0004 12/08/17 0621  BP: (!) 159/112 (!) 172/108 (!) 179/116 (!) 153/105  Pulse: 82 (!) 113 (!) 103 79  Resp: '18 18 18 18  '$ Temp: 98.4 F (36.9 C) 98.1 F (36.7 C) (!) 97.5 F (36.4 C) 97.9 F (36.6 C)  TempSrc:  Oral Oral Oral  SpO2: 100% 100% 96% 98%  Weight:    176 lb 12.9 oz (80.2 kg)  Height:      Physical Exam  Constitutional: He appears well-developed and well-nourished. No distress.  HENT:  Head: Normocephalic and atraumatic.  Eyes: Conjunctivae are normal.  Cardiovascular: Normal rate, regular rhythm and normal heart sounds.  Respiratory: Effort normal and breath sounds normal. No respiratory distress. He has no wheezes.  GI: Bowel sounds are normal. He exhibits no distension. There is no tenderness.  Musculoskeletal: He exhibits no edema.  5/5 muscle strength in bilateral upper and lower extremities. Sensation intact  Neurological: He is alert.  Skin: He is not diaphoretic. Erythema: midchest has erythema at site of loop recorder placement.  Anterior chest has mild amount of bleeding at site of loop recorder  Psychiatric: He has a normal mood and affect. His behavior is normal. Judgment and thought content normal.   Assessment/Plan:  Mr. Rosero is a 62 y.o m with hx of prior stroke, htn, elevated psa awaiting biopsy who presented with acute encephalopathy and gait disturbance. CT head didn't show any acute infarct. MRI found multiple small acute and subacute supratentorial infarcts and old left pica and right pca infarct along with old bilateral basal ganglia and thalamus infarcts. CTA head and neck did not show any significant large vessel occlusion but some distal small vessel atheromatous  irregularity. TEE did not find any source of embolic source, but there was a moderate sized atheroma visualized in descending aorta. Patient got loop recorder placed to monitor for atrial fibrillation.   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   -TEE and loop recorder placement were completed yesterday 12/07/17 -PM&R evaluated patient for CIR and felt that the patient's needs would be bet met with home health PT. Physical therapy re-evaluation of the patient's needs were requested.   -Appreciate neurology following -Atorvastatin '20mg'$  qd -aspirin '81mg'$  and plavix '75mg'$   Mild rhabdomyolysis  Suspected Acute kidney injury  Patient's kidney function continues to improve  -Pending bmp  Essential Hypertension The patient's blood pressure has been ranging 137-189/95-120  -Restarted home lisinopril-hctz 20-12.'5mg'$  qd  Hx of BPH Elevated PSA Patient is scheduled to get a prostate biopsy done outpatient.   -Finasteride '5mg'$  qd  Dispo: Anticipated discharge in approximately 0-1 day. Physical therapy recommends CIR, request PM&R to please re-assess.   Lars Mage, MD 12/08/2017, 7:00 AM Pager: [phone]

## 2017-12-08 NOTE — Care Management Note (Signed)
Case Management Note  Patient Details  Name: Tomaz Janis MRN: [id number] Date of Birth: 11-Jul-1956  Subjective/Objective:                    Action/Plan: Pt denied for CIR. CM spoke to the patients brother and mother (over the phone). They would like some rehab for patient before he returns home. Will update CSW.   Expected Discharge Date:                  Expected Discharge Plan:  IP Rehab Facility  In-House Referral:  Clinical Social Work  Discharge planning Services  CM Consult  Post Acute Care Choice:    Choice offered to:     DME Arranged:    DME Agency:     HH Arranged:    [place] Agency:     Status of Service:  In process, will continue to follow  If discussed at Long Length of Stay Meetings, dates discussed:    Additional Comments:  Pollie Friar, RN 12/08/2017, 3:58 PM

## 2017-12-08 NOTE — Progress Notes (Signed)
Called to see secondary to bleeding from his loop recorder site. Apparently the dressing had been changed earlier and the PM RN noted it was soaked through. I removed the dressing, small incision ,currently not actively oozing. I replaced dressing with folded 4x4 and paper tape.  Kerin Ransom PA-C 12/08/2017 6:39 PM

## 2017-12-08 NOTE — Progress Notes (Signed)
  Speech Language Pathology Treatment: Cognitive-Linquistic  Patient Details Name: Karl Miller MRN: [id number] DOB: 05/08/1956 Today's Date: 12/08/2017 Time: 0037-0488 SLP Time Calculation (min) (ACUTE ONLY): 15 min  Assessment / Plan / Recommendation Clinical Impression  Skilled treatment session focused on cognition goals. SLP facilitated session by providing supervision cues to recall orientation information, demonstrate selective attention in mildly distracting environment and demonstrate intellectual awareness. Per chart review, pt's confusion appears to be resolved as further reiterated by ability in today's session. Pt required Max A cues to demonstrate anticipatory awareness.    HPI HPI: Pt. is a 62 y.o. M with significant PMH of HTN, elevated PSA, TIA's, tobacco use, who presents with acute encephalopathy and gait disturbance. MRI shows multifocal embolic strokes.      SLP Plan  Continue with current plan of care       Recommendations                   Follow up Recommendations: Inpatient Rehab SLP Visit Diagnosis: Cognitive communication deficit (Q91.694) Plan: Continue with current plan of care       [place] 12/08/2017, 3:11 PM

## 2017-12-08 NOTE — Progress Notes (Signed)
Physical Therapy Treatment Patient Details Name: Karl Miller MRN: [id number] DOB: 22-Mar-1956 Today's Date: 12/08/2017    History of Present Illness Pt. is a 62 y.o. M with significant PMH of HTN, elevated PSA, TIA's, tobacco use, who presents with acute encephalopathy and gait disturbance. MRI shows multifocal embolic strokes.    PT Comments    Patient pleasant and agreeable to participate in therapy this am. Pt is making progress toward mobility goals however does present with impaired cognition and gait deviations increasing risk for falls with gait velocity of  <1.8 ft/sec, indicate of risk for recurrent falls. Pt with tangential dialogue throughout session and very focused on taking his dog out. Continue to recommend post acute rehab for furthers killed PT services to maximize independence and safety with mobility.    Follow Up Recommendations  CIR     Equipment Recommendations  None recommended by PT    Recommendations for Other Services Rehab consult     Precautions / Restrictions Precautions Precautions: Fall Restrictions Weight Bearing Restrictions: No    Mobility  Bed Mobility Overal bed mobility: Needs Assistance Bed Mobility: Supine to Sit     Supine to sit: Supervision     General bed mobility comments: supervision for safety; pt is a little impulsive  Transfers Overall transfer level: Needs assistance Equipment used: Rolling walker (2 wheeled) Transfers: Sit to/from [employer] Transfers Sit to Stand: Min guard         General transfer comment: min guard for safety; pt requires at least single UE support upon standing for balance  Ambulation/Gait Ambulation/Gait assistance: Min assist Ambulation Distance (Feet): (hallway ambulation) Assistive device: Rolling walker (2 wheeled) Gait Pattern/deviations: Step-through pattern;Decreased stride length Gait velocity: decreased Gait velocity interpretation: <1.8 ft/sec, indicate of risk for  recurrent falls General Gait Details: pt with inability to change gait speed and with decreased cadence and stride length; one LOB when navigating around furniture in room   Stairs             Wheelchair Mobility    Modified Rankin (Stroke Patients Only) Modified Rankin (Stroke Patients Only) Pre-Morbid Rankin Score: Moderate disability Modified Rankin: Moderately severe disability     Balance Overall balance assessment: Needs assistance Sitting-balance support: Feet supported;No upper extremity supported Sitting balance-Leahy Scale: Good     Standing balance support: No upper extremity supported;During functional activity Standing balance-Leahy Scale: Fair                              Cognition Arousal/Alertness: Awake/alert Behavior During Therapy: WFL for tasks assessed/performed Overall Cognitive Status: Impaired/Different from baseline Area of Impairment: Orientation;Problem solving;Memory;Safety/judgement;Awareness                 Orientation Level: Disoriented to;Situation   Memory: Decreased recall of precautions;Decreased short-term memory   Safety/Judgement: Decreased awareness of safety;Decreased awareness of deficits   Problem Solving: Slow processing General Comments: pt with tangential dialogue throughout session mainly focused on his dog; pt reports he is in a hospital but "not a normal hospital" because people live here and believes his dog is here and he can't go let her out because "they have alarms on the doors"      Exercises      General Comments        Pertinent Vitals/Pain Pain Assessment: No/denies pain    Home Living  Prior Function            PT Goals (current goals can now be found in the care plan section) Acute Rehab PT Goals Patient Stated Goal: get back to walking his dog PT Goal Formulation: With patient Time For Goal Achievement: 12/19/17 Potential to Achieve Goals:  Good Progress towards PT goals: Progressing toward goals    Frequency    Min 4X/week      PT Plan Current plan remains appropriate    Co-evaluation              AM-PAC PT "6 Clicks" Daily Activity  Outcome Measure  Difficulty turning over in bed (including adjusting bedclothes, sheets and blankets)?: None Difficulty moving from lying on back to sitting on the side of the bed? : A Little Difficulty sitting down on and standing up from a chair with arms (e.g., wheelchair, bedside commode, etc,.)?: Unable Help needed moving to and from a bed to chair (including a wheelchair)?: A Little Help needed walking in hospital room?: A Little Help needed climbing 3-5 steps with a railing? : A Lot 6 Click Score: 16    End of Session Equipment Utilized During Treatment: Gait belt Activity Tolerance: Patient tolerated treatment well Patient left: in chair;with call bell/phone within reach;with chair alarm set;with nursing/sitter in room Nurse Communication: Mobility status PT Visit Diagnosis: Unsteadiness on feet (R26.81);Difficulty in walking, not elsewhere classified (R26.2)     Time: 3244-0102 PT Time Calculation (min) (ACUTE ONLY): 23 min  Charges:  $Gait Training: 23-37 mins                    G Codes:       Earney Navy, PTA Pager: [phone]     Darliss Cheney 12/08/2017, 9:28 AM

## 2017-12-08 NOTE — Progress Notes (Signed)
Occupational Therapy Treatment Patient Details Name: Karl Miller MRN: [id number] DOB: [birth year] Today's Date: 12/08/2017    History of present illness Pt. is a 62 y.o. M with significant PMH of HTN, elevated PSA, TIA's, tobacco use, who presents with acute encephalopathy and gait disturbance. MRI shows multifocal embolic strokes.   OT comments  Pt making progress towards goals. Upon entering the room, pt standing with brother present and states need to use bathroom. Pt ambulates without use of AW with steady assistance to bathroom for toileting. Pt able to perform clothing management and hygiene with steady assistance. Pt washing hands with increased time and mod cues for sequencing of task. Pt refusing additional intervention and returning to bed secondary to fatigue. Pt not oriented to situation or time this session. Bed alarm activated and family member present in room.   Follow Up Recommendations  CIR    Equipment Recommendations       Recommendations for Other Services Rehab consult    Precautions / Restrictions Precautions Precautions: Fall       Mobility  Transfers Overall transfer level: Needs assistance Equipment used: None Transfers: Sit to/from [employer] Transfers Sit to Stand: Min guard         Balance Overall balance assessment: Needs assistance Sitting-balance support: Feet supported;No upper extremity supported Sitting balance-Leahy Scale: Good     Standing balance support: No upper extremity supported;During functional activity Standing balance-Leahy Scale: Fair Standing balance comment: needs increased assistance for dynamic balance         ADL either performed or assessed with clinical judgement   ADL        Toilet Transfer: Min guard;Ambulation   Toileting- Clothing Manipulation and Hygiene: Min guard         General ADL Comments: steady assistance overall with pt ambulating to bathroom for toileting need and then washing  hands at sink with increased time and mod verbal cues for sequencing and initiation.                Cognition Arousal/Alertness: Awake/alert Behavior During Therapy: WFL for tasks assessed/performed Overall Cognitive Status: Impaired/Different from baseline Area of Impairment: Orientation;Problem solving;Memory;Safety/judgement;Awareness      Orientation Level: Disoriented to;Situation;Time   Memory: Decreased recall of precautions;Decreased short-term memory   Safety/Judgement: Decreased awareness of safety;Decreased awareness of deficits   Problem Solving: Slow processing                     Pertinent Vitals/ Pain       Pain Assessment: No/denies pain      Frequency  Min 2X/week        Progress Toward Goals  OT Goals(current goals can now be found in the care plan section)  Progress towards OT goals: Progressing toward goals  Acute Rehab OT Goals Patient Stated Goal: none stated  Plan Discharge plan remains appropriate       AM-PAC PT "6 Clicks" Daily Activity     Outcome Measure   Help from another person eating meals?: None Help from another person taking care of personal grooming?: A Little Help from another person toileting, which includes using toliet, bedpan, or urinal?: A Little Help from another person bathing (including washing, rinsing, drying)?: A Lot Help from another person to put on and taking off regular upper body clothing?: A Little Help from another person to put on and taking off regular lower body clothing?: A Lot 6 Click Score: 17    End of Session  OT Visit Diagnosis: Unsteadiness on feet (R26.81);Muscle weakness (generalized) (M62.81);Other symptoms and signs involving cognitive function   Activity Tolerance Patient tolerated treatment well   Patient Left in bed;with bed alarm set;with family/visitor present   Nurse Communication          Time: 2897-9150 OT Time Calculation (min): 10 min  Charges: OT  Treatments $Therapeutic Activity: 8-22 mins     Gypsy Decant 12/08/2017, 3:50 PM

## 2017-12-08 NOTE — Progress Notes (Signed)
Pt has a bleeding Loop recorder sight that was placed 5/20. MD notified, dressing was changed earlier today but it will not stop bleeding.

## 2017-12-09 MED ORDER — ASPIRIN 81 MG PO CHEW: 81.0000 mg | CHEWABLE_TABLET | Freq: Every day | ORAL | 0 refills | Status: AC

## 2017-12-09 MED ORDER — ATORVASTATIN CALCIUM 20 MG PO TABS: 20.0000 mg | ORAL_TABLET | Freq: Every day | ORAL | 0 refills | Status: DC

## 2017-12-09 MED ORDER — CLOPIDOGREL BISULFATE 75 MG PO TABS: 75.0000 mg | ORAL_TABLET | Freq: Every day | ORAL | 0 refills | Status: AC

## 2017-12-09 NOTE — Progress Notes (Signed)
Pt refusing telemetry box, attempted to educate, non-compliant (taking off box). Patient also non compliant with safety precautions. Educated family on need for chair alarm and bed alarm, verbalize understanding and able to teach back. Patient sometimes follows commands, not easily redirectable. Family is assisting with redirection.

## 2017-12-09 NOTE — Clinical Social Work Note (Signed)
Clinical Social Work Assessment  Patient Details  Name: Karl Miller MRN: [id number] Date of Birth: 13-Feb-1956  Date of referral:  12/09/17               Reason for consult:  Facility Placement                Permission sought to share information with:  Facility Sport and exercise psychologist, Family Supports Permission granted to share information::  Yes, Verbal Permission Granted  Name::     Migdalia Dk  Agency::  SNF  Relationship::  Mother, brother  Contact Information:     Housing/Transportation Living arrangements for the past 2 months:  Single Family Home Source of Information:  Patient, Siblings, Parent, Case Manager Patient Interpreter Needed:  None Criminal Activity/Legal Involvement Pertinent to Current Situation/Hospitalization:  No - Comment as needed Significant Relationships:  Siblings, Parents Lives with:  Self, Parents Do you feel safe going back to the place where you live?  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   Social Worker assessment / plan:  CSW received information about patient and needing short term rehab. CSW received permission to fax out referral and follow up with bed offers. CSW discussed patient case with Admissions at [facility], and received bed offer for patient. CSW to continue to follow.  Employment status:  Retired Forensic scientist:    PT Recommendations:  Inpatient [place] / Referral to community resources:  [place]  Patient/Family's Response to care:  Patient and family agreeable to SNF placement.  Patient/Family's Understanding of and Emotional Response to Diagnosis, Current Treatment, and Prognosis:  Patient's response unable to be assessed due to confusion. Patient's family indicated that  patient will need short term rehab prior to returning home, because he acts as a caregiver for the patient's mother. Patient's mother has limitations and will not be able to assist the patient until he gets more of his strength back. Patient's family hopeful that patient will find a bed to admit for SNF.  Emotional Assessment Appearance:  Appears stated age Attitude/Demeanor/Rapport:  Unable to Assess Affect (typically observed):  Unable to Assess Orientation:  Oriented to Self Alcohol / Substance use:  Not Applicable Psych involvement (Current and /or in the community):  No (Comment)  Discharge Needs  Concerns to be addressed:  Care Coordination Readmission within the last 30 days:  No Current discharge risk:  Physical Impairment, Cognitively Impaired, Dependent with Mobility Barriers to Discharge:  Continued Medical Work up, Inadequate or no insurance   [place], [place] 12/09/2017, 1:47 PM

## 2017-12-09 NOTE — Progress Notes (Signed)
Occupational Therapy Treatment Patient Details Name: Karl Miller MRN: [id number] DOB: 1956-01-03 Today's Date: 12/09/2017    History of present illness Pt. is a 62 y.o. M with significant PMH of HTN, elevated PSA, TIA's, tobacco use, who presents with acute encephalopathy and gait disturbance. MRI shows multifocal embolic strokes.   OT comments  Pt received getting out of recliner without assist upon OT arrival. Pt stated he needed to go to the bathroom and then go look for his brother and his shoes. Pt confused and required redirection to return to recliner with alarm set after toileting and hygiene at sink. OT will continue to follow acutely  Follow Up Recommendations  CIR    Equipment Recommendations       Recommendations for Other Services Rehab consult    Precautions / Restrictions Precautions Precautions: Fall Restrictions Weight Bearing Restrictions: No       Mobility Bed Mobility Overal bed mobility: Needs Assistance             General bed mobility comments: pt getting out of recliner without assist upon arrival  Transfers Overall transfer level: Needs assistance Equipment used: None Transfers: Sit to/from Stand;Stand Pivot Transfers Sit to Stand: Min guard Stand pivot transfers: Min guard       General transfer comment: min guard for safety; pt requires at least single UE support upon standing for balance    Balance Overall balance assessment: Needs assistance Sitting-balance support: Feet supported;No upper extremity supported Sitting balance-Leahy Scale: Good     Standing balance support: No upper extremity supported;During functional activity Standing balance-Leahy Scale: Fair                             ADL either performed or assessed with clinical judgement   ADL Overall ADL's : Needs assistance/impaired     Grooming: Min guard;Standing;Wash/dry hands;Wash/dry Sports administrator: Min  guard;Ambulation   Toileting- Clothing Manipulation and Hygiene: Min guard Toileting - Clothing Manipulation Details (indicate cue type and reason): decreased awareness/concern with urine spillage while attempting to stand to urinate into toilet. Tub/ Shower Transfer: Min guard;Ambulation   Functional mobility during ADLs: Min guard General ADL Comments: steady assistance overall with pt ambulating to bathroom for toileting need and then washing hands at sink with increased time and mod verbal cues for sequencing and initiation.      Vision Patient Visual Report: No change from baseline     Perception     Praxis      Cognition Arousal/Alertness: Awake/alert Behavior During Therapy: Impulsive Overall Cognitive Status: Impaired/Different from baseline Area of Impairment: Orientation;Problem solving;Memory;Safety/judgement;Awareness                 Orientation Level: Disoriented to;Situation;Time   Memory: Decreased recall of precautions;Decreased short-term memory   Safety/Judgement: Decreased awareness of safety;Decreased awareness of deficits   Problem Solving: Slow processing General Comments: pt with tangential dialogue throughout session mainly focused on his dog; pt reports he is in a hospital but "not a normal hospital" because people live here and believes his dog is here and he can't go let her out because "they have alarms on the doors"        Exercises     Shoulder Instructions       General Comments      Pertinent Vitals/ Pain  Pain Assessment: No/denies pain  Home Living                                          Prior Functioning/Environment              Frequency           Progress Toward Goals  OT Goals(current goals can now be found in the care plan section)  Progress towards OT goals: Progressing toward goals     Plan Discharge plan remains appropriate    Co-evaluation                 AM-PAC PT  "6 Clicks" Daily Activity     Outcome Measure   Help from another person eating meals?: None Help from another person taking care of personal grooming?: A Little Help from another person toileting, which includes using toliet, bedpan, or urinal?: A Little Help from another person bathing (including washing, rinsing, drying)?: A Lot Help from another person to put on and taking off regular upper body clothing?: A Little Help from another person to put on and taking off regular lower body clothing?: A Lot 6 Click Score: 17    End of Session Equipment Utilized During Treatment: Rolling walker  OT Visit Diagnosis: Unsteadiness on feet (R26.81);Muscle weakness (generalized) (M62.81);Other symptoms and signs involving cognitive function   Activity Tolerance Patient tolerated treatment well   Patient Left in chair;with chair alarm set   Nurse Communication      Functional Assessment Tool Used: AM-PAC 6 Clicks Daily Activity   Time: 0814-4818 OT Time Calculation (min): 14 min  Charges: OT G-codes **NOT FOR INPATIENT CLASS** Functional Assessment Tool Used: AM-PAC 6 Clicks Daily Activity OT General Charges $OT Visit: 1 Visit OT Treatments $Self Care/Home Management : 8-22 mins     Britt Bottom 12/09/2017, 2:29 PM

## 2017-12-09 NOTE — Progress Notes (Signed)
Patient took his dressing off, tele off, and was combative and confused, thinking he was in his home. Redirected patient, but he is a fall risk. Bed alarm on. Safety maintained.

## 2017-12-09 NOTE — Progress Notes (Signed)
  Speech Language Pathology Treatment: Cognitive-Linquistic  Patient Details Name: Karl Miller MRN: [id number] DOB: [birth year] Today's Date: 12/09/2017 Time: 7414-2395 SLP Time Calculation (min) (ACUTE ONLY): 15 min  Assessment / Plan / Recommendation Clinical Impression  Skilled treatment session focused on cognition goals. SLP received pt upright in recliner. Pt with confusion over situation and perseverates on brother coming to get him and on previous events in [employer]. SLP attempted to redirect and mold conversation but was not able to. As SLP left room, pt stood up and wanted to find his shoes. Pt required Max A to sit down (chair alarm on) and he appeared irritated. Emotional encouragement provided. Question if continued confusion is related to possible dementia.     HPI HPI: Pt. is a 62 y.o. M with significant PMH of HTN, elevated PSA, TIA's, tobacco use, who presents with acute encephalopathy and gait disturbance. MRI shows multifocal embolic strokes.      SLP Plan  Continue with current plan of care       Recommendations                   Follow up Recommendations: Skilled Nursing facility SLP Visit Diagnosis: Cognitive communication deficit (V20.233) Plan: Continue with current plan of care       Karl Miller 12/09/2017, 10:45 AM

## 2017-12-09 NOTE — Care Management Note (Signed)
Case Management Note  Patient Details  Name: Karl Miller MRN: [id number] Date of Birth: 1956-04-30  Subjective/Objective:                    Action/Plan: Pt discharging to [facility] today. CM signing off.    Expected Discharge Date:  12/09/17               Expected Discharge Plan:  IP Rehab Facility  In-House Referral:  Clinical Social Work  Discharge planning Services  CM Consult  Post Acute Care Choice:    Choice offered to:     DME Arranged:    DME Agency:     HH Arranged:    HH Agency:     Status of Service:  In process, will continue to follow  If discussed at Long Length of Stay Meetings, dates discussed:    Additional Comments:  Pollie Friar, RN 12/09/2017, 4:20 PM

## 2017-12-09 NOTE — Clinical Social Work Placement (Signed)
Nurse to call report to [phone], Room 214    CLINICAL SOCIAL WORK PLACEMENT  NOTE  Date:  12/09/2017  Patient Details  Name: Karl Miller MRN: [id number] Date of Birth: March 24, 1956  Clinical Social Work is seeking post-discharge placement for this patient at the [place] level of care (*CSW will initial, date and re-position this form in  chart as items are completed):  Yes   Patient/family provided with [facility] Work Department's list of facilities offering this level of care within the geographic area requested by the patient (or if unable, by the patient's family).  Yes   Patient/family informed of their freedom to choose among providers that offer the needed level of care, that participate in Medicare, Medicaid or managed care program needed by the patient, have an available bed and are willing to accept the patient.  Yes   Patient/family informed of [facility]'s ownership interest in [facility] and [facility], as well as of the fact that they are under no obligation to receive care at these facilities.  PASRR submitted to EDS on       PASRR number received on 12/07/17     Existing PASRR number confirmed on       FL2 transmitted to all facilities in geographic area requested by pt/family on 12/07/17     FL2 transmitted to all facilities within larger geographic area on       Patient informed that his/her managed care company has contracts with or will negotiate with certain facilities, including the following:        Yes   Patient/family informed of bed offers received.  Patient chooses bed at [facility]     Physician recommends and patient chooses bed at      Patient to be transferred to [facility] on 12/09/17.  Patient to be transferred to facility by Family car     Patient family notified on 12/09/17 of transfer.  Name of family member notified:  Brother     PHYSICIAN       Additional Comment:     _______________________________________________ [name], LCSW 12/09/2017, 3:26 PM

## 2017-12-09 NOTE — Progress Notes (Signed)
   Subjective:  Karl Miller was seen resting in chair by his bedside this morning. He denies any weakness or sensory deficits. He expressed being ready to be discharged to SNF.   Objective:  Vital signs in last 24 hours: Vitals:   12/08/17 1627 12/08/17 2025 12/09/17 0037 12/09/17 0430  BP: (!) 142/106 (!) 135/100 (!) 152/91 (!) 158/102  Pulse: 73 87 68 78  Resp: 16 20 20 20   Temp: 98.4 F (36.9 C) 98.2 F (36.8 C) 97.9 F (36.6 C) 98.1 F (36.7 C)  TempSrc: Oral Oral Oral Oral  SpO2: 99% 100% 100% 100%  Weight:    177 lb 0.5 oz (80.3 kg)  Height:       Physical Exam  Constitutional: He appears well-developed and well-nourished. No distress.  HENT:  Head: Normocephalic and atraumatic.  Eyes: Conjunctivae are normal.  Cardiovascular: Normal rate, regular rhythm and normal heart sounds.  Respiratory: Breath sounds normal. No respiratory distress. He has no wheezes.  GI: Soft. Bowel sounds are normal. He exhibits no distension. There is no tenderness.  Musculoskeletal: He exhibits no edema.  Neurological: He is alert. No cranial nerve deficit.  Skin: He is not diaphoretic. No erythema.  Psychiatric: He has a normal mood and affect. His behavior is normal. Judgment and thought content normal.   Assessment/Plan:  Karl Miller is a 62 y.o m with hx of prior stroke, htn, elevated psa awaiting biopsy who presented with acute encephalopathy and gait disturbance. CT head didn't show any acute infarct. MRI found multiple small acute and subacute supratentorial infarcts and old left pica and right pca infarct along with old bilateral basal ganglia and thalamus infarcts. CTA head and neck did not show any significant large vessel occlusion but some distal small vessel atheromatous irregularity.TEE did not find any source of embolic source, but there was a moderate sized atheroma visualized in descending aorta. Patient got loop recorder placed to monitor for atrial  fibrillation.  Supratentorial infarctslikely embolic Acute encephalopathy with gait disturbance  The presence of many supratentorial infarcts likely due to embolic event.   -TEE and loop recorder placement were completed 12/07/17. Yesterday 5/21 the patient had blood oozing from dressing site, but once dressing changed last night there has not been further bleeding. Cardiology evaluated loop recorder site and stated that it is clear and does not need any further management.  -Spoke to PT who recommended SNF as PM&R has declined CIR placement for patient.  -Atorvastatin 20mg  qd -aspirin 81mg  and plavix 75mg  for 3 weeks and then plavix alone  Mild rhabdomyolysis  Suspected Acute kidney injury  Resolved  Essential Hypertension The patient's blood pressure has been hypertensive ranging 132-158/95-102.  -Continue lisinopril-hctz 20-12.5mg  qd  Hx of BPH Elevated PSA Patient is scheduled to get a prostate biopsy done outpatient.   -Finasteride 5mg  qd  Dispo: Anticipated discharge in approximately today. Pending SNF placement. Appreciate social work assistance on discharge.   Lars Mage, MD 12/09/2017, 6:35 AM Pager: [phone]

## 2017-12-09 NOTE — Progress Notes (Signed)
Called report to RN Maudie Mercury at [facility].   Discharge summary reviewed with pt brother, teaching given. Copy provided to brother. Able to verbalize understanding and teach back. No new concerns.  Discharged from unit by staff in wheelchair. Brother to transport to [facility].

## 2017-12-22 ENCOUNTER — Ambulatory Visit (INDEPENDENT_AMBULATORY_CARE_PROVIDER_SITE_OTHER): Payer: Self-pay | Admitting: *Deleted

## 2017-12-22 DIAGNOSIS — I255 Ischemic cardiomyopathy: Secondary | ICD-10-CM

## 2017-12-22 LAB — CUP PACEART INCLINIC DEVICE CHECK
Implantable Pulse Generator Implant Date: 20190520
MDC IDC SESS DTM: 20190604104900

## 2017-12-22 NOTE — Progress Notes (Signed)
Wound check appointment. Steri-strips removed prior to appointment. Wound without redness or edema. Incision edges approximated, wound well healed. Battery status: Good. R-waves 0.83 mV. 0 symptom episodes, 0 tachy episodes, 0 pause episodes, 0 brady episodes. 0 AF episodes (0% burden). Monthly summary reports and ROV with WC PRN.

## 2018-01-08 ENCOUNTER — Ambulatory Visit: Payer: Medicaid Other | Admitting: Adult Health

## 2018-01-11 ENCOUNTER — Encounter: Payer: Self-pay | Admitting: Adult Health

## 2018-01-11 ENCOUNTER — Ambulatory Visit: Payer: Medicaid Other | Admitting: Adult Health

## 2018-01-11 ENCOUNTER — Ambulatory Visit (INDEPENDENT_AMBULATORY_CARE_PROVIDER_SITE_OTHER): Payer: Medicaid Other | Admitting: *Deleted

## 2018-01-11 VITALS — BP 107/74 | HR 98 | Ht 69.0 in | Wt 177.2 lb

## 2018-01-11 DIAGNOSIS — I10 Essential (primary) hypertension: Secondary | ICD-10-CM | POA: Diagnosis not present

## 2018-01-11 DIAGNOSIS — I255 Ischemic cardiomyopathy: Secondary | ICD-10-CM | POA: Diagnosis not present

## 2018-01-11 DIAGNOSIS — I639 Cerebral infarction, unspecified: Secondary | ICD-10-CM

## 2018-01-11 DIAGNOSIS — E785 Hyperlipidemia, unspecified: Secondary | ICD-10-CM

## 2018-01-11 NOTE — Progress Notes (Signed)
Carelink Summary Report / Loop Recorder 

## 2018-01-11 NOTE — Progress Notes (Signed)
I agree with the above plan 

## 2018-01-11 NOTE — Patient Instructions (Addendum)
Continue clopidogrel 75 mg daily  and lipitor  for secondary stroke prevention  Continue to follow up with PCP regarding cholesterol and blood pressure management   Continue to monitor blood pressure at home  Maintain strict control of hypertension with blood pressure goal below 130/90, diabetes with hemoglobin A1c goal below 6.5% and cholesterol with LDL cholesterol (bad cholesterol) goal below 70 mg/dL. I also advised the patient to eat a healthy diet with plenty of whole grains, cereals, fruits and vegetables, exercise regularly and maintain ideal body weight.  Followup in the future with me in 3 months or call earlier if needed       Thank you for coming to see [place] at [facility]. I hope we have been able to provide you high quality care today.  You may receive a patient satisfaction survey over the next few weeks. We would appreciate your feedback and comments so that we may continue to improve ourselves and the health of our patients.

## 2018-01-11 NOTE — Progress Notes (Signed)
[facility] [address] [place]. [place] [address] [id number]       OFFICE FOLLOW UP NOTE  [name] Date of Birth:  1956/07/16 Medical Record Number:  [id number]   Reason for Referral:  hospital stroke follow up  CHIEF COMPLAINT:  Chief Complaint  Patient presents with  . Follow-up    Stroke follow up, Pt was seen by Dr. Erlinda Hong in hospital pt room 9 with his mom Ardelia    HPI: Karl Miller is being seen today for initial visit in the office for left PCA and left MCA/ACA scattered infarcts with unclear etiology on 12/04/2017. History obtained from patient and chart review. Reviewed all radiology images and labs personally.  Karl Miller is a 62 y.o. male with history of previous strokes, hypertension, and hyperlipidemia,  who presented with unsteady gait and confusion. He did not receive IV t-PA due to late presentation.  CT head reviewed showed no acute infarct but did show old left cerebellum, right quadrant/BG/IC, left CR, and right MCA/PCA infarcts.  MRI head showed multiple small acute and subacute supratentorial infarcts along with multiple renal infarcts.  CTA head and neck were negative.  2D echo showed an EF of 45 to 50% with severe hypokinesis of the basal inferior myocardium.  TEE showed an EF of 45 to 50%, no PFO, negative bubble study, and nonmobile descending aortic plaque.  Loop recorder placed post TEE.  Bilateral lower extremity duplex was negative for DVT.  LDL 68 and has no statin was prescribed PTA was recommended to start Lipitor 20 mg daily.  A1c satisfactory at 5.7.  No antithrombotic prior to admission and recommended DAPT for 3 weeks and then Plavix alone.  Recommended discharge to CIR for continued therapies.   Patient is being seen today for hospital follow-up and is accompanied by his mother.  He was discharged to [facility] for additional therapies and returned home this past Friday (01/08/2018).  The mother was contacted on  Friday for home health services where he will be obtaining therapies but mother is unsure of what therapies he will be obtaining at this time.  He is currently living with his mother at this time.  He is currently using a cane for balance purposes.  He continues to take Plavix only without side effects of bleeding or bruising.  Continues to take Lipitor without side effects myalgias.  Blood pressure satisfactory at 107/74.  Loop recorder has not shown atrial fibrillation thus far.  Denies residual deficits from his stroke but during examination he continues to have memory loss but per mother, this has been improving.  Denies new or worsening stroke/TIA symptoms.   ROS:   14 system review of systems performed and negative with exception of memory loss  PMH:  Past Medical History:  Diagnosis Date  . Acute encephalopathy 12/03/2017   [name] 12/04/2017  . High cholesterol   . Hypertension   . Stroke ([place])   . TIA (transient ischemic attack) 12/03/2017   [name] 12/04/2017    PSH:  Past Surgical History:  Procedure Laterality Date  . LOOP RECORDER INSERTION N/A 12/07/2017   Procedure: LOOP RECORDER INSERTION;  Surgeon: Constance Haw, MD;  Location: [facility];  Service: Cardiovascular;  Laterality: N/A;  . NO PAST SURGERIES    . TEE WITHOUT CARDIOVERSION N/A 12/07/2017   Procedure: TRANSESOPHAGEAL ECHOCARDIOGRAM (TEE);  Surgeon: Dorothy Spark, MD;  Location: [facility];  Service: Cardiovascular;  Laterality: N/A;    Social  History:  Social History   Socioeconomic History  . Marital status: Single    Spouse name: Not on file  . Number of children: Not on file  . Years of education: Not on file  . Highest education level: Not on file  Occupational History  . Not on file  Social Needs  . Financial resource strain: Not on file  . Food insecurity:    Worry: Not on file    Inability: Not on file  . Transportation needs:    Medical: Not on file    Non-medical: Not  on file  Tobacco Use  . Smoking status: Current Every Day Smoker    Packs/day: 0.50    Years: 24.00    Pack years: 12.00    Types: Cigarettes  . Smokeless tobacco: Never Used  Substance and Sexual Activity  . Alcohol use: No    Frequency: Never  . Drug use: No  . Sexual activity: Not Currently  Lifestyle  . Physical activity:    Days per week: Not on file    Minutes per session: Not on file  . Stress: Not on file  Relationships  . Social connections:    Talks on phone: Not on file    Gets together: Not on file    Attends religious service: Not on file    Active member of club or organization: Not on file    Attends meetings of clubs or organizations: Not on file    Relationship status: Not on file  . Intimate partner violence:    Fear of current or ex partner: Not on file    Emotionally abused: Not on file    Physically abused: Not on file    Forced sexual activity: Not on file  Other Topics Concern  . Not on file  Social History Narrative  . Not on file    Family History: History reviewed. No pertinent family history.  Medications:   Current Outpatient Medications on File Prior to Visit  Medication Sig Dispense Refill  . ferrous sulfate 325 (65 FE) MG tablet Take 325 mg by mouth See admin instructions. Take 1 tablet on Monday, Wednesday, And Friday    . finasteride (PROSCAR) 5 MG tablet Take 5 mg by mouth daily.    Marland Kitchen lisinopril-hydrochlorothiazide (PRINZIDE,ZESTORETIC) 20-12.5 MG tablet Take 1 tablet by mouth daily.    . meloxicam (MOBIC) 7.5 MG tablet Take 1 tablet (7.5 mg total) by mouth daily. 10 tablet 0  . methocarbamol (ROBAXIN) 750 MG tablet Take 750 mg by mouth every 8 (eight) hours as needed for muscle spasms.    Marland Kitchen senna (SENOKOT) 8.6 MG TABS tablet Take 2 tablets by mouth daily.    . vitamin B-12 (CYANOCOBALAMIN) 500 MCG tablet Take 1,000 mcg by mouth daily.    Marland Kitchen atorvastatin (LIPITOR) 20 MG tablet Take 1 tablet (20 mg total) by mouth daily at 6 PM. 30  tablet 0   No current facility-administered medications on file prior to visit.     Allergies:  No Known Allergies   Physical Exam  Vitals:   01/11/18 1259  BP: 107/74  Pulse: 98  Weight: 177 lb 3.2 oz (80.4 kg)  Height: 5\' 9"  (1.753 m)   Body mass index is 26.17 kg/m. No exam data present  General: Frail elderly African-American male,, seated, in no evident distress Head: head normocephalic and atraumatic.   Neck: supple with no carotid or supraclavicular bruits Cardiovascular: regular rate and rhythm, no murmurs Musculoskeletal: no deformity Skin:  no rash/petichiae Vascular:  Normal pulses all extremities  Neurologic Exam Mental Status: Awake and fully alert.  Disoriented to place and time.  Remote memory intact. Attention span, concentration and fund of knowledge appropriate. Mood and affect appropriate.  Cranial Nerves: Fundoscopic exam reveals sharp disc margins. Pupils equal, briskly reactive to light. Extraocular movements full without nystagmus. Visual fields full to confrontation. Hearing intact. Facial sensation intact. Face, tongue, palate moves normally and symmetrically.  Motor: Normal bulk and tone. Normal strength in all tested extremity muscles. Sensory.: intact to touch , pinprick , position and vibratory sensation.  Coordination: Rapid alternating movements normal in all extremities. Finger-to-nose and heel-to-shin performed accurately bilaterally. Gait and Station: Arises from chair without difficulty. Stance is normal. Gait demonstrates normal stride length and balance . Able to heel, toe and tandem walk without difficulty.  Reflexes: 1+ and symmetric. Toes downgoing.    NIHSS  1 Modified Rankin  2    Diagnostic Data (Labs, Imaging, Testing)  Ct Head Wo Contrast 12/04/2017 Mild atrophy with multifocal prior infarcts and extensive supratentorial small vessel disease. No acute infarct evident. No mass or hemorrhage. Foci of arterial vascular  calcification noted. There is mucosal thickening in several ethmoid air cells. There is probable cerumen in the right external auditory canal.   Mr Brain Wo Contrast 12/04/2017 1. Motion degraded examination. Multiple small acute and subacute supratentorial infarcts.  2. Faint bifrontal flared hyperintense suggesting motion artifact, less likely atypical hypertensive encephalopathy or metabolic disturbance.  3. Old LEFT PICA and RIGHT PCA territory infarcts. Old bilateral basal ganglia and thalamus infarcts.  4. Moderate to severe chronic small vessel ischemic changes.  5. Moderate parenchymal brain volume loss.   CTA NECK 12/05/2017 1. Negative CTA of neck with no hemodynamically significant stenosis identified. Mild atherosclerotic change for age.  2. Extremely poor dentition with innumerable dental caries and periapical lucencies.  3. Emphysema.   CTA HEAD 12/05/2017 1. Negative CTA for large vessel occlusion.  2. Moderate atheromatous bilateral V4 stenoses.  3. No other hemodynamically significant or correctable stenosis identified.  4. Distal small vessel atheromatous irregularity.   Dg Chest 2 View 12/04/2017 No edema or consolidation.   Transthoracic Echocardiogram - Left ventricle: The cavity size was normal. Wall thickness wasincreased in a pattern of moderate LVH. Systolic function wasmildly reduced. The estimated ejection fraction was in the rangeof 45% to 50%. Diffuse hypokinesis. There is severe hypokinesisof the basalinferior myocardium. Doppler parameters areconsistent with abnormal left ventricular relaxation (grade 1diastolic dysfunction). - Mitral valve: Calcified annulus. Mildly thickened leaflets . Impressions: - Mild global hypokinesis with severe hypokinesis of the basalinferior wall; overall mild LV systolic dysfunction; moderateLVH; mild diastolic dysfunction.  Bilateral Lower Extremity Duplex  12/05/2017 Negative for DVT  TEE 12/07/2017 LVEF  45-50% diffuse hypokinesis No source of embolism, clean left atrial appendage,  Negative bubble study, no PFO Moderate non-mobile atheroma in the descending aorta.    ASSESSMENT: Trustin Chapa is a 63 y.o. 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 Vascular risk factors include HLD and HTN.    PLAN: -Continue clopidogrel 75 mg daily  and Lipitor for secondary stroke prevention -Interest in [place] trial and obtained information by research team -F/u with PCP regarding your HLD and HTN management -Continue home therapies -Reviewed brain exercises such as bridge, crossword, word search answer and suduku -continue to monitor BP at home -Maintain strict control of hypertension with blood pressure goal below 130/90, diabetes with hemoglobin A1c  goal below 6.5% and cholesterol with LDL cholesterol (bad cholesterol) goal below 70 mg/dL. I also advised the patient to eat a healthy diet with plenty of whole grains, cereals, fruits and vegetables, exercise regularly and maintain ideal body weight.  Follow up in 3 months or call earlier if needed   Greater than 50% of time during this 25 minute visit was spent on counseling,explanation of diagnosis of left PCA and left MCA/ACA scattered infarcts, reviewing risk factor management of HTN and HLD, planning of further management, discussion with patient and family and coordination of care   Venancio Poisson, [facility]  [facility] [address] [place], [place] [address]  Phone [phone] Fax [phone]

## 2018-01-13 ENCOUNTER — Telehealth: Payer: Self-pay | Admitting: *Deleted

## 2018-01-13 NOTE — Telephone Encounter (Signed)
Confirmed with patient's mother that he is taking clopidogrel 75mg  daily.  Added back onto med list.  Patient's mother denies additional questions or concerns at this time.

## 2018-01-22 ENCOUNTER — Encounter: Payer: Self-pay | Admitting: Neurology

## 2018-02-04 ENCOUNTER — Ambulatory Visit: Payer: Medicaid Other | Admitting: Physical Therapy

## 2018-02-05 ENCOUNTER — Other Ambulatory Visit: Payer: Self-pay | Admitting: Cardiology

## 2018-02-11 ENCOUNTER — Ambulatory Visit (INDEPENDENT_AMBULATORY_CARE_PROVIDER_SITE_OTHER): Payer: Medicaid Other | Admitting: *Deleted

## 2018-02-11 DIAGNOSIS — I255 Ischemic cardiomyopathy: Secondary | ICD-10-CM

## 2018-02-11 NOTE — Progress Notes (Signed)
Carelink Summary Report / Loop Recorder 

## 2018-02-16 LAB — CUP PACEART REMOTE DEVICE CHECK
Date Time Interrogation Session: 20190622164031
MDC IDC PG IMPLANT DT: 20190520

## 2018-03-16 ENCOUNTER — Ambulatory Visit (INDEPENDENT_AMBULATORY_CARE_PROVIDER_SITE_OTHER): Payer: Medicaid Other | Admitting: *Deleted

## 2018-03-16 DIAGNOSIS — I255 Ischemic cardiomyopathy: Secondary | ICD-10-CM | POA: Diagnosis not present

## 2018-03-16 DIAGNOSIS — G459 Transient cerebral ischemic attack, unspecified: Secondary | ICD-10-CM

## 2018-03-17 NOTE — Progress Notes (Signed)
Carelink Summary Report / Loop Recorder 

## 2018-03-23 ENCOUNTER — Telehealth: Payer: Self-pay | Admitting: Cardiology

## 2018-03-23 NOTE — Telephone Encounter (Signed)
Spoke w/ pt sister and requested that pt send a manual transmission b/c his home monitor has not updated in at least 14 days.

## 2018-03-26 ENCOUNTER — Ambulatory Visit: Payer: Medicaid Other | Admitting: Neurology

## 2018-03-29 LAB — CUP PACEART REMOTE DEVICE CHECK
Date Time Interrogation Session: 20190725173920
Implantable Pulse Generator Implant Date: 20190520

## 2018-04-13 ENCOUNTER — Ambulatory Visit: Payer: Medicaid Other | Admitting: Adult Health

## 2018-04-13 LAB — CUP PACEART REMOTE DEVICE CHECK
MDC IDC PG IMPLANT DT: 20190520
MDC IDC SESS DTM: 20190827183913

## 2018-04-13 NOTE — Progress Notes (Deleted)
[facility] [address] [place]. [place] [address] [phone]       OFFICE FOLLOW UP NOTE  [name] Date of Birth:  [birth year] Medical Record Number:  [id number]   Reason for Referral:  hospital stroke follow up  CHIEF COMPLAINT:  No chief complaint on file.   HPI: Karl Miller is being seen today in the office for left PCA and left MCA/ACA scattered infarcts with unclear etiology on 12/04/2017. History obtained from patient and chart review. Reviewed all radiology images and labs personally.  [name] is a 62 y.o. male with history of previous strokes, hypertension, and hyperlipidemia,  who presented with unsteady gait and confusion. He did not receive IV t-PA due to late presentation.  CT head reviewed showed no acute infarct but did show old left cerebellum, right quadrant/BG/IC, left CR, and right MCA/PCA infarcts.  MRI head showed multiple small acute and subacute supratentorial infarcts along with multiple renal infarcts.  CTA head and neck were negative.  2D echo showed an EF of 45 to 50% with severe hypokinesis of the basal inferior myocardium.  TEE showed an EF of 45 to 50%, no PFO, negative bubble study, and nonmobile descending aortic plaque.  Loop recorder placed post TEE.  Bilateral lower extremity duplex was negative for DVT.  LDL 68 and has no statin was prescribed PTA was recommended to start Lipitor 20 mg daily.  A1c satisfactory at 5.7.  No antithrombotic prior to admission and recommended DAPT for 3 weeks and then Plavix alone.  Recommended discharge to CIR for continued therapies.   01/11/2018 visit: Patient is being seen today for hospital follow-up and is accompanied by his mother.  He was discharged to [facility] for additional therapies and returned home this past Friday (01/08/2018).  The mother was contacted on Friday for home health services where he will be obtaining therapies but mother is unsure of what therapies he will be  obtaining at this time.  He is currently living with his mother at this time.  He is currently using a cane for balance purposes.  He continues to take Plavix only without side effects of bleeding or bruising.  Continues to take Lipitor without side effects myalgias.  Blood pressure satisfactory at 107/74.  Loop recorder has not shown atrial fibrillation thus far.  Denies residual deficits from his stroke but during examination he continues to have memory loss but per mother, this has been improving.  Denies new or worsening stroke/TIA symptoms.  Interval history 04/13/2018: Patient is being seen today for scheduled follow-up visit.  Loop recorder has not shown atrial fibrillation thus far.   ROS:   14 system review of systems performed and negative with exception of memory loss  PMH:  Past Medical History:  Diagnosis Date  . Acute encephalopathy 12/03/2017   [name] 12/04/2017  . High cholesterol   . Hypertension   . Stroke ([place])   . TIA (transient ischemic attack) 12/03/2017   [name] 12/04/2017    PSH:  Past Surgical History:  Procedure Laterality Date  . LOOP RECORDER INSERTION N/A 12/07/2017   Procedure: LOOP RECORDER INSERTION;  Surgeon: Constance Haw, MD;  Location: [facility];  Service: Cardiovascular;  Laterality: N/A;  . NO PAST SURGERIES    . TEE WITHOUT CARDIOVERSION N/A 12/07/2017   Procedure: TRANSESOPHAGEAL ECHOCARDIOGRAM (TEE);  Surgeon: Dorothy Spark, MD;  Location: [facility];  Service: Cardiovascular;  Laterality: N/A;    Social History:  Social History  Socioeconomic History  . Marital status: Single    Spouse name: Not on file  . Number of children: Not on file  . Years of education: Not on file  . Highest education level: Not on file  Occupational History  . Not on file  Social Needs  . Financial resource strain: Not on file  . Food insecurity:    Worry: Not on file    Inability: Not on file  . Transportation needs:    Medical: Not  on file    Non-medical: Not on file  Tobacco Use  . Smoking status: Current Every Day Smoker    Packs/day: 0.50    Years: 24.00    Pack years: 12.00    Types: Cigarettes  . Smokeless tobacco: Never Used  Substance and Sexual Activity  . Alcohol use: No    Frequency: Never  . Drug use: No  . Sexual activity: Not Currently  Lifestyle  . Physical activity:    Days per week: Not on file    Minutes per session: Not on file  . Stress: Not on file  Relationships  . Social connections:    Talks on phone: Not on file    Gets together: Not on file    Attends religious service: Not on file    Active member of club or organization: Not on file    Attends meetings of clubs or organizations: Not on file    Relationship status: Not on file  . Intimate partner violence:    Fear of current or ex partner: Not on file    Emotionally abused: Not on file    Physically abused: Not on file    Forced sexual activity: Not on file  Other Topics Concern  . Not on file  Social History Narrative  . Not on file    Family History: No family history on file.  Medications:   Current Outpatient Medications on File Prior to Visit  Medication Sig Dispense Refill  . atorvastatin (LIPITOR) 20 MG tablet Take 1 tablet (20 mg total) by mouth daily at 6 PM. 30 tablet 0  . clopidogrel (PLAVIX) 75 MG tablet Take 75 mg by mouth daily.    . ferrous sulfate 325 (65 FE) MG tablet Take 325 mg by mouth See admin instructions. Take 1 tablet on Monday, Wednesday, And Friday    . finasteride (PROSCAR) 5 MG tablet Take 5 mg by mouth daily.    Marland Kitchen lisinopril-hydrochlorothiazide (PRINZIDE,ZESTORETIC) 20-12.5 MG tablet Take 1 tablet by mouth daily.    . meloxicam (MOBIC) 7.5 MG tablet Take 1 tablet (7.5 mg total) by mouth daily. 10 tablet 0  . methocarbamol (ROBAXIN) 750 MG tablet Take 750 mg by mouth every 8 (eight) hours as needed for muscle spasms.    Marland Kitchen senna (SENOKOT) 8.6 MG TABS tablet Take 2 tablets by mouth daily.      . vitamin B-12 (CYANOCOBALAMIN) 500 MCG tablet Take 1,000 mcg by mouth daily.     No current facility-administered medications on file prior to visit.     Allergies:  No Known Allergies   Physical Exam  There were no vitals filed for this visit. There is no height or weight on file to calculate BMI. No exam data present  General: Frail elderly African-American male,, seated, in no evident distress Head: head normocephalic and atraumatic.   Neck: supple with no carotid or supraclavicular bruits Cardiovascular: regular rate and rhythm, no murmurs Musculoskeletal: no deformity Skin:  no rash/petichiae Vascular:  Normal pulses all extremities  Neurologic Exam Mental Status: Awake and fully alert.  Disoriented to place and time.  Remote memory intact. Attention span, concentration and fund of knowledge appropriate. Mood and affect appropriate.  Cranial Nerves: Fundoscopic exam reveals sharp disc margins. Pupils equal, briskly reactive to light. Extraocular movements full without nystagmus. Visual fields full to confrontation. Hearing intact. Facial sensation intact. Face, tongue, palate moves normally and symmetrically.  Motor: Normal bulk and tone. Normal strength in all tested extremity muscles. Sensory.: intact to touch , pinprick , position and vibratory sensation.  Coordination: Rapid alternating movements normal in all extremities. Finger-to-nose and heel-to-shin performed accurately bilaterally. Gait and Station: Arises from chair without difficulty. Stance is normal. Gait demonstrates normal stride length and balance . Able to heel, toe and tandem walk without difficulty.  Reflexes: 1+ and symmetric. Toes downgoing.    NIHSS  1 Modified Rankin  2    Diagnostic Data (Labs, Imaging, Testing)  Ct Head Wo Contrast 12/04/2017 Mild atrophy with multifocal prior infarcts and extensive supratentorial small vessel disease. No acute infarct evident. No mass or hemorrhage. Foci of  arterial vascular calcification noted. There is mucosal thickening in several ethmoid air cells. There is probable cerumen in the right external auditory canal.   Mr Brain Wo Contrast 12/04/2017 1. Motion degraded examination. Multiple small acute and subacute supratentorial infarcts.  2. Faint bifrontal flared hyperintense suggesting motion artifact, less likely atypical hypertensive encephalopathy or metabolic disturbance.  3. Old LEFT PICA and RIGHT PCA territory infarcts. Old bilateral basal ganglia and thalamus infarcts.  4. Moderate to severe chronic small vessel ischemic changes.  5. Moderate parenchymal brain volume loss.   CTA NECK 12/05/2017 1. Negative CTA of neck with no hemodynamically significant stenosis identified. Mild atherosclerotic change for age.  2. Extremely poor dentition with innumerable dental caries and periapical lucencies.  3. Emphysema.   CTA HEAD 12/05/2017 1. Negative CTA for large vessel occlusion.  2. Moderate atheromatous bilateral V4 stenoses.  3. No other hemodynamically significant or correctable stenosis identified.  4. Distal small vessel atheromatous irregularity.   Dg Chest 2 View 12/04/2017 No edema or consolidation.   Transthoracic Echocardiogram - Left ventricle: The cavity size was normal. Wall thickness wasincreased in a pattern of moderate LVH. Systolic function wasmildly reduced. The estimated ejection fraction was in the rangeof 45% to 50%. Diffuse hypokinesis. There is severe hypokinesisof the basalinferior myocardium. Doppler parameters areconsistent with abnormal left ventricular relaxation (grade 1diastolic dysfunction). - Mitral valve: Calcified annulus. Mildly thickened leaflets . Impressions: - Mild global hypokinesis with severe hypokinesis of the basalinferior wall; overall mild LV systolic dysfunction; moderateLVH; mild diastolic dysfunction.  Bilateral Lower Extremity Duplex  12/05/2017 Negative for  DVT  TEE 12/07/2017 LVEF 45-50% diffuse hypokinesis No source of embolism, clean left atrial appendage,  Negative bubble study, no PFO Moderate non-mobile atheroma in the descending aorta.    ASSESSMENT: Karl Miller is a 62 y.o. 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 Vascular risk factors include HLD and HTN.    PLAN: -Continue clopidogrel 75 mg daily  and Lipitor for secondary stroke prevention -Interest in [place] trial and obtained information by research team -F/u with PCP regarding your HLD and HTN management -Continue home therapies -Reviewed brain exercises such as bridge, crossword, word search answer and suduku -continue to monitor BP at home -Maintain strict control of hypertension with blood pressure goal below 130/90, diabetes with hemoglobin A1c goal below 6.5% and  cholesterol with LDL cholesterol (bad cholesterol) goal below 70 mg/dL. I also advised the patient to eat a healthy diet with plenty of whole grains, cereals, fruits and vegetables, exercise regularly and maintain ideal body weight.  Follow up in 3 months or call earlier if needed   Greater than 50% of time during this 25 minute visit was spent on counseling,explanation of diagnosis of left PCA and left MCA/ACA scattered infarcts, reviewing risk factor management of HTN and HLD, planning of further management, discussion with patient and family and coordination of care   Venancio Poisson, [facility]  [facility] [address] [place], [place] [address]  Phone [phone] Fax [phone]

## 2018-04-14 ENCOUNTER — Encounter: Payer: Self-pay | Admitting: Adult Health

## 2018-04-15 ENCOUNTER — Telehealth: Payer: Self-pay

## 2018-04-15 NOTE — Telephone Encounter (Signed)
Patient no show for appt on 04/13/2018. 

## 2018-04-19 ENCOUNTER — Ambulatory Visit (INDEPENDENT_AMBULATORY_CARE_PROVIDER_SITE_OTHER): Payer: Medicaid Other | Admitting: *Deleted

## 2018-04-19 DIAGNOSIS — I639 Cerebral infarction, unspecified: Secondary | ICD-10-CM

## 2018-04-19 DIAGNOSIS — I255 Ischemic cardiomyopathy: Secondary | ICD-10-CM

## 2018-04-19 NOTE — Progress Notes (Signed)
Carelink Summary Report / Loop Recorder 

## 2018-04-21 LAB — CUP PACEART REMOTE DEVICE CHECK
Implantable Pulse Generator Implant Date: 20190520
MDC IDC SESS DTM: 20190929183745

## 2018-05-21 ENCOUNTER — Ambulatory Visit (INDEPENDENT_AMBULATORY_CARE_PROVIDER_SITE_OTHER): Payer: Medicaid Other | Admitting: *Deleted

## 2018-05-21 DIAGNOSIS — I639 Cerebral infarction, unspecified: Secondary | ICD-10-CM | POA: Diagnosis not present

## 2018-05-23 NOTE — Progress Notes (Signed)
Carelink Summary Report / Loop Recorder 

## 2018-05-27 ENCOUNTER — Telehealth: Payer: Self-pay

## 2018-05-27 NOTE — Telephone Encounter (Signed)
LMOVM requesting that pt send manual transmission b/c home monitor has not updated in at least 14 days.  No answer

## 2018-06-11 LAB — CUP PACEART REMOTE DEVICE CHECK
Implantable Pulse Generator Implant Date: 20190520
MDC IDC SESS DTM: 20191101183616

## 2018-06-14 ENCOUNTER — Encounter: Payer: Self-pay | Admitting: Cardiology

## 2018-06-23 ENCOUNTER — Ambulatory Visit (INDEPENDENT_AMBULATORY_CARE_PROVIDER_SITE_OTHER): Payer: Medicaid Other

## 2018-06-23 DIAGNOSIS — I639 Cerebral infarction, unspecified: Secondary | ICD-10-CM | POA: Diagnosis not present

## 2018-06-24 NOTE — Progress Notes (Signed)
Carelink Summary Report / Loop Recorder 

## 2018-06-30 ENCOUNTER — Encounter: Payer: Self-pay | Admitting: Cardiology

## 2018-07-26 ENCOUNTER — Ambulatory Visit (INDEPENDENT_AMBULATORY_CARE_PROVIDER_SITE_OTHER): Payer: Medicaid Other

## 2018-07-26 DIAGNOSIS — I639 Cerebral infarction, unspecified: Secondary | ICD-10-CM | POA: Diagnosis not present

## 2018-07-26 LAB — CUP PACEART REMOTE DEVICE CHECK
Date Time Interrogation Session: 20200106172802
MDC IDC PG IMPLANT DT: 20190520

## 2018-07-27 NOTE — Progress Notes (Signed)
Carelink Summary Report / Loop Recorder 

## 2018-08-06 LAB — CUP PACEART REMOTE DEVICE CHECK
Implantable Pulse Generator Implant Date: 20190520
MDC IDC SESS DTM: 20191204183818

## 2018-08-30 ENCOUNTER — Ambulatory Visit (INDEPENDENT_AMBULATORY_CARE_PROVIDER_SITE_OTHER): Payer: Medicaid Other

## 2018-08-30 DIAGNOSIS — I639 Cerebral infarction, unspecified: Secondary | ICD-10-CM | POA: Diagnosis not present

## 2018-08-31 LAB — CUP PACEART REMOTE DEVICE CHECK
Date Time Interrogation Session: 20200208190637
MDC IDC PG IMPLANT DT: 20190520

## 2018-09-10 NOTE — Progress Notes (Signed)
Carelink Summary Report / Loop Recorder 

## 2018-09-30 ENCOUNTER — Ambulatory Visit (INDEPENDENT_AMBULATORY_CARE_PROVIDER_SITE_OTHER): Payer: Medicaid Other | Admitting: *Deleted

## 2018-09-30 DIAGNOSIS — I639 Cerebral infarction, unspecified: Secondary | ICD-10-CM

## 2018-10-02 LAB — CUP PACEART REMOTE DEVICE CHECK
Implantable Pulse Generator Implant Date: 20190520
MDC IDC SESS DTM: 20200312194227

## 2018-10-07 NOTE — Progress Notes (Signed)
Carelink Summary Report / Loop Recorder 

## 2018-10-28 ENCOUNTER — Other Ambulatory Visit: Payer: Self-pay

## 2018-10-28 ENCOUNTER — Encounter (HOSPITAL_COMMUNITY): Payer: Self-pay | Admitting: Emergency Medicine

## 2018-10-28 ENCOUNTER — Emergency Department (HOSPITAL_COMMUNITY)
Admission: EM | Admit: 2018-10-28 | Discharge: 2018-10-29 | Disposition: A | Discharge status: Home / Self Care | Payer: No Typology Code available for payment source | Attending: Emergency Medicine | Admitting: Emergency Medicine

## 2018-10-28 DIAGNOSIS — E860 Dehydration: Secondary | ICD-10-CM

## 2018-10-28 DIAGNOSIS — N183 Chronic kidney disease, stage 3 (moderate): Secondary | ICD-10-CM | POA: Diagnosis not present

## 2018-10-28 DIAGNOSIS — Z95818 Presence of other cardiac implants and grafts: Secondary | ICD-10-CM | POA: Diagnosis not present

## 2018-10-28 DIAGNOSIS — R42 Dizziness and giddiness: Secondary | ICD-10-CM | POA: Diagnosis present

## 2018-10-28 DIAGNOSIS — Z7902 Long term (current) use of antithrombotics/antiplatelets: Secondary | ICD-10-CM | POA: Diagnosis not present

## 2018-10-28 DIAGNOSIS — F0390 Unspecified dementia without behavioral disturbance: Secondary | ICD-10-CM | POA: Insufficient documentation

## 2018-10-28 DIAGNOSIS — I951 Orthostatic hypotension: Secondary | ICD-10-CM | POA: Insufficient documentation

## 2018-10-28 DIAGNOSIS — F17210 Nicotine dependence, cigarettes, uncomplicated: Secondary | ICD-10-CM | POA: Diagnosis not present

## 2018-10-28 DIAGNOSIS — Z79899 Other long term (current) drug therapy: Secondary | ICD-10-CM | POA: Diagnosis not present

## 2018-10-28 DIAGNOSIS — I130 Hypertensive heart and chronic kidney disease with heart failure and stage 1 through stage 4 chronic kidney disease, or unspecified chronic kidney disease: Secondary | ICD-10-CM | POA: Insufficient documentation

## 2018-10-28 DIAGNOSIS — I5030 Unspecified diastolic (congestive) heart failure: Secondary | ICD-10-CM | POA: Diagnosis not present

## 2018-10-28 DIAGNOSIS — D649 Anemia, unspecified: Secondary | ICD-10-CM | POA: Diagnosis not present

## 2018-10-28 LAB — CBC WITH DIFFERENTIAL/PLATELET
Abs Immature Granulocytes: 0.03 10*3/uL (ref 0.00–0.07)
Basophils Absolute: 0 10*3/uL (ref 0.0–0.1)
Basophils Relative: 1 %
Eosinophils Absolute: 0.1 10*3/uL (ref 0.0–0.5)
Eosinophils Relative: 2 %
HCT: 27.4 % — ABNORMAL LOW (ref 39.0–52.0)
Hemoglobin: 8.7 g/dL — ABNORMAL LOW (ref 13.0–17.0)
Immature Granulocytes: 1 %
Lymphocytes Relative: 12 %
Lymphs Abs: 0.7 10*3/uL (ref 0.7–4.0)
MCH: 29.7 pg (ref 26.0–34.0)
MCHC: 31.8 g/dL (ref 30.0–36.0)
MCV: 93.5 fL (ref 80.0–100.0)
Monocytes Absolute: 0.6 10*3/uL (ref 0.1–1.0)
Monocytes Relative: 11 %
Neutro Abs: 4.3 10*3/uL (ref 1.7–7.7)
Neutrophils Relative %: 73 %
Platelets: 283 10*3/uL (ref 150–400)
RBC: 2.93 MIL/uL — ABNORMAL LOW (ref 4.22–5.81)
RDW: 13.2 % (ref 11.5–15.5)
WBC: 5.8 10*3/uL (ref 4.0–10.5)
nRBC: 0 % (ref 0.0–0.2)

## 2018-10-28 LAB — CBG MONITORING, ED: Glucose-Capillary: 113 mg/dL — ABNORMAL HIGH (ref 70–99)

## 2018-10-28 LAB — COMPREHENSIVE METABOLIC PANEL
ALT: 15 U/L (ref 0–44)
AST: 23 U/L (ref 15–41)
Albumin: 3.3 g/dL — ABNORMAL LOW (ref 3.5–5.0)
Alkaline Phosphatase: 41 U/L (ref 38–126)
Anion gap: 8 (ref 5–15)
BUN: 35 mg/dL — ABNORMAL HIGH (ref 8–23)
CO2: 27 mmol/L (ref 22–32)
Calcium: 9.8 mg/dL (ref 8.9–10.3)
Chloride: 105 mmol/L (ref 98–111)
Creatinine, Ser: 2.03 mg/dL — ABNORMAL HIGH (ref 0.61–1.24)
GFR calc Af Amer: 39 mL/min — ABNORMAL LOW (ref >60)
GFR calc non Af Amer: 33 mL/min — ABNORMAL LOW (ref >60)
Glucose, Bld: 128 mg/dL — ABNORMAL HIGH (ref 70–99)
Potassium: 3.8 mmol/L (ref 3.5–5.1)
Sodium: 140 mmol/L (ref 135–145)
Total Bilirubin: 0.1 mg/dL — ABNORMAL LOW (ref 0.3–1.2)
Total Protein: 6.3 g/dL — ABNORMAL LOW (ref 6.5–8.1)

## 2018-10-28 MED ORDER — SODIUM CHLORIDE 0.9 % IV BOLUS
500.0000 mL | Freq: Once | INTRAVENOUS | Status: AC
  Administered 2018-10-28: 500 mL via INTRAVENOUS

## 2018-10-28 NOTE — ED Notes (Signed)
ED Provider at bedside. 

## 2018-10-28 NOTE — ED Triage Notes (Signed)
Pt arrived from [facility] for c/o dizziness upon standing, EMS stroke screen negative. Pt initial BP witjh EMS 96 systolic. Pt received 5101mL NS bolus via 18G IV RAC. Pt reports feeling significantly better since fluids. Vital signs with EMS BP128/84 [id number] Per EMS pt was tested for COVID at [facility] and was negative. No reported symptoms. EMS states that he reported back pain on scene but denies pain at this time. Pt is confused at baseline, currently oriented to person, place, and situation.

## 2018-10-28 NOTE — ED Notes (Signed)
While doing orthostatic VS pt c/o increased weakness when changing positions.

## 2018-10-28 NOTE — ED Provider Notes (Addendum)
MSE was initiated and I personally evaluated the patient and placed orders (if any) at  10:42 PM on October 28, 2018.  The patient appears stable so that the remainder of the MSE may be completed by another provider.  Patient brought in by EMS for dizziness with standing.  Given normal saline 500 mils in route.  Patient states he is feeling much better.  Vital signs are stable.  No focal neurologic deficits.   Julianne Rice, MD 10/28/18 3568    Julianne Rice, MD 10/28/18 2243

## 2018-10-29 LAB — URINALYSIS, ROUTINE W REFLEX MICROSCOPIC
Bilirubin Urine: NEGATIVE
Glucose, UA: NEGATIVE mg/dL
Hgb urine dipstick: NEGATIVE
Ketones, ur: NEGATIVE mg/dL
Leukocytes,Ua: NEGATIVE
Nitrite: NEGATIVE
Protein, ur: NEGATIVE mg/dL
Specific Gravity, Urine: 1.02 (ref 1.005–1.030)
pH: 5 (ref 5.0–8.0)

## 2018-10-29 LAB — BASIC METABOLIC PANEL
Anion gap: 10 (ref 5–15)
BUN: 33 mg/dL — ABNORMAL HIGH (ref 8–23)
CO2: 26 mmol/L (ref 22–32)
Calcium: 9.8 mg/dL (ref 8.9–10.3)
Chloride: 105 mmol/L (ref 98–111)
Creatinine, Ser: 1.76 mg/dL — ABNORMAL HIGH (ref 0.61–1.24)
GFR calc Af Amer: 46 mL/min — ABNORMAL LOW (ref >60)
GFR calc non Af Amer: 40 mL/min — ABNORMAL LOW (ref >60)
Glucose, Bld: 103 mg/dL — ABNORMAL HIGH (ref 70–99)
Potassium: 3.7 mmol/L (ref 3.5–5.1)
Sodium: 141 mmol/L (ref 135–145)

## 2018-10-29 NOTE — ED Notes (Signed)
Attempted report to [facility] x 2

## 2018-10-29 NOTE — ED Provider Notes (Signed)
[facility] EMERGENCY DEPARTMENT Provider Note  CSN: [id number] Arrival date & time: 10/28/18 2215  Chief Complaint(s) Dizziness and Back Pain  HPI Karl Miller is a 63 y.o. male patient with a history of CVA, dementia who presents to the emergency department from skilled nursing facility for orthostasis upon standing.  Patient brought in by EMS who reported that on their initial assessment patient had a systolic blood pressure of 96.  Patient was given 500 cc IV fluid bolus in route.  No recent fevers or infections.  EMS reports that facility tested the patient for coded and was negative.  EMS also reported that the patient was initially complaining of back pain but currently patient denies any back pain at this time.  Denies any other physical complaints.  Remainder of history, ROS, and physical exam limited due to patient's condition (dementia). Additional information was obtained from EMS.   Level V Caveat.     HPI  Past Medical History Past Medical History:  Diagnosis Date  . Acute encephalopathy 12/03/2017   [name] 12/04/2017  . High cholesterol   . Hypertension   . Stroke ([place])   . TIA (transient ischemic attack) 12/03/2017   [name] 12/04/2017   Patient Active Problem List   Diagnosis Date Noted  . Benign essential HTN   . Tobacco abuse   . TIA (transient ischemic attack)   . Diastolic dysfunction   . Prediabetes   . Acute blood loss anemia   . Stage 3 chronic kidney disease ([place])   . Hyperlipidemia   . Syphilis   . Cerebral embolism with cerebral infarction 12/05/2017  . Smoker   . Acute encephalopathy 12/04/2017  . HTN (hypertension) 12/04/2017  . AKI (acute kidney injury) ([place]) 12/04/2017  . Elevated PSA 12/04/2017  . Rhabdomyolysis 12/04/2017   Home Medication(s) Prior to Admission medications   Medication Sig Start Date End Date Taking? Authorizing Provider  atorvastatin (LIPITOR) 20 MG tablet Take 1 tablet (20 mg total) by mouth  daily at 6 PM. 12/09/17 10/28/18  Lars Mage, MD  clopidogrel (PLAVIX) 75 MG tablet Take 75 mg by mouth daily.    [provider]  ferrous sulfate 325 (65 FE) MG tablet Take 325 mg by mouth See admin instructions. Take 1 tablet on Monday, Wednesday, And Friday    [provider]  finasteride (PROSCAR) 5 MG tablet Take 5 mg by mouth daily.    [provider]  lisinopril-hydrochlorothiazide (PRINZIDE,ZESTORETIC) 20-12.5 MG tablet Take 1 tablet by mouth daily.    [provider]  meloxicam (MOBIC) 7.5 MG tablet Take 1 tablet (7.5 mg total) by mouth daily. 06/23/17   Fransico Meadow, PA-C  methocarbamol (ROBAXIN) 750 MG tablet Take 750 mg by mouth every 8 (eight) hours as needed for muscle spasms.    [provider]  senna (SENOKOT) 8.6 MG TABS tablet Take 2 tablets by mouth daily.    [provider]  vitamin B-12 (CYANOCOBALAMIN) 500 MCG tablet Take 1,000 mcg by mouth daily.    [provider]  Past Surgical History Past Surgical History:  Procedure Laterality Date  . LOOP RECORDER INSERTION N/A 12/07/2017   Procedure: LOOP RECORDER INSERTION;  Surgeon: Constance Haw, MD;  Location: [place] [facility];  Service: Cardiovascular;  Laterality: N/A;  . NO PAST SURGERIES    . TEE WITHOUT CARDIOVERSION N/A 12/07/2017   Procedure: TRANSESOPHAGEAL ECHOCARDIOGRAM (TEE);  Surgeon: Dorothy Spark, MD;  Location: [facility] ENDOSCOPY;  Service: Cardiovascular;  Laterality: N/A;   Family History No family history on file.  Social History Social History   Tobacco Use  . Smoking status: Current Every Day Smoker    Packs/day: 0.50    Years: 24.00    Pack years: 12.00    Types: Cigarettes  . Smokeless tobacco: Never Used  Substance Use Topics  . Alcohol use: No    Frequency: Never  . Drug use: No   Allergies  Patient has no known allergies.  Review of Systems Review of Systems  Unable to perform ROS: Dementia     Physical Exam Vital Signs  I have reviewed the triage vital signs BP 139/89 (BP Location: Left Arm)   Pulse 92   Temp 97.9 F (36.6 C) (Oral)   Resp 18   SpO2 99%   Physical Exam Vitals signs reviewed.  Constitutional:      General: He is not in acute distress.    Appearance: He is well-developed. He is not diaphoretic.  HENT:     Head: Normocephalic and atraumatic.     Nose: Nose normal.  Eyes:     General: No scleral icterus.       Right eye: No discharge.        Left eye: No discharge.     Conjunctiva/sclera: Conjunctivae normal.     Pupils: Pupils are equal, round, and reactive to light.  Neck:     Musculoskeletal: Normal range of motion and neck supple.  Cardiovascular:     Rate and Rhythm: Normal rate and regular rhythm.     Heart sounds: No murmur. No friction rub. No gallop.   Pulmonary:     Effort: Pulmonary effort is normal. No respiratory distress.     Breath sounds: Normal breath sounds. No stridor. No rales.  Abdominal:     General: There is no distension.     Palpations: Abdomen is soft.     Tenderness: There is no abdominal tenderness.  Musculoskeletal:        General: No tenderness.  Skin:    General: Skin is warm and dry.     Findings: No erythema or rash.  Neurological:     Mental Status: He is alert. He is disoriented.     Comments: Oriented to person and place. Not time. Follows commands. Mild right sided upper and lower extremity increased tone. 4+/5 in right extremities. 5/5 in left extremities.     ED Results and Treatments Labs (all labs ordered are listed, but only abnormal results are displayed) Labs Reviewed  CBC WITH DIFFERENTIAL/PLATELET - Abnormal; Notable for the following components:      Result Value   RBC 2.93 (*)    Hemoglobin 8.7 (*)    HCT 27.4 (*)    All other components within normal limits  COMPREHENSIVE  METABOLIC PANEL - Abnormal; Notable for the following components:   Glucose, Bld 128 (*)    BUN 35 (*)    Creatinine, Ser 2.03 (*)    Total Protein 6.3 (*)    Albumin 3.3 (*)  Total Bilirubin 0.1 (*)    GFR calc non Af Amer 33 (*)    GFR calc Af Amer 39 (*)    All other components within normal limits  BASIC METABOLIC PANEL - Abnormal; Notable for the following components:   Glucose, Bld 103 (*)    BUN 33 (*)    Creatinine, Ser 1.76 (*)    GFR calc non Af Amer 40 (*)    GFR calc Af Amer 46 (*)    All other components within normal limits  CBG MONITORING, ED - Abnormal; Notable for the following components:   Glucose-Capillary 113 (*)    All other components within normal limits  URINALYSIS, ROUTINE W REFLEX MICROSCOPIC                                                                                                                         EKG  EKG Interpretation  Date/Time: 10/28/2018 23:52:05  Ventricular Rate:  72  PR Interval:   60 QRS Duration:  111 QT Interval:   401 QTC Calculation:  439 R Axis:     Text Interpretation: Normal sinus rhythm.  Short PR interval.  Age-indeterminate inferior artifact.  No significant changes from prior EKG tracings.  Interpreted by Addison Lank MD      Radiology No results found. Pertinent labs & imaging results that were available during my care of the patient were reviewed by me and considered in my medical decision making (see chart for details).  Medications Ordered in ED Medications  sodium chloride 0.9 % bolus 500 mL (0 mLs Intravenous Stopped 10/29/18 0211)                                                                                                                                    Procedures Procedures  (including critical care time)  Medical Decision Making / ED Course I have reviewed the nursing notes for this encounter and the patient's prior records (if available in EHR or on provided paperwork).    Patient  presents with symptoms of orthostasis.  Initial blood pressures here reassuring.  Orthostatics are also reassuring but patient appeared to be symptomatic upon standing.  He was provided with additional IV fluids.  Screening labs obtain patient noted to have mildly worsened renal insufficiency which improved after the additional 500 cc bolus.  Work-up also notable for normocytic anemia with a hemoglobin of 8.7  which is approximately 2-1/2 g drop in 10 months.  Patient will need outpatient follow-up for further evaluation.  UA without evidence of infection.  EKG without acute ischemic changes or blocks.  Following IV hydration, patient was able to ambulate without complication.  Tolerated oral intake.  The patient appears reasonably screened and/or stabilized for discharge and I doubt any other medical condition or other [facility] requiring further screening, evaluation, or treatment in the ED at this time prior to discharge.  The patient is safe for discharge with strict return precautions.   Final Clinical Impression(s) / ED Diagnoses Final diagnoses:  Orthostasis  Dehydration  Anemia, unspecified type    Disposition: Discharge  Condition: Good    ED Discharge Orders    None       Follow Up: Administration, [place] [address] [phone]  Schedule an appointment as soon as possible for a visit  to assess for anemia cause    This chart was dictated using voice recognition software.  Despite best efforts to proofread,  errors can occur which can change the documentation meaning.   Fatima Blank, MD 10/29/18 [phone]

## 2018-10-29 NOTE — ED Notes (Signed)
Pt noted to be ambulating around room unassisted and removed IV, catheter intact. Pt given sandwich and water

## 2018-11-02 ENCOUNTER — Ambulatory Visit (INDEPENDENT_AMBULATORY_CARE_PROVIDER_SITE_OTHER): Payer: Medicare Other | Admitting: *Deleted

## 2018-11-02 ENCOUNTER — Other Ambulatory Visit: Payer: Self-pay

## 2018-11-02 DIAGNOSIS — I639 Cerebral infarction, unspecified: Secondary | ICD-10-CM

## 2018-11-02 LAB — CUP PACEART REMOTE DEVICE CHECK
Date Time Interrogation Session: 20200414194514
Implantable Pulse Generator Implant Date: 20190520

## 2018-11-09 NOTE — Progress Notes (Signed)
Carelink Summary Report / Loop Recorder 

## 2018-11-15 ENCOUNTER — Other Ambulatory Visit: Payer: Self-pay

## 2018-11-15 ENCOUNTER — Inpatient Hospital Stay (HOSPITAL_COMMUNITY): Payer: Medicare Other

## 2018-11-15 ENCOUNTER — Encounter (HOSPITAL_COMMUNITY): Payer: Self-pay | Admitting: Emergency Medicine

## 2018-11-15 ENCOUNTER — Inpatient Hospital Stay (HOSPITAL_COMMUNITY)
Admission: EM | Admit: 2018-11-15 | Discharge: 2018-11-19 | DRG: 065 | Disposition: A | Discharge status: Skilled Nursing Facility | Payer: Medicare Other | Attending: Family Medicine | Admitting: Family Medicine

## 2018-11-15 ENCOUNTER — Emergency Department (HOSPITAL_COMMUNITY): Payer: Medicare Other

## 2018-11-15 DIAGNOSIS — R29810 Facial weakness: Secondary | ICD-10-CM | POA: Diagnosis present

## 2018-11-15 DIAGNOSIS — N400 Enlarged prostate without lower urinary tract symptoms: Secondary | ICD-10-CM | POA: Diagnosis present

## 2018-11-15 DIAGNOSIS — I5032 Chronic diastolic (congestive) heart failure: Secondary | ICD-10-CM | POA: Diagnosis present

## 2018-11-15 DIAGNOSIS — I6310 Cerebral infarction due to embolism of unspecified precerebral artery: Secondary | ICD-10-CM | POA: Diagnosis not present

## 2018-11-15 DIAGNOSIS — I6300 Cerebral infarction due to thrombosis of unspecified precerebral artery: Secondary | ICD-10-CM | POA: Diagnosis not present

## 2018-11-15 DIAGNOSIS — I130 Hypertensive heart and chronic kidney disease with heart failure and stage 1 through stage 4 chronic kidney disease, or unspecified chronic kidney disease: Secondary | ICD-10-CM | POA: Diagnosis present

## 2018-11-15 DIAGNOSIS — Z23 Encounter for immunization: Secondary | ICD-10-CM

## 2018-11-15 DIAGNOSIS — Z20828 Contact with and (suspected) exposure to other viral communicable diseases: Secondary | ICD-10-CM | POA: Diagnosis present

## 2018-11-15 DIAGNOSIS — R739 Hyperglycemia, unspecified: Secondary | ICD-10-CM | POA: Diagnosis present

## 2018-11-15 DIAGNOSIS — R195 Other fecal abnormalities: Secondary | ICD-10-CM | POA: Diagnosis not present

## 2018-11-15 DIAGNOSIS — N179 Acute kidney failure, unspecified: Secondary | ICD-10-CM | POA: Diagnosis not present

## 2018-11-15 DIAGNOSIS — D631 Anemia in chronic kidney disease: Secondary | ICD-10-CM | POA: Diagnosis present

## 2018-11-15 DIAGNOSIS — N183 Chronic kidney disease, stage 3 (moderate): Secondary | ICD-10-CM | POA: Diagnosis present

## 2018-11-15 DIAGNOSIS — Z7902 Long term (current) use of antithrombotics/antiplatelets: Secondary | ICD-10-CM | POA: Diagnosis not present

## 2018-11-15 DIAGNOSIS — I959 Hypotension, unspecified: Secondary | ICD-10-CM | POA: Diagnosis present

## 2018-11-15 DIAGNOSIS — F0150 Vascular dementia without behavioral disturbance: Secondary | ICD-10-CM | POA: Diagnosis present

## 2018-11-15 DIAGNOSIS — R4189 Other symptoms and signs involving cognitive functions and awareness: Secondary | ICD-10-CM

## 2018-11-15 DIAGNOSIS — I10 Essential (primary) hypertension: Secondary | ICD-10-CM | POA: Diagnosis not present

## 2018-11-15 DIAGNOSIS — D492 Neoplasm of unspecified behavior of bone, soft tissue, and skin: Secondary | ICD-10-CM | POA: Diagnosis present

## 2018-11-15 DIAGNOSIS — E785 Hyperlipidemia, unspecified: Secondary | ICD-10-CM

## 2018-11-15 DIAGNOSIS — F17210 Nicotine dependence, cigarettes, uncomplicated: Secondary | ICD-10-CM | POA: Diagnosis present

## 2018-11-15 DIAGNOSIS — D62 Acute posthemorrhagic anemia: Secondary | ICD-10-CM | POA: Diagnosis present

## 2018-11-15 DIAGNOSIS — I6381 Other cerebral infarction due to occlusion or stenosis of small artery: Principal | ICD-10-CM | POA: Diagnosis present

## 2018-11-15 DIAGNOSIS — D649 Anemia, unspecified: Secondary | ICD-10-CM | POA: Diagnosis not present

## 2018-11-15 DIAGNOSIS — E7800 Pure hypercholesterolemia, unspecified: Secondary | ICD-10-CM | POA: Diagnosis present

## 2018-11-15 DIAGNOSIS — I63019 Cerebral infarction due to thrombosis of unspecified vertebral artery: Secondary | ICD-10-CM | POA: Diagnosis not present

## 2018-11-15 DIAGNOSIS — D500 Iron deficiency anemia secondary to blood loss (chronic): Secondary | ICD-10-CM | POA: Diagnosis not present

## 2018-11-15 DIAGNOSIS — I639 Cerebral infarction, unspecified: Secondary | ICD-10-CM | POA: Diagnosis present

## 2018-11-15 LAB — CBC
HCT: 26.5 % — ABNORMAL LOW (ref 39.0–52.0)
Hemoglobin: 8.3 g/dL — ABNORMAL LOW (ref 13.0–17.0)
MCH: 29.4 pg (ref 26.0–34.0)
MCHC: 31.3 g/dL (ref 30.0–36.0)
MCV: 94 fL (ref 80.0–100.0)
Platelets: 343 10*3/uL (ref 150–400)
RBC: 2.82 MIL/uL — ABNORMAL LOW (ref 4.22–5.81)
RDW: 14.7 % (ref 11.5–15.5)
WBC: 5.5 10*3/uL (ref 4.0–10.5)
nRBC: 0 % (ref 0.0–0.2)

## 2018-11-15 LAB — CBG MONITORING, ED
Glucose-Capillary: 106 mg/dL — ABNORMAL HIGH (ref 70–99)
Glucose-Capillary: 114 mg/dL — ABNORMAL HIGH (ref 70–99)

## 2018-11-15 LAB — POCT I-STAT EG7
Acid-Base Excess: 4 mmol/L — ABNORMAL HIGH (ref 0.0–2.0)
Bicarbonate: 28.3 mmol/L — ABNORMAL HIGH (ref 20.0–28.0)
Calcium, Ion: 1.24 mmol/L (ref 1.15–1.40)
HCT: 25 % — ABNORMAL LOW (ref 39.0–52.0)
Hemoglobin: 8.5 g/dL — ABNORMAL LOW (ref 13.0–17.0)
O2 Saturation: 55 %
Patient temperature: 37
Potassium: 3.9 mmol/L (ref 3.5–5.1)
Sodium: 143 mmol/L (ref 135–145)
TCO2: 30 mmol/L (ref 22–32)
pCO2, Ven: 41.4 mmHg — ABNORMAL LOW (ref 44.0–60.0)
pH, Ven: 7.443 — ABNORMAL HIGH (ref 7.250–7.430)
pO2, Ven: 28 mmHg — CL (ref 32.0–45.0)

## 2018-11-15 LAB — DIFFERENTIAL
Abs Immature Granulocytes: 0.03 10*3/uL (ref 0.00–0.07)
Basophils Absolute: 0 10*3/uL (ref 0.0–0.1)
Basophils Relative: 1 %
Eosinophils Absolute: 0.2 10*3/uL (ref 0.0–0.5)
Eosinophils Relative: 4 %
Immature Granulocytes: 1 %
Lymphocytes Relative: 13 %
Lymphs Abs: 0.7 10*3/uL (ref 0.7–4.0)
Monocytes Absolute: 0.6 10*3/uL (ref 0.1–1.0)
Monocytes Relative: 12 %
Neutro Abs: 3.8 10*3/uL (ref 1.7–7.7)
Neutrophils Relative %: 69 %

## 2018-11-15 LAB — LIPID PANEL
Cholesterol: 98 mg/dL (ref 0–200)
HDL: 52 mg/dL (ref >40)
LDL Cholesterol: 39 mg/dL (ref 0–99)
Total CHOL/HDL Ratio: 1.9 RATIO
Triglycerides: 34 mg/dL (ref <150)
VLDL: 7 mg/dL (ref 0–40)

## 2018-11-15 LAB — COMPREHENSIVE METABOLIC PANEL
ALT: 19 U/L (ref 0–44)
AST: 19 U/L (ref 15–41)
Albumin: 3.2 g/dL — ABNORMAL LOW (ref 3.5–5.0)
Alkaline Phosphatase: 44 U/L (ref 38–126)
Anion gap: 8 (ref 5–15)
BUN: 27 mg/dL — ABNORMAL HIGH (ref 8–23)
CO2: 28 mmol/L (ref 22–32)
Calcium: 9.4 mg/dL (ref 8.9–10.3)
Chloride: 104 mmol/L (ref 98–111)
Creatinine, Ser: 1.75 mg/dL — ABNORMAL HIGH (ref 0.61–1.24)
GFR calc Af Amer: 46 mL/min — ABNORMAL LOW (ref >60)
GFR calc non Af Amer: 40 mL/min — ABNORMAL LOW (ref >60)
Glucose, Bld: 122 mg/dL — ABNORMAL HIGH (ref 70–99)
Potassium: 3.7 mmol/L (ref 3.5–5.1)
Sodium: 140 mmol/L (ref 135–145)
Total Bilirubin: 0.2 mg/dL — ABNORMAL LOW (ref 0.3–1.2)
Total Protein: 6.2 g/dL — ABNORMAL LOW (ref 6.5–8.1)

## 2018-11-15 LAB — APTT: aPTT: 26 seconds (ref 24–36)

## 2018-11-15 LAB — SARS CORONAVIRUS 2 BY RT PCR (HOSPITAL ORDER, PERFORMED IN ~~LOC~~ HOSPITAL LAB): SARS Coronavirus 2: NEGATIVE

## 2018-11-15 LAB — TSH: TSH: 1.067 u[IU]/mL (ref 0.350–4.500)

## 2018-11-15 LAB — PROTIME-INR
INR: 1.1 (ref 0.8–1.2)
Prothrombin Time: 14.2 seconds (ref 11.4–15.2)

## 2018-11-15 LAB — MRSA PCR SCREENING: MRSA by PCR: NEGATIVE

## 2018-11-15 LAB — HEMOGLOBIN A1C
Hgb A1c MFr Bld: 5.2 % (ref 4.8–5.6)
Mean Plasma Glucose: 102.54 mg/dL

## 2018-11-15 MED ORDER — ACETAMINOPHEN 325 MG PO TABS: 650.0000 mg | ORAL_TABLET | ORAL | Status: DC | PRN

## 2018-11-15 MED ORDER — ATORVASTATIN CALCIUM 10 MG PO TABS
20.0000 mg | ORAL_TABLET | Freq: Every day | ORAL | Status: DC
  Administered 2018-11-15: 20 mg via ORAL
  Filled 2018-11-15: qty 2

## 2018-11-15 MED ORDER — SODIUM CHLORIDE 0.9 % IV SOLN
INTRAVENOUS | Status: AC
  Administered 2018-11-15: 13:00:00 via INTRAVENOUS

## 2018-11-15 MED ORDER — GADOBUTROL 1 MMOL/ML IV SOLN
10.0000 mL | Freq: Once | INTRAVENOUS | Status: AC | PRN
  Administered 2018-11-15: 10 mL via INTRAVENOUS

## 2018-11-15 MED ORDER — ASPIRIN 300 MG RE SUPP: 300.0000 mg | Freq: Every day | RECTAL | Status: DC

## 2018-11-15 MED ORDER — SODIUM CHLORIDE 0.9% FLUSH: 3.0000 mL | Freq: Once | INTRAVENOUS | Status: DC

## 2018-11-15 MED ORDER — DONEPEZIL HCL 10 MG PO TABS
10.0000 mg | ORAL_TABLET | ORAL | Status: DC
  Administered 2018-11-15 – 2018-11-19 (×5): 10 mg via ORAL
  Filled 2018-11-15 (×5): qty 1

## 2018-11-15 MED ORDER — CLOPIDOGREL BISULFATE 75 MG PO TABS
75.0000 mg | ORAL_TABLET | Freq: Every day | ORAL | Status: DC
  Administered 2018-11-15 – 2018-11-19 (×5): 75 mg via ORAL
  Filled 2018-11-15 (×5): qty 1

## 2018-11-15 MED ORDER — FERROUS SULFATE 325 (65 FE) MG PO TABS
325.0000 mg | ORAL_TABLET | Freq: Every day | ORAL | Status: DC
  Administered 2018-11-15 – 2018-11-19 (×5): 325 mg via ORAL
  Filled 2018-11-15 (×5): qty 1

## 2018-11-15 MED ORDER — ATORVASTATIN CALCIUM 10 MG PO TABS
10.0000 mg | ORAL_TABLET | Freq: Every day | ORAL | Status: DC
  Administered 2018-11-16 – 2018-11-18 (×3): 10 mg via ORAL
  Filled 2018-11-15 (×3): qty 1

## 2018-11-15 MED ORDER — ACETAMINOPHEN 650 MG RE SUPP: 650.0000 mg | RECTAL | Status: DC | PRN

## 2018-11-15 MED ORDER — ACETAMINOPHEN 160 MG/5ML PO SOLN: 650.0000 mg | ORAL | Status: DC | PRN

## 2018-11-15 MED ORDER — HEPARIN SODIUM (PORCINE) 5000 UNIT/ML IJ SOLN
5000.0000 [IU] | Freq: Three times a day (TID) | INTRAMUSCULAR | Status: DC
  Administered 2018-11-15 – 2018-11-17 (×8): 5000 [IU] via SUBCUTANEOUS
  Filled 2018-11-15 (×9): qty 1

## 2018-11-15 MED ORDER — STROKE: EARLY STAGES OF RECOVERY BOOK
Freq: Once | Status: AC
  Administered 2018-11-15: 13:00:00

## 2018-11-15 MED ORDER — ASPIRIN EC 81 MG PO TBEC
81.0000 mg | DELAYED_RELEASE_TABLET | Freq: Every day | ORAL | Status: DC
  Administered 2018-11-16 – 2018-11-19 (×4): 81 mg via ORAL
  Filled 2018-11-15 (×4): qty 1

## 2018-11-15 MED ORDER — ASPIRIN 325 MG PO TABS
325.0000 mg | ORAL_TABLET | Freq: Every day | ORAL | Status: DC
  Administered 2018-11-15: 13:00:00 325 mg via ORAL
  Filled 2018-11-15: qty 1

## 2018-11-15 MED ORDER — LACTATED RINGERS IV BOLUS
500.0000 mL | Freq: Once | INTRAVENOUS | Status: AC
  Administered 2018-11-15: 500 mL via INTRAVENOUS

## 2018-11-15 NOTE — ED Notes (Signed)
Patient transported to MRI 

## 2018-11-15 NOTE — ED Notes (Signed)
ED TO INPATIENT HANDOFF REPORT  ED Nurse Name and Phone #: Suezanne Jacquet [phone]  S Name/Age/Gender Karl Miller 63 y.o. male Room/Bed: 033C/033C  Code Status   Code Status: Prior  Home/SNF/Other Nursing Home Patient oriented to: self and place Is this baseline? Yes   Triage Complete: Triage complete  Chief Complaint code stroke  Triage Note BIB [facility] from [facility] with c/o of left sided weakness, facial droop, and sensory deficits. Pt walking in hall alone when he slumped over to left side. Pt also  found diaphoretic, clammy. CBG reported in 200s from facility. 106 CBG on arrival.    Allergies No Known Allergies  Level of Care/Admitting Diagnosis ED Disposition    ED Disposition Condition Comment   Admit  The patient appears reasonably stabilized for admission considering the current resources, flow, and capabilities available in the ED at this time, and I doubt any other [facility] requiring further screening and/or treatment in the ED prior to admission is  present.       B Medical/Surgery History Past Medical History:  Diagnosis Date  . Acute encephalopathy 12/03/2017   Karl Miller 12/04/2017  . High cholesterol   . Hypertension   . Stroke ([place])   . TIA (transient ischemic attack) 12/03/2017   Karl Miller 12/04/2017   Past Surgical History:  Procedure Laterality Date  . LOOP RECORDER INSERTION N/A 12/07/2017   Procedure: LOOP RECORDER INSERTION;  Surgeon: Constance Haw, MD;  Location: [place] [facility];  Service: Cardiovascular;  Laterality: N/A;  . NO PAST SURGERIES    . TEE WITHOUT CARDIOVERSION N/A 12/07/2017   Procedure: TRANSESOPHAGEAL ECHOCARDIOGRAM (TEE);  Surgeon: Dorothy Spark, MD;  Location: [facility] ENDOSCOPY;  Service: Cardiovascular;  Laterality: N/A;     A IV Location/Drains/Wounds Patient Lines/Drains/Airways Status   Active Line/Drains/Airways    Name:   Placement date:   Placement time:   Site:   Days:   Peripheral IV 11/15/18 Left Antecubital    11/15/18    0257    Antecubital   less than 1          Intake/Output Last 24 hours No intake or output data in the 24 hours ending 11/15/18 0618  Labs/Imaging Results for orders placed or performed during the hospital encounter of 11/15/18 (from the past 48 hour(s))  CBG monitoring, ED     Status: Abnormal   Collection Time: 11/15/18  2:52 AM  Result Value Ref Range   Glucose-Capillary 106 (H) 70 - 99 mg/dL  Protime-INR     Status: None   Collection Time: 11/15/18  2:56 AM  Result Value Ref Range   Prothrombin Time 14.2 11.4 - 15.2 seconds   INR 1.1 0.8 - 1.2    Comment: (NOTE) INR goal varies based on device and disease states. Performed at [facility], [place] [address] [place], [place] [address]   APTT     Status: None   Collection Time: 11/15/18  2:56 AM  Result Value Ref Range   aPTT 26 24 - 36 seconds    Comment: Performed at [place] [address], [place] [address]  CBC     Status: Abnormal   Collection Time: 11/15/18  2:56 AM  Result Value Ref Range   WBC 5.5 4.0 - 10.5 K/uL   RBC 2.82 (L) 4.22 - 5.81 MIL/uL   Hemoglobin 8.3 (L) 13.0 - 17.0 g/dL   HCT 26.5 (L) 39.0 - 52.0 %   MCV 94.0 80.0 -  100.0 fL   MCH 29.4 26.0 - 34.0 pg   MCHC 31.3 30.0 - 36.0 g/dL   RDW 14.7 11.5 - 15.5 %   Platelets 343 150 - 400 K/uL   nRBC 0.0 0.0 - 0.2 %    Comment: Performed at [facility], [place] [address], [place], [place] [address]  Differential     Status: None   Collection Time: 11/15/18  2:56 AM  Result Value Ref Range   Neutrophils Relative % 69 %   Neutro Abs 3.8 1.7 - 7.7 K/uL   Lymphocytes Relative 13 %   Lymphs Abs 0.7 0.7 - 4.0 K/uL   Monocytes Relative 12 %   Monocytes Absolute 0.6 0.1 - 1.0 K/uL   Eosinophils Relative 4 %   Eosinophils Absolute 0.2 0.0 - 0.5 K/uL   Basophils Relative 1 %   Basophils Absolute 0.0 0.0 - 0.1 K/uL   Immature Granulocytes 1 %   Abs Immature Granulocytes 0.03 0.00 - 0.07 K/uL    Comment: Performed  at [facility], [place] [address], [place], [place] [address]  Comprehensive metabolic panel     Status: Abnormal   Collection Time: 11/15/18  2:56 AM  Result Value Ref Range   Sodium 140 135 - 145 mmol/L   Potassium 3.7 3.5 - 5.1 mmol/L   Chloride 104 98 - 111 mmol/L   CO2 28 22 - 32 mmol/L   Glucose, Bld 122 (H) 70 - 99 mg/dL   BUN 27 (H) 8 - 23 mg/dL   Creatinine, Ser 1.75 (H) 0.61 - 1.24 mg/dL   Calcium 9.4 8.9 - 10.3 mg/dL   Total Protein 6.2 (L) 6.5 - 8.1 g/dL   Albumin 3.2 (L) 3.5 - 5.0 g/dL   AST 19 15 - 41 U/L   ALT 19 0 - 44 U/L   Alkaline Phosphatase 44 38 - 126 U/L   Total Bilirubin 0.2 (L) 0.3 - 1.2 mg/dL   GFR calc non Af Amer 40 (L) >60 mL/min   GFR calc Af Amer 46 (L) >60 mL/min   Anion gap 8 5 - 15    Comment: Performed at [facility], [place] [address] [place], [place] [address]  CBG monitoring, ED     Status: Abnormal   Collection Time: 11/15/18  2:56 AM  Result Value Ref Range   Glucose-Capillary 114 (H) 70 - 99 mg/dL  POCT I-Stat EG7     Status: Abnormal   Collection Time: 11/15/18  3:02 AM  Result Value Ref Range   pH, Ven 7.443 (H) 7.250 - 7.430   pCO2, Ven 41.4 (L) 44.0 - 60.0 mmHg   pO2, Ven 28.0 (LL) 32.0 - 45.0 mmHg   Bicarbonate 28.3 (H) 20.0 - 28.0 mmol/L   TCO2 30 22 - 32 mmol/L   O2 Saturation 55.0 %   Acid-Base Excess 4.0 (H) 0.0 - 2.0 mmol/L   Sodium 143 135 - 145 mmol/L   Potassium 3.9 3.5 - 5.1 mmol/L   Calcium, Ion 1.24 1.15 - 1.40 mmol/L   HCT 25.0 (L) 39.0 - 52.0 %   Hemoglobin 8.5 (L) 13.0 - 17.0 g/dL   Patient temperature 37.0 C    Sample type VENOUS    Comment NOTIFIED PHYSICIAN    Mr Karl Miller Wo Contrast  Result Date: 11/15/2018 CLINICAL DATA:  Muscle weakness, generalized.  Fall. EXAM: MRI HEAD WITHOUT CONTRAST MRA HEAD WITHOUT CONTRAST TECHNIQUE: Multiplanar, multiecho pulse sequences of the brain and surrounding structures  were obtained without intravenous contrast. Angiographic images of the head were obtained using  MRA technique without contrast. COMPARISON:  CT head without contrast 11/15/2018. MRI brain 12/04/2017 FINDINGS: MRI HEAD FINDINGS Brain: Acute nonhemorrhagic infarct involves the right centrum semi ovale. Patient has multiple bilateral remote lacunar infarcts in a similar location. Remote lacunar infarcts also involves the basal ganglia bilaterally. There is no hemorrhage. Remote lacunar infarcts of the thalami bilaterally are stable. A remote lacunar infarct is again noted in the pons. A remote left PICA territory infarct is stable. Moderate diffuse white matter disease is otherwise stable bilaterally. Advanced atrophy is noted. Vascular: Flow is present in the major intracranial arteries. Skull and upper cervical spine: Craniocervical junction is normal. There is decreased marrow signal in the upper cervical spine. This is new since the prior study. A posterior spinal mass is present at C4-5. This appears to have mass effect on the posterior aspect of the spinal cord. No other focal lesions are present. The Sinuses/Orbits: Mild mucosal thickening is present inferiorly in the left maxillary sinus. The paranasal sinuses and mastoid air cells are otherwise clear. The globes and orbits are within normal limits. MRA HEAD FINDINGS The internal carotid arteries are within normal limits from the high cervical segments through the ICA terminus bilaterally. The A1 and M1 segments are normal. No definite anterior communicating artery is present. MCA bifurcations are within normal limits. The ACA and MCA branch vessels are within normal limits. There is a moderate focal stenosis of the dominant left vertebral artery at the dural margin. The left PICA is just above this level. Right PICA is within normal limits. Vertebrobasilar junction is normal. Both posterior cerebral arteries originate from basilar tip. There is some attenuation of distal PCA branch vessels. IMPRESSION: 1. Acute nonhemorrhagic lacunar infarct involving  the right basal ganglia and centrum semi ovale. 2. Multiple remote nonhemorrhagic lacunar infarcts involving the basal ganglia and centrum semi ovale bilaterally. 3. Remote lacunar infarcts of the thalami and brainstem. 4. Remote left PICA territory infarct. 5. Advanced atrophy and white matter disease, consistent with chronic microvascular ischemia. 6. 10 mm posterior spinal mass at the C4-5 level may create mass effect on the cervical spinal cord. Recommend MRI of the cervical spine without and with contrast. The patient can receive half dose contrast due to renal insufficiency. 7. Decreased marrow signal in the upper cervical spine. This is likely related to kidney disease. 8. Moderate stenosis of the proximal left vertebral artery does not appear to be flow limiting. 9. Mild distal small vessel disease without other significant proximal stenosis, aneurysm, or branch vessel occlusion within the circle-of-Willis. Electronically Signed   By: San Morelle M.D.   On: 11/15/2018 05:43   Mr Brain Wo Contrast  Result Date: 11/15/2018 CLINICAL DATA:  Muscle weakness, generalized.  Fall. EXAM: MRI HEAD WITHOUT CONTRAST MRA HEAD WITHOUT CONTRAST TECHNIQUE: Multiplanar, multiecho pulse sequences of the brain and surrounding structures were obtained without intravenous contrast. Angiographic images of the head were obtained using MRA technique without contrast. COMPARISON:  CT head without contrast 11/15/2018. MRI brain 12/04/2017 FINDINGS: MRI HEAD FINDINGS Brain: Acute nonhemorrhagic infarct involves the right centrum semi ovale. Patient has multiple bilateral remote lacunar infarcts in a similar location. Remote lacunar infarcts also involves the basal ganglia bilaterally. There is no hemorrhage. Remote lacunar infarcts of the thalami bilaterally are stable. A remote lacunar infarct is again noted in the pons. A remote left PICA territory infarct is stable. Moderate diffuse white matter disease  is otherwise  stable bilaterally. Advanced atrophy is noted. Vascular: Flow is present in the major intracranial arteries. Skull and upper cervical spine: Craniocervical junction is normal. There is decreased marrow signal in the upper cervical spine. This is new since the prior study. A posterior spinal mass is present at C4-5. This appears to have mass effect on the posterior aspect of the spinal cord. No other focal lesions are present. The Sinuses/Orbits: Mild mucosal thickening is present inferiorly in the left maxillary sinus. The paranasal sinuses and mastoid air cells are otherwise clear. The globes and orbits are within normal limits. MRA HEAD FINDINGS The internal carotid arteries are within normal limits from the high cervical segments through the ICA terminus bilaterally. The A1 and M1 segments are normal. No definite anterior communicating artery is present. MCA bifurcations are within normal limits. The ACA and MCA branch vessels are within normal limits. There is a moderate focal stenosis of the dominant left vertebral artery at the dural margin. The left PICA is just above this level. Right PICA is within normal limits. Vertebrobasilar junction is normal. Both posterior cerebral arteries originate from basilar tip. There is some attenuation of distal PCA branch vessels. IMPRESSION: 1. Acute nonhemorrhagic lacunar infarct involving the right basal ganglia and centrum semi ovale. 2. Multiple remote nonhemorrhagic lacunar infarcts involving the basal ganglia and centrum semi ovale bilaterally. 3. Remote lacunar infarcts of the thalami and brainstem. 4. Remote left PICA territory infarct. 5. Advanced atrophy and white matter disease, consistent with chronic microvascular ischemia. 6. 10 mm posterior spinal mass at the C4-5 level may create mass effect on the cervical spinal cord. Recommend MRI of the cervical spine without and with contrast. The patient can receive half dose contrast due to renal insufficiency. 7.  Decreased marrow signal in the upper cervical spine. This is likely related to kidney disease. 8. Moderate stenosis of the proximal left vertebral artery does not appear to be flow limiting. 9. Mild distal small vessel disease without other significant proximal stenosis, aneurysm, or branch vessel occlusion within the circle-of-Willis. Electronically Signed   By: San Morelle M.D.   On: 11/15/2018 05:43   Ct Head Code Stroke Wo Contrast  Result Date: 11/15/2018 CLINICAL DATA:  Code stroke. 63 year old male with left facial droop. EXAM: CT HEAD WITHOUT CONTRAST TECHNIQUE: Contiguous axial images were obtained from the base of the skull through the vertex without intravenous contrast. COMPARISON:  Brain MRI, CTA head and neck 12/04/2017. FINDINGS: Brain: Chronic infarcts with encephalomalacia in the left cerebellum, right occipital lobe. Widespread Patchy and confluent cerebral white matter and bilateral deep gray matter hypodensity. Stable gray-white matter differentiation throughout the brain since 2019. No midline shift, ventriculomegaly, mass effect, evidence of mass lesion, intracranial hemorrhage or evidence of cortically based acute infarction. Vascular: Calcified atherosclerosis at the skull base. No suspicious intracranial vascular hyperdensity. Skull: No acute osseous abnormality identified. Sinuses/Orbits: Visualized paranasal sinuses and mastoids are stable and well pneumatized. Other: No acute orbit or scalp soft tissue findings. ASPECTS [facility] Stroke Program Early CT Score) - Ganglionic level infarction (caudate, lentiform nuclei, internal capsule, insula, M1-M3 cortex): 7 - Supraganglionic infarction (M4-M6 cortex): 3 Total score (0-10 with 10 being normal): 10 (chronic encephalomalacia) IMPRESSION: 1. Extensive chronic ischemic disease appears stable by CT since 2019. 2. No acute intracranial hemorrhage or cortically based infarct identified. 3. ASPECTS is 10. 4. These results were  communicated to Dr. Cheral Marker at 3:14 am on 11/15/2018 by text page via the [facility] messaging system. Electronically  Signed   By: Genevie Ann M.D.   On: 11/15/2018 03:14    Pending Labs Unresulted Labs (From admission, onward)   None      Vitals/Pain Today's Vitals   11/15/18 0356 11/15/18 0400 11/15/18 0415 11/15/18 0430  BP: 130/80 124/87 124/82 108/73  Pulse: 78 63 62 60  Resp: 17 18 15 15   Temp:      TempSrc:      SpO2: 100% 100% 100% 100%  Weight:      Height:      PainSc:        Isolation Precautions No active isolations  Medications Medications  sodium chloride flush (NS) 0.9 % injection 3 mL (3 mLs Intravenous Not Given 11/15/18 0326)  lactated ringers bolus 500 mL (0 mLs Intravenous Stopped 11/15/18 0529)    Mobility walks with person assist High fall risk   Focused Assessments Neuro Assessment Handoff:  Swallow screen pass? Yes    NIH Stroke Scale ( + Modified Stroke Scale Criteria)  Interval: Initial Level of Consciousness (1a.)   : Alert, keenly responsive LOC Questions (1b. )   +: Answers neither question correctly LOC Commands (1c. )   + : Performs both tasks correctly Best Gaze (2. )  +: Normal Visual (3. )  +: No visual loss Facial Palsy (4. )    : Minor paralysis Motor Arm, Left (5a. )   +: No drift Motor Arm, Right (5b. )   +: No drift Motor Leg, Left (6a. )   +: No drift Motor Leg, Right (6b. )   +: No drift Limb Ataxia (7. ): Absent Sensory (8. )   +: Normal, no sensory loss Best Language (9. )   +: No aphasia Dysarthria (10. ): Normal Extinction/Inattention (11.)   +: No Abnormality Modified SS Total  +: 2 Complete NIHSS TOTAL: 3 Last date known well: 11/15/18 Last time known well: 0130 Neuro Assessment: Exceptions to [facility] Neuro Checks:   Initial (11/15/18 0315)  Last Documented NIHSS Modified Score: 2 (11/15/18 0515) Has TPA been given? No If patient is a Neuro Trauma and patient is going to OR before floor call report to [place] nurse:  [phone] or [phone]     R Recommendations: See Admitting Provider Note  Report given to:   Additional Notes: Hx of dementia.

## 2018-11-15 NOTE — Consult Note (Signed)
NEURO HOSPITALIST CONSULT NOTE   Requestig physician: Dr. Dayna Barker  Reason for Consult: Code Stroke  History obtained from:  EMS, [place] Staff and Chart     HPI:                                                                                                                                          Karl Miller is an 63 y.o. male who is a resident of a dementia unit at assisted living who was walking at about 1:30 AM when staff noted him to be leaning to the left, then saw him stumblling backwards without falling. Staff sat him down and he was cool, clammy and diaphoretic with BP of 80/60 sitting, HR was 103. CBG was elevated at 319. EMS was called for the hypotension, hyperglycemia and the unsteady gait.    Past Medical History:  Diagnosis Date  . Acute encephalopathy 12/03/2017   Karl Miller 12/04/2017  . High cholesterol   . Hypertension   . Stroke ([place])   . TIA (transient ischemic attack) 12/03/2017   Karl Miller 12/04/2017    Past Surgical History:  Procedure Laterality Date  . LOOP RECORDER INSERTION N/A 12/07/2017   Procedure: LOOP RECORDER INSERTION;  Surgeon: Constance Haw, MD;  Location: [place] [facility];  Service: Cardiovascular;  Laterality: N/A;  . NO PAST SURGERIES    . TEE WITHOUT CARDIOVERSION N/A 12/07/2017   Procedure: TRANSESOPHAGEAL ECHOCARDIOGRAM (TEE);  Surgeon: Dorothy Spark, MD;  Location: [facility] ENDOSCOPY;  Service: Cardiovascular;  Laterality: N/A;    No family history on file.            Social History:  reports that he has been smoking cigarettes. He has a 12.00 pack-year smoking history. He has never used smokeless tobacco. He reports that he does not drink alcohol or use drugs.  No Known Allergies  HOME MEDICATIONS:                                                                                                                        ROS:  The patient does not respond to most questions. Unable to perform ROS.    There were no vitals taken for this visit.   General Examination:                                                                                                       Physical Exam  HEENT-  [place]/AT   Lungs- Respirations unlabored Extremities- Warm and well perfused  Neurological Examination Mental Status: Awake and alert. Abulic with poverty of thought. Mostly one-two word answers to questions. Follows simple commands. Orientation is intact to city and state but not to time.  Cranial Nerves: II: Visual fields grossly normal. PERRL.   III,IV, VI: No ptosis. EOMI.  VII: Mild left facial droop.  VIII: hearing intact to voice IX,X: Mild hypophonia XI: Head is midline XII: midline tongue extension Motor: RUE 4+/5 LUE 4+/5 RLE with poor effort, maximum strength elicitable is 4-/5 LLE with poor effort, maximum strength elicitable is 4+/5 Sensory: Responds to light touch in all 4 limbs.  Deep Tendon Reflexes: Unremarkable Cerebellar: No ataxia noted with FNF bilaterally  Gait: Deferred    Lab Results: Basic Metabolic Panel: Recent Labs  Lab 11/15/18 0302  NA 143  K 3.9    CBC: Recent Labs  Lab 11/15/18 0302  HGB 8.5*  HCT 25.0*    Cardiac Enzymes: No results for input(s): CKTOTAL, CKMB, CKMBINDEX, TROPONINI in the last 168 hours.  Lipid Panel: No results for input(s): CHOL, TRIG, HDL, CHOLHDL, VLDL, LDLCALC in the last 168 hours.  Imaging: CT head:  1. Extensive chronic ischemic disease appears stable by CT since 2019. 2. No acute intracranial hemorrhage or cortically based infarct identified. 3. ASPECTS is 10.  Assessment: 63 year old male presenting after a spell of presyncope at assisted living facility with wobbly gait, diaphoresis and hypotension.  1. Neurological exam findings with mild left facial droop and BLE weakness  with possible right greater than left lower extremity deficit. Unable to determine if these are old or new deficits on exam given poor effort by the patient, patient unable to relate his PMHx due to dementia and discussion over the telephone with staff at SNF not specific with regards to possible old deficits. Facial droop is relatively minor and RLE weakness more likely from lumbar radiculopathy or pain/deconditioning than a stroke.  2. Risks of hemorrhagic complications from tPA significantly outweigh potential benefits given that hypotension is the most likely precipitant of his presentation, as well as patchy exam abnormalities and poor effort by patient, confounding a definitive localization based on the exam   3. Will need MRI brain to further evaluate.    Recommendations: 1. MRI and MRA of brain. Will need further work up if MRI brain is positive for acute or subacute stroke.  2. Continue home atorvastatin and Plavix 3. Orthostatics. 4. Fluid bolus followed by IV infusion. Continue until hypotension resolves.    Electronically signed: Dr. Kerney Elbe 11/15/2018, 3:06 AM

## 2018-11-15 NOTE — Progress Notes (Signed)
STROKE TEAM PROGRESS NOTE   INTERVAL HISTORY Patient is sitting up in bed.  He is awake alert and cooperative.  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  He does have baseline dementia and I agree with Dr. Arnoldo Morale from neurosurgery that he is not a candidate for spinal surgery at the present time.  Vitals:   11/15/18 1246 11/15/18 1415 11/15/18 1423 11/15/18 1619  BP: (!) 141/81 (!) 141/93  138/85  Pulse: 65 75  67  Resp: 18 16  16   Temp: (!) 97.5 F (36.4 C)  98.2 F (36.8 C)   TempSrc: Oral  Oral   SpO2: 90% 100%  98%  Weight:      Height:        CBC:  Recent Labs  Lab 11/15/18 0256 11/15/18 0302  WBC 5.5  --   NEUTROABS 3.8  --   HGB 8.3* 8.5*  HCT 26.5* 25.0*  MCV 94.0  --   PLT 343  --     Basic Metabolic Panel:  Recent Labs  Lab 11/15/18 0256 11/15/18 0302  NA 140 143  K 3.7 3.9  CL 104  --   CO2 28  --   GLUCOSE 122*  --   BUN 27*  --   CREATININE 1.75*  --   CALCIUM 9.4  --    Lipid Panel:     Component Value Date/Time   CHOL 98 11/15/2018 0256   TRIG 34 11/15/2018 0256   HDL 52 11/15/2018 0256   CHOLHDL 1.9 11/15/2018 0256   VLDL 7 11/15/2018 0256   LDLCALC 39 11/15/2018 0256   HgbA1c:  Lab Results  Component Value Date   HGBA1C 5.2 11/15/2018   Urine Drug Screen:     Component Value Date/Time   LABOPIA NONE DETECTED 12/04/2017 1102   COCAINSCRNUR NONE DETECTED 12/04/2017 1102   LABBENZ NONE DETECTED 12/04/2017 1102   AMPHETMU NONE DETECTED 12/04/2017 1102   THCU NONE DETECTED 12/04/2017 1102   LABBARB NONE DETECTED 12/04/2017 1102    Alcohol Level     Component Value Date/Time   [facility] <10 12/04/2017 0854    IMAGING [name] Head Wo Contrast  Result Date: 11/15/2018 CLINICAL DATA:  Muscle weakness, generalized.  Fall. EXAM: MRI HEAD WITHOUT CONTRAST MRA HEAD WITHOUT CONTRAST TECHNIQUE: Multiplanar, multiecho pulse  sequences of the brain and surrounding structures were obtained without intravenous contrast. Angiographic images of the head were obtained using MRA technique without contrast. COMPARISON:  CT head without contrast 11/15/2018. MRI brain 12/04/2017 FINDINGS: MRI HEAD FINDINGS Brain: Acute nonhemorrhagic infarct involves the right centrum semi ovale. Patient has multiple bilateral remote lacunar infarcts in a similar location. Remote lacunar infarcts also involves the basal ganglia bilaterally. There is no hemorrhage. Remote lacunar infarcts of the thalami bilaterally are stable. A remote lacunar infarct is again noted in the pons. A remote left PICA territory infarct is stable. Moderate diffuse white matter disease is otherwise stable bilaterally. Advanced atrophy is noted. Vascular: Flow is present in the major intracranial arteries. Skull and upper cervical spine: Craniocervical junction is normal. There is decreased marrow signal in the upper cervical spine. This is new since the prior study. A posterior spinal mass is present at C4-5. This appears to have mass effect on the posterior aspect of the spinal cord. No other focal lesions are present. The Sinuses/Orbits: Mild mucosal thickening is present inferiorly in  the left maxillary sinus. The paranasal sinuses and mastoid air cells are otherwise clear. The globes and orbits are within normal limits. MRA HEAD FINDINGS The internal carotid arteries are within normal limits from the high cervical segments through the ICA terminus bilaterally. The A1 and M1 segments are normal. No definite anterior communicating artery is present. MCA bifurcations are within normal limits. The ACA and MCA branch vessels are within normal limits. There is a moderate focal stenosis of the dominant left vertebral artery at the dural margin. The left PICA is just above this level. Right PICA is within normal limits. Vertebrobasilar junction is normal. Both posterior cerebral arteries  originate from basilar tip. There is some attenuation of distal PCA branch vessels. IMPRESSION: 1. Acute nonhemorrhagic lacunar infarct involving the right basal ganglia and centrum semi ovale. 2. Multiple remote nonhemorrhagic lacunar infarcts involving the basal ganglia and centrum semi ovale bilaterally. 3. Remote lacunar infarcts of the thalami and brainstem. 4. Remote left PICA territory infarct. 5. Advanced atrophy and white matter disease, consistent with chronic microvascular ischemia. 6. 10 mm posterior spinal mass at the C4-5 level may create mass effect on the cervical spinal cord. Recommend MRI of the cervical spine without and with contrast. The patient can receive half dose contrast due to renal insufficiency. 7. Decreased marrow signal in the upper cervical spine. This is likely related to kidney disease. 8. Moderate stenosis of the proximal left vertebral artery does not appear to be flow limiting. 9. Mild distal small vessel disease without other significant proximal stenosis, aneurysm, or branch vessel occlusion within the circle-of-Willis. Electronically Signed   By: San Morelle M.D.   On: 11/15/2018 05:43   Mr Brain Wo Contrast  Result Date: 11/15/2018 CLINICAL DATA:  Muscle weakness, generalized.  Fall. EXAM: MRI HEAD WITHOUT CONTRAST MRA HEAD WITHOUT CONTRAST TECHNIQUE: Multiplanar, multiecho pulse sequences of the brain and surrounding structures were obtained without intravenous contrast. Angiographic images of the head were obtained using MRA technique without contrast. COMPARISON:  CT head without contrast 11/15/2018. MRI brain 12/04/2017 FINDINGS: MRI HEAD FINDINGS Brain: Acute nonhemorrhagic infarct involves the right centrum semi ovale. Patient has multiple bilateral remote lacunar infarcts in a similar location. Remote lacunar infarcts also involves the basal ganglia bilaterally. There is no hemorrhage. Remote lacunar infarcts of the thalami bilaterally are stable. A remote  lacunar infarct is again noted in the pons. A remote left PICA territory infarct is stable. Moderate diffuse white matter disease is otherwise stable bilaterally. Advanced atrophy is noted. Vascular: Flow is present in the major intracranial arteries. Skull and upper cervical spine: Craniocervical junction is normal. There is decreased marrow signal in the upper cervical spine. This is new since the prior study. A posterior spinal mass is present at C4-5. This appears to have mass effect on the posterior aspect of the spinal cord. No other focal lesions are present. The Sinuses/Orbits: Mild mucosal thickening is present inferiorly in the left maxillary sinus. The paranasal sinuses and mastoid air cells are otherwise clear. The globes and orbits are within normal limits. MRA HEAD FINDINGS The internal carotid arteries are within normal limits from the high cervical segments through the ICA terminus bilaterally. The A1 and M1 segments are normal. No definite anterior communicating artery is present. MCA bifurcations are within normal limits. The ACA and MCA branch vessels are within normal limits. There is a moderate focal stenosis of the dominant left vertebral artery at the dural margin. The left PICA is just above this level.  Right PICA is within normal limits. Vertebrobasilar junction is normal. Both posterior cerebral arteries originate from basilar tip. There is some attenuation of distal PCA branch vessels. IMPRESSION: 1. Acute nonhemorrhagic lacunar infarct involving the right basal ganglia and centrum semi ovale. 2. Multiple remote nonhemorrhagic lacunar infarcts involving the basal ganglia and centrum semi ovale bilaterally. 3. Remote lacunar infarcts of the thalami and brainstem. 4. Remote left PICA territory infarct. 5. Advanced atrophy and white matter disease, consistent with chronic microvascular ischemia. 6. 10 mm posterior spinal mass at the C4-5 level may create mass effect on the cervical spinal  cord. Recommend MRI of the cervical spine without and with contrast. The patient can receive half dose contrast due to renal insufficiency. 7. Decreased marrow signal in the upper cervical spine. This is likely related to kidney disease. 8. Moderate stenosis of the proximal left vertebral artery does not appear to be flow limiting. 9. Mild distal small vessel disease without other significant proximal stenosis, aneurysm, or branch vessel occlusion within the circle-of-Willis. Electronically Signed   By: San Morelle M.D.   On: 11/15/2018 05:43   Mr Cervical Spine W Wo Contrast  Result Date: 11/15/2018 CLINICAL DATA:  Follow-up to abnormality seen on brain MRI. Generalized muscle weakness with fall. EXAM: MRI CERVICAL SPINE WITHOUT AND WITH CONTRAST TECHNIQUE: Multiplanar and multiecho pulse sequences of the cervical spine, to include the craniocervical junction and cervicothoracic junction, were obtained without and with intravenous contrast. CONTRAST:  Gadavist 10 mL. COMPARISON:  MR brain earlier in the day.  CTA head neck 12/04/2017. FINDINGS: Significant motion degradation. The study is of borderline diagnostic utility. Alignment: Physiologic. Vertebrae: No fracture, evidence of discitis, or bone lesion. 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 The lesion measures roughly 8 x 8 x 12 mm. The mass demonstrates intermediate T1, decreased T2, and increased STIR intensity. There is avid homogeneous postcontrast enhancement. Abnormal cord signal is difficult to confirm or exclude. There is significant spinal stenosis and displacement of the spinal cord RIGHT-to-LEFT. Differential considerations include meningioma versus schwannoma. Posterior Fossa, vertebral arteries, paraspinal tissues: Posterior fossa unremarkable. Vertebral flow voids are maintained. No definite neck masses. Disc levels: Assessment of individual levels or disc  pathology is limited. Disc space narrowing is most pronounced at the C6-7 level, where there appears to be a shallow protrusion. Compared with the previous CT study from May 2019, the mass was present but difficult to see. 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 Differential considerations include meningioma versus schwannoma. Neurosurgical consultation is warranted. Motion degradation makes assessment of cord edema problematic. Multilevel spondylosis appears most prominent at C6-C7. Electronically Signed   By: Staci Righter M.D.   On: 11/15/2018 12:56   Ct Head Code Stroke Wo Contrast  Result Date: 11/15/2018 CLINICAL DATA:  Code stroke. 63 year old male with left facial droop. EXAM: CT HEAD WITHOUT CONTRAST TECHNIQUE: Contiguous axial images were obtained from the base of the skull through the vertex without intravenous contrast. COMPARISON:  Brain MRI, CTA head and neck 12/04/2017. FINDINGS: Brain: Chronic infarcts with encephalomalacia in the left cerebellum, right occipital lobe. Widespread Patchy and confluent cerebral white matter and bilateral deep gray matter hypodensity. Stable gray-white matter differentiation throughout the brain since 2019. No midline shift, ventriculomegaly, mass effect, evidence of mass lesion, intracranial hemorrhage or evidence of cortically based acute infarction.  Vascular: Calcified atherosclerosis at the skull base. No suspicious intracranial vascular hyperdensity. Skull: No acute osseous abnormality identified. Sinuses/Orbits: Visualized paranasal sinuses and mastoids are stable and well pneumatized. Other: No acute orbit or scalp soft tissue findings. ASPECTS [facility] Stroke Program Early CT Score) - Ganglionic level infarction (caudate, lentiform nuclei, internal capsule, insula, M1-M3 cortex): 7 - Supraganglionic infarction  (M4-M6 cortex): 3 Total score (0-10 with 10 being normal): 10 (chronic encephalomalacia) IMPRESSION: 1. Extensive chronic ischemic disease appears stable by CT since 2019. 2. No acute intracranial hemorrhage or cortically based infarct identified. 3. ASPECTS is 10. 4. These results were communicated to Dr. Cheral Marker at 3:14 am on 11/15/2018 by text page via the [facility] messaging system. Electronically Signed   By: Genevie Ann M.D.   On: 11/15/2018 03:14    PHYSICAL EXAM Pleasant middle-age male currently not in distress. . Afebrile. Head is nontraumatic. Neck is supple without bruit.    Cardiac exam no murmur or gallop. Lungs are clear to auscultation. Distal pulses are well felt. Neurological Exam ;  Awake alert oriented to person only.  Diminished attention, registration and recall.  Speech is slightly hypophonic and nonfluent but speaks mostly a few words and short sentences only.  Follows only simple midline commands.  Extraocular movements are full range without nystagmus.  Blinks to threat bilaterally.  Mild left lower facial weakness.  Tongue midline.  Motor system exam shows mild weakness of left grip and intrinsic hand muscles.  Orbits right over left upper extremity.  Mild left greater than right lower extremity proximal and distal weakness but effort is variable and poor.  Mild subjective left upper and lower extremity diminished sensation.  Coordination is slow but accurate.  Gait not tested. ASSESSMENT/PLAN Karl Miller is a 63 y.o. male with history of dementia presenting from ALF with hypotension, hyperglycemia and unsteady gait. Found to have a L facial droop and lip weakness   Stroke:  right lacunar  infarct secondary to either large vessel disease from ICA or a large lacune from small vessel disease source  Code Stroke CT head No acute stroke. Extensive small vessel disease. ASPECTS 10.     MRI - MRA head - MRA neck R BG and CSV infarct. Mult old B BG and CSV, thalami and brainstem  lacunes. Old L PICA infarct. Advanced small vessel disease. Atrophy. 78mm posterior spinal mass w/ mass effection on spinal cord. Decreased marrow signal upper cervical spine. Mod stenosis prox L VA. Mild distal SVD.  2D Echo pending   LDL 39  HgbA1c 5.2  Heparin 5000 units sq tid for VTE prophylaxis  clopidogrel 75 mg daily prior to admission, now on aspirin 325 mg daily and clopidogrel 75 mg daily. Continue DAPT x 3 weeks then plavix alone. Decrease aspirin to 81.   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  Likely not a surgical candidate  No need for urgent removal  Follow up Dr. Arnoldo Morale as an OP  Hypertension  Stable . Permissive hypertension (OK if < 220/120) but gradually normalize in 5-7 days . Long-term BP goal normotensive  Hyperlipidemia  Home meds:  lipitor 20, resumed in hospital  LDL 39, goal < 70  Given low LDL, decrease lipitor dose to 10  Continue statin at discharge  Other Stroke Risk Factors  Advanced age  Cigarette smoker, advised to stop smoking  Hx  stroke/TIA  11/2017 - Strokes: left PCA and left MCA/ACA scattered infarcts, etiology unclear. TEE leg. Loop placed (no AF noted, last checked 11/02/2018).  Other Active Problems  Baseline dementia  Hospital day # 0  I have personally obtained history,examined this patient, reviewed notes, independently viewed imaging studies, participated in medical decision making and plan of care.ROS completed by me personally and pertinent positives fully documented  I have made any additions or clarifications directly to the above note.  63 year old male with prior history of cryptogenic stroke and mild dementia presented with imbalance and mild left-sided weakness secondary to right basal ganglia infarct etiology indeterminate small vessel disease versus  cryptogenic.  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  Agree with neurosurgery that patient is not a candidate for neurosurgery at the present time.  Recommend dual antiplatelet therapy for 3 weeks followed by Plavix alone.  Physical occupational therapy consult.  Discussed with family practice medical resident on call.  Greater than 50% time during this 35-minute visit was spent on counseling and coordination of care about his stroke and cervical cord tumor and answering questions  Antony Contras, [place] Pager: [phone] 11/15/2018 5:15 PM   To contact Stroke Continuity provider, please refer to [email or web address] After hours, contact General Neurology

## 2018-11-15 NOTE — Progress Notes (Signed)
Spoke with brother Nechemia Chiappetta.  Informing that the patient wished to be full code including resuscitation and intubation with ventilation if needed.  Brother informed that patient's baseline is that he has mild dementia and lives in a memory care unit.  Patient is not able to perform ADLs such as drive or pays bills.  Patient can do IADLs including bathe himself and dress himself and feed himself.

## 2018-11-15 NOTE — H&P (Addendum)
[facility] Admission History and Physical Service Pager: [phone]  Patient name: Karl Miller Medical record number: [id number] Date of birth: October 08, 1955 Age: 63 y.o. Gender: male  Primary Care Provider: Administration, [facility] Consultants: Neurology  Code Status: Full (confirmed with ALF staff) Preferred Emergency Contact:  Brother Karl Miller) [phone] Mother Karl Miller) [phone]  Chief Complaint: facial droop   Assessment and Plan: Karl Miller is a 63 y.o. male presenting with facial droop . PMH is significant for HTN, CKD3, tobacco use, HLD  Stroke, new Presenting from ALF with symptoms of left facial droop. MR brain showing acute nonhemorrhagic lacunar infarct involving the right basal ganglia and centrum semi ovale, multiple remote nonhemorrhagic lacunar infarcts involving basal ganglia and centrum semi ovale, remote lacunar infarcts of thalami and brainstem, remote left PICA territory infarct, chronic microvascular ischemia. CT head extensive chronic ischemic disease, no acute intracranial hemorrhage or cortically based infarct. RF for stroke include HTN and HLD. BP to 124/87 upon admission. Neurology consulted. Per neurology hypotension is most likely precipitant of presentation. Not a TPA candidate given risks outweighing benefits. Recommended MRI and MRA of brain, continue atorvastatin and plavix, orthostatic vitals, and fluid bolus followed by IV infusion.  - admit as tele- obs under attending Dr. Owens Miller  - vitals per floor protocol - neuro following; appreciate recs - monitor on telemetry - permissive hypertension x24 hours, patient currently hypotensive. Will given fluid to help improve BP  - risk strat labs: hgb A1C, lipid, TSH - swallow eval - PT/OT - continue atorvastatin - ASA 325mg  Qd - Continue home plavix  Spinal mass  MRI showing 55mm posterior spinal mass at C4-5 level which may create mass effect on the  cervical spinal cord. Recommended for MRI of the cervical spine without and with contrast. Sensation and strength intact. Unlikely this is cause of symptoms on presentation.  -further evaluation with MRI of cervical spine  Dementia  Unclear baseline as ALF staff was unclear. At baseline patient can wax and wane. Does not talk much, mainly giggles. Is able to do ADLs with mininimal assistance. Patient oriented to person and place only. Unable to provide history. Home meds: donepezil -continue home meds -will try to contact brother/mother to get baseline and confirm full code status  HTN Home meds:prinzide (lisinopril HCTZ 20-12.5mg ) daily -holding home meds in setting of hypotension  -restart as BP tolerates   HLD Home meds: atorvastatin 20mg  -continue home meds  HFrEF Echo from 11/2017 showing LVEF 45-50%. Unclear dry weight. Euvolemic on exam  -monitor Is and Os -daily weights.   Acute on CKD3a Unclear BL however per chart review BL Cr ~1.5, GFR 50. Cr on admission 1.75. S/p LR bolus in ED.  -continue to monitor -1/2MIVF per neurology  -avoid nephrotoxic drugs   BPH Home meds: finasteride -holding home meds in setting of hypotension -strict Is and Os  Anemia, chronic Likely 2/2 CKD. Hgb ~11, but has been ~8 since 10/28/18.  -continue to monitor -continue iron supplementation   FEN/GI: NPO, pending swallow study  Prophylaxis: lovenox  Disposition: telemetry, obs. Attending Dr. Owens Miller   History of Present Illness:  Karl Miller is a 63 y.o. male presenting with facial droop.   Unable to obtain history from patient given baseline dementia. Patient only able to speak short sentences and mainly discussed machinery.   Per ALF staff patient was walking down hallway and started to lean to the right. He then began to have a right sided facial droop. BP was  measured at that time to be 80/60. CBG was 320.   Difficult to ascertain baseline for patient. Per ALF staff patient is  "very chill" and mainly stays in his room and watches TV. States that he does not talk much and mainly giggles. Unclear if patient is oriented at baseline. Is able to do most ADLs with minimal assistance.   On arrival to ED code stroke was called. CT head showing extensive chronic ischemic changes without intracranial hemorrhage. Neurology consulted who recommended admission and w/u including MRI/MRA as well as orthostatics. Recommended fluid bolus and IVF.   Review Of Systems: Per HPI with the following additions:   Review of Systems  Unable to perform ROS: Dementia    Patient Active Problem List   Diagnosis Date Noted  . CVA (cerebral vascular accident) ([place]) 11/15/2018  . Benign essential HTN   . Tobacco abuse   . TIA (transient ischemic attack)   . Diastolic dysfunction   . Prediabetes   . Acute blood loss anemia   . Stage 3 chronic kidney disease ([place])   . Hyperlipidemia   . Syphilis   . Cerebral embolism with cerebral infarction 12/05/2017  . Smoker   . Acute encephalopathy 12/04/2017  . HTN (hypertension) 12/04/2017  . AKI (acute kidney injury) ([place]) 12/04/2017  . Elevated PSA 12/04/2017  . Rhabdomyolysis 12/04/2017    Past Medical History: Past Medical History:  Diagnosis Date  . Acute encephalopathy 12/03/2017   Karl Miller 12/04/2017  . High cholesterol   . Hypertension   . Stroke ([place])   . TIA (transient ischemic attack) 12/03/2017   Karl Miller 12/04/2017    Past Surgical History: Past Surgical History:  Procedure Laterality Date  . LOOP RECORDER INSERTION N/A 12/07/2017   Procedure: LOOP RECORDER INSERTION;  Surgeon: Karl Haw, MD;  Location: [facility];  Service: Cardiovascular;  Laterality: N/A;  . NO PAST SURGERIES    . TEE WITHOUT CARDIOVERSION N/A 12/07/2017   Procedure: TRANSESOPHAGEAL ECHOCARDIOGRAM (TEE);  Surgeon: Karl Spark, MD;  Location: [facility] ENDOSCOPY;  Service: Cardiovascular;  Laterality: N/A;    Social History: Social  History   Tobacco Use  . Smoking status: Current Every Day Smoker    Packs/day: 0.50    Years: 24.00    Pack years: 12.00    Types: Cigarettes  . Smokeless tobacco: Never Used  Substance Use Topics  . Alcohol use: No    Frequency: Never  . Drug use: No   Additional social history: lives at [facility]  Please also refer to relevant sections of EMR.  Family History: History reviewed. No pertinent family history. Unknown, unable to obtain from ALF staff   Allergies and Medications: No Known Allergies No current facility-administered medications on file prior to encounter.    Current Outpatient Medications on File Prior to Encounter  Medication Sig Dispense Refill  . acetaminophen (TYLENOL) 500 MG tablet Take 500-1,000 mg by mouth every 6 (six) hours as needed for mild pain.    Marland Kitchen alum & mag hydroxide-simeth (GERI-LANTA) 200-200-20 MG/5ML suspension Take 30 mLs by mouth every 6 (six) hours as needed for indigestion or heartburn.    Marland Kitchen atorvastatin (LIPITOR) 20 MG tablet Take 1 tablet (20 mg total) by mouth daily at 6 PM. 30 tablet 0  . clopidogrel (PLAVIX) 75 MG tablet Take 75 mg by mouth daily.    Marland Kitchen donepezil (ARICEPT) 10 MG tablet Take 10 mg by mouth every morning.    . ferrous sulfate 325 (65  FE) MG tablet Take 325 mg by mouth daily.     . finasteride (PROSCAR) 5 MG tablet Take 5 mg by mouth daily.    Marland Kitchen guaifenesin (ROBITUSSIN) 100 MG/5ML syrup Take 200 mg by mouth every 6 (six) hours as needed for cough.    Marland Kitchen lisinopril-hydrochlorothiazide (PRINZIDE,ZESTORETIC) 20-12.5 MG tablet Take 1 tablet by mouth daily.    Marland Kitchen loperamide (IMODIUM) 2 MG capsule Take 2 mg by mouth daily as needed for diarrhea or loose stools.    . magnesium hydroxide (MILK OF MAGNESIA) 400 MG/5ML suspension Take 30 mLs by mouth at bedtime as needed for mild constipation.    Marland Kitchen neomycin-bacitracin-polymyxin (NEOSPORIN) ointment Apply 1 application topically daily as needed for wound care.    . meloxicam  (MOBIC) 7.5 MG tablet Take 1 tablet (7.5 mg total) by mouth daily. (Patient not taking: Reported on 11/15/2018) 10 tablet 0    Objective: BP 126/87   Pulse 73   Temp 98.4 F (36.9 C) (Oral)   Resp 16   Ht 6' (1.829 m)   Wt 76.9 kg   SpO2 98%   BMI 22.99 kg/m  Exam: General: awake and alert, sitting up in bed, NAD. Oriented to person and place, not time.  Eyes: PERRL, EOMI ENTM: moist mucous membranes Neck: supple, full ROM Cardiovascular: RRR, no MRG  Respiratory: CTAB, no wheezes, rales, or rhonchi, no increased WOB Gastrointestinal: soft, non tender, non distended, bowel sounds present  MSK: no edema, non tender. 5/5 muscle strength in all extremities. Normal grip strength  Derm: intact, no rashes  Neuro: left sided facial droop, sensation intact bilaterally.  Psych: normal affect, dementia   Labs and Imaging: CBC BMET  Recent Labs  Lab 11/15/18 0256 11/15/18 0302  WBC 5.5  --   HGB 8.3* 8.5*  HCT 26.5* 25.0*  PLT 343  --    Recent Labs  Lab 11/15/18 0256 11/15/18 0302  NA 140 143  K 3.7 3.9  CL 104  --   CO2 28  --   BUN 27*  --   CREATININE 1.75*  --   GLUCOSE 122*  --   CALCIUM 9.4  --       Ref. Range 11/15/2018 03:02  Sample type Unknown VENOUS  pH, Ven Latest Ref Range: 7.250 - 7.430  7.443 (H)  pCO2, Ven Latest Ref Range: 44.0 - 60.0 mmHg 41.4 (L)  pO2, Ven Latest Ref Range: 32.0 - 45.0 mmHg 28.0 (LL)  TCO2 Latest Ref Range: 22 - 32 mmol/L 30  Acid-Base Excess Latest Ref Range: 0.0 - 2.0 mmol/L 4.0 (H)  Bicarbonate Latest Ref Range: 20.0 - 28.0 mmol/L 28.3 (H)  O2 Saturation Latest Units: % 55.0  Patient temperature Unknown 37.0 C   Mr [facility] Wo Contrast  Result Date: 11/15/2018 CLINICAL DATA:  Muscle weakness, generalized.  Fall. EXAM: MRI HEAD WITHOUT CONTRAST MRA HEAD WITHOUT CONTRAST TECHNIQUE: Multiplanar, multiecho pulse sequences of the brain and surrounding structures were obtained without intravenous contrast. Angiographic images of  the head were obtained using MRA technique without contrast. COMPARISON:  CT head without contrast 11/15/2018. MRI brain 12/04/2017 FINDINGS: MRI HEAD FINDINGS Brain: Acute nonhemorrhagic infarct involves the right centrum semi ovale. Patient has multiple bilateral remote lacunar infarcts in a similar location. Remote lacunar infarcts also involves the basal ganglia bilaterally. There is no hemorrhage. Remote lacunar infarcts of the thalami bilaterally are stable. A remote lacunar infarct is again noted in the pons. A remote left PICA territory infarct is  stable. Moderate diffuse white matter disease is otherwise stable bilaterally. Advanced atrophy is noted. Vascular: Flow is present in the major intracranial arteries. Skull and upper cervical spine: Craniocervical junction is normal. There is decreased marrow signal in the upper cervical spine. This is new since the prior study. A posterior spinal mass is present at C4-5. This appears to have mass effect on the posterior aspect of the spinal cord. No other focal lesions are present. The Sinuses/Orbits: Mild mucosal thickening is present inferiorly in the left maxillary sinus. The paranasal sinuses and mastoid air cells are otherwise clear. The globes and orbits are within normal limits. MRA HEAD FINDINGS The internal carotid arteries are within normal limits from the high cervical segments through the ICA terminus bilaterally. The A1 and M1 segments are normal. No definite anterior communicating artery is present. MCA bifurcations are within normal limits. The ACA and MCA branch vessels are within normal limits. There is a moderate focal stenosis of the dominant left vertebral artery at the dural margin. The left PICA is just above this level. Right PICA is within normal limits. Vertebrobasilar junction is normal. Both posterior cerebral arteries originate from basilar tip. There is some attenuation of distal PCA branch vessels. IMPRESSION: 1. Acute nonhemorrhagic  lacunar infarct involving the right basal ganglia and centrum semi ovale. 2. Multiple remote nonhemorrhagic lacunar infarcts involving the basal ganglia and centrum semi ovale bilaterally. 3. Remote lacunar infarcts of the thalami and brainstem. 4. Remote left PICA territory infarct. 5. Advanced atrophy and white matter disease, consistent with chronic microvascular ischemia. 6. 10 mm posterior spinal mass at the C4-5 level may create mass effect on the cervical spinal cord. Recommend MRI of the cervical spine without and with contrast. The patient can receive half dose contrast due to renal insufficiency. 7. Decreased marrow signal in the upper cervical spine. This is likely related to kidney disease. 8. Moderate stenosis of the proximal left vertebral artery does not appear to be flow limiting. 9. Mild distal small vessel disease without other significant proximal stenosis, aneurysm, or branch vessel occlusion within the circle-of-Willis. Electronically Signed   By: San Morelle M.D.   On: 11/15/2018 05:43   Mr Brain Wo Contrast  Result Date: 11/15/2018 CLINICAL DATA:  Muscle weakness, generalized.  Fall. EXAM: MRI HEAD WITHOUT CONTRAST MRA HEAD WITHOUT CONTRAST TECHNIQUE: Multiplanar, multiecho pulse sequences of the brain and surrounding structures were obtained without intravenous contrast. Angiographic images of the head were obtained using MRA technique without contrast. COMPARISON:  CT head without contrast 11/15/2018. MRI brain 12/04/2017 FINDINGS: MRI HEAD FINDINGS Brain: Acute nonhemorrhagic infarct involves the right centrum semi ovale. Patient has multiple bilateral remote lacunar infarcts in a similar location. Remote lacunar infarcts also involves the basal ganglia bilaterally. There is no hemorrhage. Remote lacunar infarcts of the thalami bilaterally are stable. A remote lacunar infarct is again noted in the pons. A remote left PICA territory infarct is stable. Moderate diffuse white  matter disease is otherwise stable bilaterally. Advanced atrophy is noted. Vascular: Flow is present in the major intracranial arteries. Skull and upper cervical spine: Craniocervical junction is normal. There is decreased marrow signal in the upper cervical spine. This is new since the prior study. A posterior spinal mass is present at C4-5. This appears to have mass effect on the posterior aspect of the spinal cord. No other focal lesions are present. The Sinuses/Orbits: Mild mucosal thickening is present inferiorly in the left maxillary sinus. The paranasal sinuses and mastoid air cells  are otherwise clear. The globes and orbits are within normal limits. MRA HEAD FINDINGS The internal carotid arteries are within normal limits from the high cervical segments through the ICA terminus bilaterally. The A1 and M1 segments are normal. No definite anterior communicating artery is present. MCA bifurcations are within normal limits. The ACA and MCA branch vessels are within normal limits. There is a moderate focal stenosis of the dominant left vertebral artery at the dural margin. The left PICA is just above this level. Right PICA is within normal limits. Vertebrobasilar junction is normal. Both posterior cerebral arteries originate from basilar tip. There is some attenuation of distal PCA branch vessels. IMPRESSION: 1. Acute nonhemorrhagic lacunar infarct involving the right basal ganglia and centrum semi ovale. 2. Multiple remote nonhemorrhagic lacunar infarcts involving the basal ganglia and centrum semi ovale bilaterally. 3. Remote lacunar infarcts of the thalami and brainstem. 4. Remote left PICA territory infarct. 5. Advanced atrophy and white matter disease, consistent with chronic microvascular ischemia. 6. 10 mm posterior spinal mass at the C4-5 level may create mass effect on the cervical spinal cord. Recommend MRI of the cervical spine without and with contrast. The patient can receive half dose contrast due to  renal insufficiency. 7. Decreased marrow signal in the upper cervical spine. This is likely related to kidney disease. 8. Moderate stenosis of the proximal left vertebral artery does not appear to be flow limiting. 9. Mild distal small vessel disease without other significant proximal stenosis, aneurysm, or branch vessel occlusion within the circle-of-Willis. Electronically Signed   By: San Morelle M.D.   On: 11/15/2018 05:43   Ct Head Code Stroke Wo Contrast  Result Date: 11/15/2018 CLINICAL DATA:  Code stroke. 63 year old male with left facial droop. EXAM: CT HEAD WITHOUT CONTRAST TECHNIQUE: Contiguous axial images were obtained from the base of the skull through the vertex without intravenous contrast. COMPARISON:  Brain MRI, CTA head and neck 12/04/2017. FINDINGS: Brain: Chronic infarcts with encephalomalacia in the left cerebellum, right occipital lobe. Widespread Patchy and confluent cerebral white matter and bilateral deep gray matter hypodensity. Stable gray-white matter differentiation throughout the brain since 2019. No midline shift, ventriculomegaly, mass effect, evidence of mass lesion, intracranial hemorrhage or evidence of cortically based acute infarction. Vascular: Calcified atherosclerosis at the skull base. No suspicious intracranial vascular hyperdensity. Skull: No acute osseous abnormality identified. Sinuses/Orbits: Visualized paranasal sinuses and mastoids are stable and well pneumatized. Other: No acute orbit or scalp soft tissue findings. ASPECTS [facility] Stroke Program Early CT Score) - Ganglionic level infarction (caudate, lentiform nuclei, internal capsule, insula, M1-M3 cortex): 7 - Supraganglionic infarction (M4-M6 cortex): 3 Total score (0-10 with 10 being normal): 10 (chronic encephalomalacia) IMPRESSION: 1. Extensive chronic ischemic disease appears stable by CT since 2019. 2. No acute intracranial hemorrhage or cortically based infarct identified. 3. ASPECTS is 10. 4.  These results were communicated to Dr. Cheral Marker at 3:14 am on 11/15/2018 by text page via the [facility] messaging system. Electronically Signed   By: Genevie Ann M.D.   On: 11/15/2018 03:14    Caroline More, DO 11/15/2018, 9:29 AM PGY-2, [place] [facility] pager: [phone], text pages welcome

## 2018-11-15 NOTE — Progress Notes (Signed)
Pt admitted to the unit from ED; pt alert and verbally responsive; MAE x4; VSS: telemetry applied and verified with CCMD; NT called to second verify; skin intact with no opened wounds or pressure ulcer noted; pt oriented to the unit and room; fall/safety precaution and prevention education completed. Pt in bed with call light within reach and bed alarm on. Will continue to closely monitor. Francis Gaines Torii Royse RN   11/15/18 0941  Vitals  Temp 98.1 F (36.7 C)  Temp Source Oral  BP 136/83  MAP (mmHg) 99  BP Location Left Arm  BP Method Automatic  Patient Position (if appropriate) Lying  Pulse Rate 63  Resp 18  Oxygen Therapy  SpO2 100 %  O2 Device Room Air  Pain Assessment  Pain Scale 0-10  Pain Score 0  Height and Weight  Height 6' (1.829 m)  Weight 75.1 kg  Type of Scale Used Bed  Type of Weight Actual  BSA (Calculated - sq m) 1.95 sq meters  BMI (Calculated) 22.45  Weight in (lb) to have BMI = 25 183.9  MEWS Score  MEWS RR 0  MEWS Pulse 0  MEWS Systolic 0  MEWS LOC 0  MEWS Temp 0  MEWS Score 0  MEWS Score Color Green

## 2018-11-15 NOTE — Consult Note (Signed)
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 Patriciaann Clan is an 63 y.o. male.  HPI: The patient is a 63 year old demented black male with history of prior cerebrovascular accidents.  Evidently he resides in dementia unit at a nursing home and was noted to have a left facial droop and lip weakness.  He was brought to the ER worked up with a brain MRI which demonstrated an acute right lacunar infarct.  He was seen by neurology.  He was worked up with a brain MRI as well.  An incidental cervical tumor was noted and further worked up with a cervical MRI.  A neurosurgical consultation was requested.  Presently the patient is alert and pleasant.  He denies pain. Past Medical History:  Diagnosis Date  . Acute encephalopathy 12/03/2017   [name] 12/04/2017  . High cholesterol   . Hypertension   . Stroke ([place])   . TIA (transient ischemic attack) 12/03/2017   [name] 12/04/2017    Past Surgical History:  Procedure Laterality Date  . LOOP RECORDER INSERTION N/A 12/07/2017   Procedure: LOOP RECORDER INSERTION;  Surgeon: Constance Haw, MD;  Location: [facility];  Service: Cardiovascular;  Laterality: N/A;  . NO PAST SURGERIES    . TEE WITHOUT CARDIOVERSION N/A 12/07/2017   Procedure: TRANSESOPHAGEAL ECHOCARDIOGRAM (TEE);  Surgeon: Dorothy Spark, MD;  Location: [facility] ENDOS[facility];  Service: Cardiovascular;  Laterality: N/A;    History reviewed. No pertinent family history.  Social History:  reports that he has been smoking cigarettes. He has a 12.00 pack-year smoking history. He has never used smokeless tobacco. He reports that he does not drink alcohol or use drugs.  Allergies: No Known Allergies  Medications:  I have reviewed the patient's current medications. Prior to Admission:  Medications Prior to Admission  Medication Sig Dispense Refill Last Dose  . acetaminophen (TYLENOL) 500 MG tablet Take 500-1,000 mg by mouth every 6 (six) hours as needed  for mild pain.   unk  . alum & mag hydroxide-simeth (GERI-LANTA) 200-200-20 MG/5ML suspension Take 30 mLs by mouth every 6 (six) hours as needed for indigestion or heartburn.   unk  . atorvastatin (LIPITOR) 20 MG tablet Take 1 tablet (20 mg total) by mouth daily at 6 PM. 30 tablet 0 11/14/2018 at 2000  . clopidogrel (PLAVIX) 75 MG tablet Take 75 mg by mouth daily.   11/14/2018 at 0800  . donepezil (ARICEPT) 10 MG tablet Take 10 mg by mouth every morning.   11/14/2018 at 0800  . ferrous sulfate 325 (65 FE) MG tablet Take 325 mg by mouth daily.    11/14/2018 at 0800  . finasteride (PROSCAR) 5 MG tablet Take 5 mg by mouth daily.   11/14/2018 at 0800  . guaifenesin (ROBITUSSIN) 100 MG/5ML syrup Take 200 mg by mouth every 6 (six) hours as needed for cough.   unk  . lisinopril-hydrochlorothiazide (PRINZIDE,ZESTORETIC) 20-12.5 MG tablet Take 1 tablet by mouth daily.   11/14/2018 at 0800  . loperamide (IMODIUM) 2 MG capsule Take 2 mg by mouth daily as needed for diarrhea or loose stools.   unk  . magnesium hydroxide (MILK OF MAGNESIA) 400 MG/5ML suspension Take 30 mLs by mouth at bedtime as needed for mild constipation.   unk  . neomycin-bacitracin-polymyxin (NEOSPORIN) ointment Apply 1 application topically daily as needed for wound care.   unk  . meloxicam (MOBIC) 7.5 MG tablet Take 1 tablet (7.5 mg total) by mouth daily. (Patient not taking: Reported on  11/15/2018) 10 tablet 0 Not Taking at Unknown time   Scheduled: . aspirin  300 mg Rectal Daily   Or  . aspirin  325 mg Oral Daily  . atorvastatin  20 mg Oral q1800  . clopidogrel  75 mg Oral Daily  . donepezil  10 mg Oral BH-q7a  . ferrous sulfate  325 mg Oral Daily  . heparin  5,000 Units Subcutaneous Q8H   Continuous: . sodium chloride 50 mL/hr at 11/15/18 1304   ZOX:[id number] **OR** acetaminophen (TYLENOL) oral liquid 160 mg/5 mL **OR** acetaminophen Anti-infectives (From admission, onward)   None       Results for orders placed or  performed during the hospital encounter of 11/15/18 (from the past 48 hour(s))  CBG monitoring, ED     Status: Abnormal   Collection Time: 11/15/18  2:52 AM  Result Value Ref Range   Glucose-Capillary 106 (H) 70 - 99 mg/dL  Protime-INR     Status: None   Collection Time: 11/15/18  2:56 AM  Result Value Ref Range   Prothrombin Time 14.2 11.4 - 15.2 seconds   INR 1.1 0.8 - 1.2    Comment: (NOTE) INR goal varies based on device and disease states. Performed at [facility], [place] [address] [place], [place] [address]   APTT     Status: None   Collection Time: 11/15/18  2:56 AM  Result Value Ref Range   aPTT 26 24 - 36 seconds    Comment: Performed at [facility] [address] [place], [place] [address]  CBC     Status: Abnormal   Collection Time: 11/15/18  2:56 AM  Result Value Ref Range   WBC 5.5 4.0 - 10.5 K/uL   RBC 2.82 (L) 4.22 - 5.81 MIL/uL   Hemoglobin 8.3 (L) 13.0 - 17.0 g/dL   HCT 26.5 (L) 39.0 - 52.0 %   MCV 94.0 80.0 - 100.0 fL   MCH 29.4 26.0 - 34.0 pg   MCHC 31.3 30.0 - 36.0 g/dL   RDW 14.7 11.5 - 15.5 %   Platelets 343 150 - 400 K/uL   nRBC 0.0 0.0 - 0.2 %    Comment: Performed at [facility], [place] [address] [place], [place] [address]  Differential     Status: None   Collection Time: 11/15/18  2:56 AM  Result Value Ref Range   Neutrophils Relative % 69 %   Neutro Abs 3.8 1.7 - 7.7 K/uL   Lymphocytes Relative 13 %   Lymphs Abs 0.7 0.7 - 4.0 K/uL   Monocytes Relative 12 %   Monocytes Absolute 0.6 0.1 - 1.0 K/uL   Eosinophils Relative 4 %   Eosinophils Absolute 0.2 0.0 - 0.5 K/uL   Basophils Relative 1 %   Basophils Absolute 0.0 0.0 - 0.1 K/uL   Immature Granulocytes 1 %   Abs Immature Granulocytes 0.03 0.00 - 0.07 K/uL    Comment: Performed at [facility], [place] [address] [place],  [address]  Comprehensive metabolic panel     Status: Abnormal   Collection Time: 11/15/18  2:56 AM  Result Value Ref Range   Sodium 140 135  - 145 mmol/L   Potassium 3.7 3.5 - 5.1 mmol/L   Chloride 104 98 - 111 mmol/L   CO2 28 22 - 32 mmol/L   Glucose, Bld 122 (H) 70 - 99 mg/dL   BUN 27 (H) 8 - 23 mg/dL   Creatinine, Ser 1.75 (  H) 0.61 - 1.24 mg/dL   Calcium 9.4 8.9 - 10.3 mg/dL   Total Protein 6.2 (L) 6.5 - 8.1 g/dL   Albumin 3.2 (L) 3.5 - 5.0 g/dL   AST 19 15 - 41 U/L   ALT 19 0 - 44 U/L   Alkaline Phosphatase 44 38 - 126 U/L   Total Bilirubin 0.2 (L) 0.3 - 1.2 mg/dL   GFR calc non Af Amer 40 (L) >60 mL/min   GFR calc Af Amer 46 (L) >60 mL/min   Anion gap 8 5 - 15    Comment: Performed at [facility] [address], [place], [place] [address]  CBG monitoring, ED     Status: Abnormal   Collection Time: 11/15/18  2:56 AM  Result Value Ref Range   Glucose-Capillary 114 (H) 70 - 99 mg/dL  Hemoglobin A1c     Status: None   Collection Time: 11/15/18  2:56 AM  Result Value Ref Range   Hgb A1c MFr Bld 5.2 4.8 - 5.6 %    Comment: (NOTE) Pre diabetes:          5.7%-6.4% Diabetes:              >6.4% Glycemic control for   <7.0% adults with diabetes    Mean Plasma Glucose 102.54 mg/dL    Comment: Performed at [facility] [address] [place], [place] [address]  Lipid panel     Status: None   Collection Time: 11/15/18  2:56 AM  Result Value Ref Range   Cholesterol 98 0 - 200 mg/dL   Triglycerides 34 <150 mg/dL   HDL 52 >40 mg/dL   Total CHOL/HDL Ratio 1.9 RATIO   VLDL 7 0 - 40 mg/dL   LDL Cholesterol 39 0 - 99 mg/dL    Comment:        Total Cholesterol/HDL:CHD Risk Coronary Heart Disease Risk Table                     Men   Women  1/2 Average Risk   3.4   3.3  Average Risk       5.0   4.4  2 X Average Risk   9.6   7.1  3 X Average Risk  23.4   11.0        Use the calculated Patient Ratio above and the CHD Risk Table to determine the patient's CHD Risk.        ATP III CLASSIFICATION (LDL):  <100     mg/dL   Optimal  100-129  mg/dL   Near or Above                    Optimal  130-159  mg/dL    Borderline  160-189  mg/dL   High  >190     mg/dL   Very High Performed at [facility] [address] [place], [place] [address]   POCT I-Stat EG7     Status: Abnormal   Collection Time: 11/15/18  3:02 AM  Result Value Ref Range   pH, Ven 7.443 (H) 7.250 - 7.430   pCO2, Ven 41.4 (L) 44.0 - 60.0 mmHg   pO2, Ven 28.0 (LL) 32.0 - 45.0 mmHg   Bicarbonate 28.3 (H) 20.0 - 28.0 mmol/L   TCO2 30 22 - 32 mmol/L   O2 Saturation 55.0 %   Acid-Base Excess 4.0 (H) 0.0 - 2.0 mmol/L   Sodium 143  135 - 145 mmol/L   Potassium 3.9 3.5 - 5.1 mmol/L   Calcium, Ion 1.24 1.15 - 1.40 mmol/L   HCT 25.0 (L) 39.0 - 52.0 %   Hemoglobin 8.5 (L) 13.0 - 17.0 g/dL   Patient temperature 37.0 C    Sample type VENOUS    Comment NOTIFIED PHYSICIAN   SARS Coronavirus 2 [facility] order, Performed in [place] hospital lab)     Status: None   Collection Time: 11/15/18  7:55 AM  Result Value Ref Range   SARS Coronavirus 2 NEGATIVE NEGATIVE    Comment: (NOTE) If result is NEGATIVE SARS-CoV-2 target nucleic acids are NOT DETECTED. The SARS-CoV-2 RNA is generally detectable in upper and lower  respiratory specimens during the acute phase of infection. The lowest  concentration of SARS-CoV-2 viral copies this assay can detect is 250  copies / mL. A negative result does not preclude SARS-CoV-2 infection  and should not be used as the sole basis for treatment or other  patient management decisions.  A negative result may occur with  improper specimen collection / handling, submission of specimen other  than nasopharyngeal swab, presence of viral mutation(s) within the  areas targeted by this assay, and inadequate number of viral copies  (<250 copies / mL). A negative result must be combined with clinical  observations, patient history, and epidemiological information. If result is POSITIVE SARS-CoV-2 target nucleic acids are DETECTED. The SARS-CoV-2 RNA is generally detectable in upper and lower   respiratory specimens dur ing the acute phase of infection.  Positive  results are indicative of active infection with SARS-CoV-2.  Clinical  correlation with patient history and other diagnostic information is  necessary to determine patient infection status.  Positive results do  not rule out bacterial infection or co-infection with other viruses. If result is PRESUMPTIVE POSTIVE SARS-CoV-2 nucleic acids MAY BE PRESENT.   A presumptive positive result was obtained on the submitted specimen  and confirmed on repeat testing.  While 2019 novel coronavirus  (SARS-CoV-2) nucleic acids may be present in the submitted sample  additional confirmatory testing may be necessary for epidemiological  and / or clinical management purposes  to differentiate between  SARS-CoV-2 and other Sarbecovirus currently known to infect humans.  If clinically indicated additional testing with an alternate test  methodology [phone]) is advised. The SARS-CoV-2 RNA is generally  detectable in upper and lower respiratory sp ecimens during the acute  phase of infection. The expected result is Negative. Fact Sheet for Patients:  StrictlyIdeas.no Fact Sheet for Healthcare Providers: BankingDealers.co.za This test is not yet approved or cleared by the [place] FDA and has been authorized for detection and/or diagnosis of SARS-CoV-2 by FDA under an Emergency Use Authorization (EUA).  This EUA will remain in effect (meaning this test can be used) for the duration of the COVID-19 declaration under Section 564(b)(1) of the Act, 21 U.S.C. section 360bbb-3(b)(1), unless the authorization is terminated or revoked sooner. Performed at [facility], [place] [address], [place],  [address]   MRSA PCR Screening     Status: None   Collection Time: 11/15/18 12:18 PM  Result Value Ref Range   MRSA by PCR NEGATIVE NEGATIVE    Comment:        The GeneXpert MRSA  Assay (FDA approved for NASAL specimens only), is one component of a comprehensive MRSA colonization surveillance program. It is not intended to diagnose MRSA infection nor to guide or monitor treatment for MRSA infections. Performed  at [facility], [place] [address], [place], [place] [address]   TSH     Status: None   Collection Time: 11/15/18 12:49 PM  Result Value Ref Range   TSH 1.067 0.350 - 4.500 uIU/mL    Comment: Performed by a 3rd Generation assay with a functional sensitivity of <=0.01 uIU/mL. Performed at [facility], [place] [address], [place], [place] [address]     [name] Head Wo Contrast  Result Date: 11/15/2018 CLINICAL DATA:  Muscle weakness, generalized.  Fall. EXAM: MRI HEAD WITHOUT CONTRAST MRA HEAD WITHOUT CONTRAST TECHNIQUE: Multiplanar, multiecho pulse sequences of the brain and surrounding structures were obtained without intravenous contrast. Angiographic images of the head were obtained using MRA technique without contrast. COMPARISON:  CT head without contrast 11/15/2018. MRI brain 12/04/2017 FINDINGS: MRI HEAD FINDINGS Brain: Acute nonhemorrhagic infarct involves the right centrum semi ovale. Patient has multiple bilateral remote lacunar infarcts in a similar location. Remote lacunar infarcts also involves the basal ganglia bilaterally. There is no hemorrhage. Remote lacunar infarcts of the thalami bilaterally are stable. A remote lacunar infarct is again noted in the pons. A remote left PICA territory infarct is stable. Moderate diffuse white matter disease is otherwise stable bilaterally. Advanced atrophy is noted. Vascular: Flow is present in the major intracranial arteries. Skull and upper cervical spine: Craniocervical junction is normal. There is decreased marrow signal in the upper cervical spine. This is new since the prior study. A posterior spinal mass is present at C4-5. This appears to have mass effect on the posterior aspect of the spinal cord. No  other focal lesions are present. The Sinuses/Orbits: Mild mucosal thickening is present inferiorly in the left maxillary sinus. The paranasal sinuses and mastoid air cells are otherwise clear. The globes and orbits are within normal limits. MRA HEAD FINDINGS The internal carotid arteries are within normal limits from the high cervical segments through the ICA terminus bilaterally. The A1 and M1 segments are normal. No definite anterior communicating artery is present. MCA bifurcations are within normal limits. The ACA and MCA branch vessels are within normal limits. There is a moderate focal stenosis of the dominant left vertebral artery at the dural margin. The left PICA is just above this level. Right PICA is within normal limits. Vertebrobasilar junction is normal. Both posterior cerebral arteries originate from basilar tip. There is some attenuation of distal PCA branch vessels. IMPRESSION: 1. Acute nonhemorrhagic lacunar infarct involving the right basal ganglia and centrum semi ovale. 2. Multiple remote nonhemorrhagic lacunar infarcts involving the basal ganglia and centrum semi ovale bilaterally. 3. Remote lacunar infarcts of the thalami and brainstem. 4. Remote left PICA territory infarct. 5. Advanced atrophy and white matter disease, consistent with chronic microvascular ischemia. 6. 10 mm posterior spinal mass at the C4-5 level may create mass effect on the cervical spinal cord. Recommend MRI of the cervical spine without and with contrast. The patient can receive half dose contrast due to renal insufficiency. 7. Decreased marrow signal in the upper cervical spine. This is likely related to kidney disease. 8. Moderate stenosis of the proximal left vertebral artery does not appear to be flow limiting. 9. Mild distal small vessel disease without other significant proximal stenosis, aneurysm, or branch vessel occlusion within the circle-of-Willis. Electronically Signed   By: San Morelle M.D.   On:  11/15/2018 05:43   Mr Brain Wo Contrast  Result Date: 11/15/2018 CLINICAL DATA:  Muscle weakness, generalized.  Fall. EXAM: MRI HEAD WITHOUT CONTRAST  MRA HEAD WITHOUT CONTRAST TECHNIQUE: Multiplanar, multiecho pulse sequences of the brain and surrounding structures were obtained without intravenous contrast. Angiographic images of the head were obtained using MRA technique without contrast. COMPARISON:  CT head without contrast 11/15/2018. MRI brain 12/04/2017 FINDINGS: MRI HEAD FINDINGS Brain: Acute nonhemorrhagic infarct involves the right centrum semi ovale. Patient has multiple bilateral remote lacunar infarcts in a similar location. Remote lacunar infarcts also involves the basal ganglia bilaterally. There is no hemorrhage. Remote lacunar infarcts of the thalami bilaterally are stable. A remote lacunar infarct is again noted in the pons. A remote left PICA territory infarct is stable. Moderate diffuse white matter disease is otherwise stable bilaterally. Advanced atrophy is noted. Vascular: Flow is present in the major intracranial arteries. Skull and upper cervical spine: Craniocervical junction is normal. There is decreased marrow signal in the upper cervical spine. This is new since the prior study. A posterior spinal mass is present at C4-5. This appears to have mass effect on the posterior aspect of the spinal cord. No other focal lesions are present. The Sinuses/Orbits: Mild mucosal thickening is present inferiorly in the left maxillary sinus. The paranasal sinuses and mastoid air cells are otherwise clear. The globes and orbits are within normal limits. MRA HEAD FINDINGS The internal carotid arteries are within normal limits from the high cervical segments through the ICA terminus bilaterally. The A1 and M1 segments are normal. No definite anterior communicating artery is present. MCA bifurcations are within normal limits. The ACA and MCA branch vessels are within normal limits. There is a moderate  focal stenosis of the dominant left vertebral artery at the dural margin. The left PICA is just above this level. Right PICA is within normal limits. Vertebrobasilar junction is normal. Both posterior cerebral arteries originate from basilar tip. There is some attenuation of distal PCA branch vessels. IMPRESSION: 1. Acute nonhemorrhagic lacunar infarct involving the right basal ganglia and centrum semi ovale. 2. Multiple remote nonhemorrhagic lacunar infarcts involving the basal ganglia and centrum semi ovale bilaterally. 3. Remote lacunar infarcts of the thalami and brainstem. 4. Remote left PICA territory infarct. 5. Advanced atrophy and white matter disease, consistent with chronic microvascular ischemia. 6. 10 mm posterior spinal mass at the C4-5 level may create mass effect on the cervical spinal cord. Recommend MRI of the cervical spine without and with contrast. The patient can receive half dose contrast due to renal insufficiency. 7. Decreased marrow signal in the upper cervical spine. This is likely related to kidney disease. 8. Moderate stenosis of the proximal left vertebral artery does not appear to be flow limiting. 9. Mild distal small vessel disease without other significant proximal stenosis, aneurysm, or branch vessel occlusion within the circle-of-Willis. Electronically Signed   By: San Morelle M.D.   On: 11/15/2018 05:43   Mr Cervical Spine W Wo Contrast  Result Date: 11/15/2018 CLINICAL DATA:  Follow-up to abnormality seen on brain MRI. Generalized muscle weakness with fall. EXAM: MRI CERVICAL SPINE WITHOUT AND WITH CONTRAST TECHNIQUE: Multiplanar and multiecho pulse sequences of the cervical spine, to include the craniocervical junction and cervicothoracic junction, were obtained without and with intravenous contrast. CONTRAST:  Gadavist 10 mL. COMPARISON:  MR brain earlier in the day.  CTA head neck 12/04/2017. FINDINGS: Significant motion degradation. The study is of borderline  diagnostic utility. Alignment: Physiologic. Vertebrae: No fracture, evidence of discitis, or bone lesion. 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 The lesion measures  roughly 8 x 8 x 12 mm. The mass demonstrates intermediate T1, decreased T2, and increased STIR intensity. There is avid homogeneous postcontrast enhancement. Abnormal cord signal is difficult to confirm or exclude. There is significant spinal stenosis and displacement of the spinal cord RIGHT-to-LEFT. Differential considerations include meningioma versus schwannoma. Posterior Fossa, vertebral arteries, paraspinal tissues: Posterior fossa unremarkable. Vertebral flow voids are maintained. No definite neck masses. Disc levels: Assessment of individual levels or disc pathology is limited. Disc space narrowing is most pronounced at the C6-7 level, where there appears to be a shallow protrusion. Compared with the previous CT study from May 2019, the mass was present but difficult to see. 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 Differential considerations include meningioma versus schwannoma. Neurosurgical consultation is warranted. Motion degradation makes assessment of cord edema problematic. Multilevel spondylosis appears most prominent at C6-C7. Electronically Signed   By: Staci Righter M.D.   On: 11/15/2018 12:56   Ct Head Code Stroke Wo Contrast  Result Date: 11/15/2018 CLINICAL DATA:  Code stroke. 63 year old male with left facial droop. EXAM: CT HEAD WITHOUT CONTRAST TECHNIQUE: Contiguous axial images were obtained from the base of the skull through the vertex without intravenous contrast. COMPARISON:  Brain MRI, CTA head and neck 12/04/2017. FINDINGS: Brain: Chronic infarcts with encephalomalacia in the left cerebellum, right  occipital lobe. Widespread Patchy and confluent cerebral white matter and bilateral deep gray matter hypodensity. Stable gray-white matter differentiation throughout the brain since 2019. No midline shift, ventriculomegaly, mass effect, evidence of mass lesion, intracranial hemorrhage or evidence of cortically based acute infarction. Vascular: Calcified atherosclerosis at the skull base. No suspicious intracranial vascular hyperdensity. Skull: No acute osseous abnormality identified. Sinuses/Orbits: Visualized paranasal sinuses and mastoids are stable and well pneumatized. Other: No acute orbit or scalp soft tissue findings. ASPECTS [facility] Stroke Program Early CT Score) - Ganglionic level infarction (caudate, lentiform nuclei, internal capsule, insula, M1-M3 cortex): 7 - Supraganglionic infarction (M4-M6 cortex): 3 Total score (0-10 with 10 being normal): 10 (chronic encephalomalacia) IMPRESSION: 1. Extensive chronic ischemic disease appears stable by CT since 2019. 2. No acute intracranial hemorrhage or cortically based infarct identified. 3. ASPECTS is 10. 4. These results were communicated to Dr. Cheral Marker at 3:14 am on 11/15/2018 by text page via the [facility] messaging system. Electronically Signed   By: Genevie Ann M.D.   On: 11/15/2018 03:14    ROS: As above Blood pressure 138/85, pulse 67, temperature 98.2 F (36.8 C), temperature source Oral, resp. rate 16, height 6' (1.829 m), weight 75.1 kg, SpO2 98 %. Estimated body mass index is 22.45 kg/m as calculated from the following:   Height as of this encounter: 6' (1.829 m).   Weight as of this encounter: 75.1 kg.  Physical Exam  General: An alert and pleasant 63 year old black male with a left facial droop in no apparent distress  HEENT: Normocephalic, atraumatic, pupils equal round reactive light, extraocular muscles are intact.  Neck: Unremarkable  Thorax: Symmetric  Abdomen: Soft  Extremities: Unremarkable  Neurologic exam: The patient is  alert and oriented x2, person, [facility], [facility].  He could not tell me the month or the year.  Cranial nerves II through XII were examined bilaterally and grossly normal except he has a left facial droop.  Vision and hearing are grossly normal bilaterally.  His motor strength is grossly normal in spinal bicep, tricep, handgrip,  gastrocnemius.  Cerebellar function was intact to rapid alternating movements.  Sensory function was unremarkable to light touch sensation bilaterally.  I have reviewed the patient's cervical MRI performed today with and without contrast.  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  The patient does not seem like he is a very good surgical candidate given his multi-infarct dementia.  This cervical tumor does not need to be removed acutely, and perhaps not at all.  Please have him follow-up with me in the office after he recovers from his stroke.  Please call if I can be of further assistance.  Ophelia Charter 11/15/2018, 4:22 PM

## 2018-11-15 NOTE — ED Provider Notes (Signed)
Emergency Department Provider Note   I have reviewed the triage vital signs and the nursing notes.   HISTORY  Chief Complaint Code Stroke   HPI Karl Miller is a 63 y.o. male with a history of dementia at baseline but also with history of acute encephalopathy, hyperlipidemia, hypertension, stroke, TIAs, hypertension and smoking presents emergency department today with acute change in mental status and neurologic change.  Patient lives at a facility states that he was walking down the hall and then got very dizzy all of a sudden had a left facial droop so EMS was called.  On EMS arrival he noticed left facial droop but no significant extremity weakness.  Vitals within normal limits.  Code stroke called and brought here for further evaluation.    History limited by dementia, level 5 caveat applies.     Past Medical History:  Diagnosis Date   Acute encephalopathy 12/03/2017   [name] 12/04/2017   High cholesterol    Hypertension    Stroke [facility])    TIA (transient ischemic attack) 12/03/2017   [name] 12/04/2017    Patient Active Problem List   Diagnosis Date Noted   CVA (cerebral vascular accident) ([place]) 11/15/2018   Benign essential HTN    Tobacco abuse    TIA (transient ischemic attack)    Diastolic dysfunction    Prediabetes    Acute blood loss anemia    Stage 3 chronic kidney disease ([place])    Hyperlipidemia    Syphilis    Cerebral embolism with cerebral infarction 12/05/2017   Smoker    Acute encephalopathy 12/04/2017   HTN (hypertension) 12/04/2017   AKI (acute kidney injury) ([place]) 12/04/2017   Elevated PSA 12/04/2017   Rhabdomyolysis 12/04/2017    Past Surgical History:  Procedure Laterality Date   LOOP RECORDER INSERTION N/A 12/07/2017   Procedure: LOOP RECORDER INSERTION;  Surgeon: Constance Haw, MD;  Location: [facility] LAB;  Service: Cardiovascular;  Laterality: N/A;   NO PAST SURGERIES     TEE WITHOUT CARDIOVERSION  N/A 12/07/2017   Procedure: TRANSESOPHAGEAL ECHOCARDIOGRAM (TEE);  Surgeon: Dorothy Spark, MD;  Location: [facility] ENDOSCOPY;  Service: Cardiovascular;  Laterality: N/A;    Current Outpatient Rx   Order #: [id number] Class: Historical Med   Order #: [id number] Class: Historical Med   Order #: [id number] Class: Print   Order #: [id number] Class: Historical Med   Order #: [id number] Class: Historical Med   Order #: [id number] Class: Historical Med   Order #: [id number] Class: Historical Med   Order #: [id number] Class: Historical Med   Order #: [id number] Class: Historical Med   Order #: [id number] Class: Historical Med   Order #: [id number] Class: Historical Med   Order #: [id number] Class: Historical Med   Order #: [id number] Class: Print    Allergies Patient has no known allergies.  History reviewed. No pertinent family history.  Social History Social History   Tobacco Use   Smoking status: Current Every Day Smoker    Packs/day: 0.50    Years: 24.00    Pack years: 12.00    Types: Cigarettes   Smokeless tobacco: Never Used  Substance Use Topics   Alcohol use: No    Frequency: Never   Drug use: No    Review of Systems  All other systems negative except as documented in the HPI. All pertinent positives and negatives as reviewed in the HPI. ____________________________________________   PHYSICAL EXAM:  VITAL SIGNS: Vitals:   11/15/18 0430 11/15/18 0630 11/15/18 0700 11/15/18 0715  BP: 108/73 117/75 117/77 127/88  Pulse: 60 (!) 57 (!) 56 (!) 57  Resp: 15 18    Temp:      TempSrc:      SpO2: 100% 100% 100% 100%  Weight:      Height:       Constitutional: Alert and oriented. Well appearing and in no acute distress. Eyes: Conjunctivae are normal. PERRL. EOMI. Head: Atraumatic. Nose: No congestion/rhinnorhea. Mouth/Throat: Mucous membranes are moist.  Oropharynx non-erythematous. Neck: No stridor.  No meningeal signs.   Cardiovascular: Normal rate, regular rhythm. Good  peripheral circulation. Grossly normal heart sounds.   Respiratory: Normal respiratory effort.  No retractions. Lungs CTAB. Gastrointestinal: Soft and nontender. No distention.  Musculoskeletal: No lower extremity tenderness nor edema. No gross deformities of extremities. Neurologic:  Slurred speech. Left facial droop sparing forehead. No obvious UE/LE weakness.  Skin:  Skin is warm, dry and intact. No rash noted.   ____________________________________________   LABS (all labs ordered are listed, but only abnormal results are displayed)  Labs Reviewed  CBC - Abnormal; Notable for the following components:      Result Value   RBC 2.82 (*)    Hemoglobin 8.3 (*)    HCT 26.5 (*)    All other components within normal limits  COMPREHENSIVE METABOLIC PANEL - Abnormal; Notable for the following components:   Glucose, Bld 122 (*)    BUN 27 (*)    Creatinine, Ser 1.75 (*)    Total Protein 6.2 (*)    Albumin 3.2 (*)    Total Bilirubin 0.2 (*)    GFR calc non Af Amer 40 (*)    GFR calc Af Amer 46 (*)    All other components within normal limits  CBG MONITORING, ED - Abnormal; Notable for the following components:   Glucose-Capillary 106 (*)    All other components within normal limits  CBG MONITORING, ED - Abnormal; Notable for the following components:   Glucose-Capillary 114 (*)    All other components within normal limits  POCT I-STAT EG7 - Abnormal; Notable for the following components:   pH, Ven 7.443 (*)    pCO2, Ven 41.4 (*)    pO2, Ven 28.0 (*)    Bicarbonate 28.3 (*)    Acid-Base Excess 4.0 (*)    HCT 25.0 (*)    Hemoglobin 8.5 (*)    All other components within normal limits  PROTIME-INR  APTT  DIFFERENTIAL  I-STAT CREATININE, ED   ____________________________________________  EKG   EKG Interpretation  Date/Time:  Monday November 15 2018 03:22:37 EDT Ventricular Rate:  71 PR Interval:    QRS Duration: 107 QT Interval:  371 QTC Calculation: 404 R  Axis:   76 Text Interpretation:  Sinus rhythm Ventricular premature complex No significant change since last tracing Confirmed by Merrily Pew [phone]) on 11/15/2018 3:57:25 AM       ____________________________________________  RADIOLOGY  [name] Head Wo Contrast  Result Date: 11/15/2018 CLINICAL DATA:  Muscle weakness, generalized.  Fall. EXAM: MRI HEAD WITHOUT CONTRAST MRA HEAD WITHOUT CONTRAST TECHNIQUE: Multiplanar, multiecho pulse sequences of the brain and surrounding structures were obtained without intravenous contrast. Angiographic images of the head were obtained using MRA technique without contrast. COMPARISON:  CT head without contrast 11/15/2018. MRI brain 12/04/2017 FINDINGS: MRI HEAD FINDINGS Brain: Acute nonhemorrhagic infarct involves the right centrum semi ovale. Patient has multiple bilateral remote lacunar infarcts in a similar location. Remote lacunar infarcts also involves the basal ganglia bilaterally. There is no hemorrhage. Remote lacunar infarcts of  the thalami bilaterally are stable. A remote lacunar infarct is again noted in the pons. A remote left PICA territory infarct is stable. Moderate diffuse white matter disease is otherwise stable bilaterally. Advanced atrophy is noted. Vascular: Flow is present in the major intracranial arteries. Skull and upper cervical spine: Craniocervical junction is normal. There is decreased marrow signal in the upper cervical spine. This is new since the prior study. A posterior spinal mass is present at C4-5. This appears to have mass effect on the posterior aspect of the spinal cord. No other focal lesions are present. The Sinuses/Orbits: Mild mucosal thickening is present inferiorly in the left maxillary sinus. The paranasal sinuses and mastoid air cells are otherwise clear. The globes and orbits are within normal limits. MRA HEAD FINDINGS The internal carotid arteries are within normal limits from the high cervical segments through the ICA  terminus bilaterally. The A1 and M1 segments are normal. No definite anterior communicating artery is present. MCA bifurcations are within normal limits. The ACA and MCA branch vessels are within normal limits. There is a moderate focal stenosis of the dominant left vertebral artery at the dural margin. The left PICA is just above this level. Right PICA is within normal limits. Vertebrobasilar junction is normal. Both posterior cerebral arteries originate from basilar tip. There is some attenuation of distal PCA branch vessels. IMPRESSION: 1. Acute nonhemorrhagic lacunar infarct involving the right basal ganglia and centrum semi ovale. 2. Multiple remote nonhemorrhagic lacunar infarcts involving the basal ganglia and centrum semi ovale bilaterally. 3. Remote lacunar infarcts of the thalami and brainstem. 4. Remote left PICA territory infarct. 5. Advanced atrophy and white matter disease, consistent with chronic microvascular ischemia. 6. 10 mm posterior spinal mass at the C4-5 level may create mass effect on the cervical spinal cord. Recommend MRI of the cervical spine without and with contrast. The patient can receive half dose contrast due to renal insufficiency. 7. Decreased marrow signal in the upper cervical spine. This is likely related to kidney disease. 8. Moderate stenosis of the proximal left vertebral artery does not appear to be flow limiting. 9. Mild distal small vessel disease without other significant proximal stenosis, aneurysm, or branch vessel occlusion within the circle-of-Willis. Electronically Signed   By: San Morelle M.D.   On: 11/15/2018 05:43   Mr Brain Wo Contrast  Result Date: 11/15/2018 CLINICAL DATA:  Muscle weakness, generalized.  Fall. EXAM: MRI HEAD WITHOUT CONTRAST MRA HEAD WITHOUT CONTRAST TECHNIQUE: Multiplanar, multiecho pulse sequences of the brain and surrounding structures were obtained without intravenous contrast. Angiographic images of the head were obtained  using MRA technique without contrast. COMPARISON:  CT head without contrast 11/15/2018. MRI brain 12/04/2017 FINDINGS: MRI HEAD FINDINGS Brain: Acute nonhemorrhagic infarct involves the right centrum semi ovale. Patient has multiple bilateral remote lacunar infarcts in a similar location. Remote lacunar infarcts also involves the basal ganglia bilaterally. There is no hemorrhage. Remote lacunar infarcts of the thalami bilaterally are stable. A remote lacunar infarct is again noted in the pons. A remote left PICA territory infarct is stable. Moderate diffuse white matter disease is otherwise stable bilaterally. Advanced atrophy is noted. Vascular: Flow is present in the major intracranial arteries. Skull and upper cervical spine: Craniocervical junction is normal. There is decreased marrow signal in the upper cervical spine. This is new since the prior study. A posterior spinal mass is present at C4-5. This appears to have mass effect on the posterior aspect of the spinal cord. No other focal lesions  are present. The Sinuses/Orbits: Mild mucosal thickening is present inferiorly in the left maxillary sinus. The paranasal sinuses and mastoid air cells are otherwise clear. The globes and orbits are within normal limits. MRA HEAD FINDINGS The internal carotid arteries are within normal limits from the high cervical segments through the ICA terminus bilaterally. The A1 and M1 segments are normal. No definite anterior communicating artery is present. MCA bifurcations are within normal limits. The ACA and MCA branch vessels are within normal limits. There is a moderate focal stenosis of the dominant left vertebral artery at the dural margin. The left PICA is just above this level. Right PICA is within normal limits. Vertebrobasilar junction is normal. Both posterior cerebral arteries originate from basilar tip. There is some attenuation of distal PCA branch vessels. IMPRESSION: 1. Acute nonhemorrhagic lacunar infarct  involving the right basal ganglia and centrum semi ovale. 2. Multiple remote nonhemorrhagic lacunar infarcts involving the basal ganglia and centrum semi ovale bilaterally. 3. Remote lacunar infarcts of the thalami and brainstem. 4. Remote left PICA territory infarct. 5. Advanced atrophy and white matter disease, consistent with chronic microvascular ischemia. 6. 10 mm posterior spinal mass at the C4-5 level may create mass effect on the cervical spinal cord. Recommend MRI of the cervical spine without and with contrast. The patient can receive half dose contrast due to renal insufficiency. 7. Decreased marrow signal in the upper cervical spine. This is likely related to kidney disease. 8. Moderate stenosis of the proximal left vertebral artery does not appear to be flow limiting. 9. Mild distal small vessel disease without other significant proximal stenosis, aneurysm, or branch vessel occlusion within the circle-of-Willis. Electronically Signed   By: San Morelle M.D.   On: 11/15/2018 05:43   Ct Head Code Stroke Wo Contrast  Result Date: 11/15/2018 CLINICAL DATA:  Code stroke. 63 year old male with left facial droop. EXAM: CT HEAD WITHOUT CONTRAST TECHNIQUE: Contiguous axial images were obtained from the base of the skull through the vertex without intravenous contrast. COMPARISON:  Brain MRI, CTA head and neck 12/04/2017. FINDINGS: Brain: Chronic infarcts with encephalomalacia in the left cerebellum, right occipital lobe. Widespread Patchy and confluent cerebral white matter and bilateral deep gray matter hypodensity. Stable gray-white matter differentiation throughout the brain since 2019. No midline shift, ventriculomegaly, mass effect, evidence of mass lesion, intracranial hemorrhage or evidence of cortically based acute infarction. Vascular: Calcified atherosclerosis at the skull base. No suspicious intracranial vascular hyperdensity. Skull: No acute osseous abnormality identified. Sinuses/Orbits:  Visualized paranasal sinuses and mastoids are stable and well pneumatized. Other: No acute orbit or scalp soft tissue findings. ASPECTS [facility] Stroke Program Early CT Score) - Ganglionic level infarction (caudate, lentiform nuclei, internal capsule, insula, M1-M3 cortex): 7 - Supraganglionic infarction (M4-M6 cortex): 3 Total score (0-10 with 10 being normal): 10 (chronic encephalomalacia) IMPRESSION: 1. Extensive chronic ischemic disease appears stable by CT since 2019. 2. No acute intracranial hemorrhage or cortically based infarct identified. 3. ASPECTS is 10. 4. These results were communicated to Dr. Cheral Marker at 3:14 am on 11/15/2018 by text page via the [facility] messaging system. Electronically Signed   By: Genevie Ann M.D.   On: 11/15/2018 03:14    ____________________________________________   PROCEDURES  Procedure(s) performed:   .Critical Care Performed by: Merrily Pew, MD Authorized by: Merrily Pew, MD   Critical care provider statement:    Critical care time (minutes):  45   Critical care was necessary to treat or prevent imminent or life-threatening deterioration of the following conditions:  CNS failure or compromise   Critical care was time spent personally by me on the following activities:  Discussions with consultants, evaluation of patient's response to treatment, examination of patient, ordering and performing treatments and interventions, ordering and review of laboratory studies, ordering and review of radiographic studies, pulse oximetry, re-evaluation of patient's condition, obtaining history from patient or surrogate and review of old charts     ____________________________________________   INITIAL IMPRESSION / [place] / ED COURSE  Code stroke for left sided symptoms. Taken immediately to CT by EMS.  Further history taken apparently the facility states that he had a low blood pressure and was dizziness really the reason I called in the first place.  No  low blood pressures with EMS and no low blood pressures with [place].  Doubt shock at this time.  MRI w/ e/o stroke. Admit to unassigned.      Pertinent labs & imaging results that were available during my care of the patient were reviewed by me and considered in my medical decision making (see chart for details).   ____________________________________________  FINAL CLINICAL IMPRESSION(S) / ED DIAGNOSES  Final diagnoses:  Cerebral infarction, unspecified mechanism ([place])     MEDICATIONS GIVEN DURING THIS VISIT:  Medications  sodium chloride flush (NS) 0.9 % injection 3 mL (3 mLs Intravenous Not Given 11/15/18 0326)  lactated ringers bolus 500 mL (0 mLs Intravenous Stopped 11/15/18 0529)     NEW OUTPATIENT MEDICATIONS STARTED DURING THIS VISIT:  New Prescriptions   No medications on file    Note:  This note was prepared with assistance of Dragon voice recognition software. Occasional wrong-word or sound-a-like substitutions may have occurred due to the inherent limitations of voice recognition software.   Ossie Beltran, Corene Cornea, MD 11/15/18 [phone]

## 2018-11-15 NOTE — Progress Notes (Signed)
Pt transported off unit to MRI. P. Amo Tyrone Balash RN 

## 2018-11-15 NOTE — Progress Notes (Signed)
SLP Cancellation Note  Patient Details Name: Epic Tribbett MRN: [id number] DOB: 12-20-1955   Cancelled treatment:       Reason Eval/Treat Not Completed: Other (comment) Order received for swallow evaluation, but per chart review pt has already passed the [facility] swallow screen and he is on a PO diet. RN says he has been eating and drinking without overt difficulty. SLP will defer swallow evaluation for now, but will f/u as able for cognitive evaluation.    Venita Sheffield Vaniah Chambers 11/15/2018, 3:50 PM  Pollyann Glen, M.A. [place] Acute Environmental education officer [phone] Office [phone]

## 2018-11-15 NOTE — ED Triage Notes (Signed)
BIB [facility] from [facility] with c/o of left sided weakness, facial droop, and sensory deficits. Pt walking in hall alone when he slumped over to left side. Pt also  found diaphoretic, clammy. CBG reported in 200s from facility. 106 CBG on arrival.

## 2018-11-15 NOTE — Progress Notes (Signed)
Pt back to unit from MRI. VSS. Will continue to closely monitor. Delia Heady RN

## 2018-11-16 ENCOUNTER — Encounter (HOSPITAL_COMMUNITY): Payer: Self-pay | Admitting: General Practice

## 2018-11-16 ENCOUNTER — Inpatient Hospital Stay (HOSPITAL_COMMUNITY): Payer: Medicare Other

## 2018-11-16 DIAGNOSIS — I639 Cerebral infarction, unspecified: Secondary | ICD-10-CM

## 2018-11-16 LAB — BASIC METABOLIC PANEL
Anion gap: 7 (ref 5–15)
BUN: 23 mg/dL (ref 8–23)
CO2: 25 mmol/L (ref 22–32)
Calcium: 9.3 mg/dL (ref 8.9–10.3)
Chloride: 108 mmol/L (ref 98–111)
Creatinine, Ser: 1.5 mg/dL — ABNORMAL HIGH (ref 0.61–1.24)
GFR calc Af Amer: 56 mL/min — ABNORMAL LOW (ref >60)
GFR calc non Af Amer: 48 mL/min — ABNORMAL LOW (ref >60)
Glucose, Bld: 96 mg/dL (ref 70–99)
Potassium: 3.6 mmol/L (ref 3.5–5.1)
Sodium: 140 mmol/L (ref 135–145)

## 2018-11-16 LAB — HEMOGLOBIN A1C
Hgb A1c MFr Bld: 5.2 % (ref 4.8–5.6)
Mean Plasma Glucose: 102.54 mg/dL

## 2018-11-16 LAB — CBC
HCT: 24 % — ABNORMAL LOW (ref 39.0–52.0)
HCT: 25.6 % — ABNORMAL LOW (ref 39.0–52.0)
Hemoglobin: 7.8 g/dL — ABNORMAL LOW (ref 13.0–17.0)
Hemoglobin: 8.1 g/dL — ABNORMAL LOW (ref 13.0–17.0)
MCH: 29.9 pg (ref 26.0–34.0)
MCH: 29.5 pg (ref 26.0–34.0)
MCHC: 32.5 g/dL (ref 30.0–36.0)
MCHC: 31.6 g/dL (ref 30.0–36.0)
MCV: 92 fL (ref 80.0–100.0)
MCV: 93.1 fL (ref 80.0–100.0)
Platelets: 289 10*3/uL (ref 150–400)
Platelets: 294 10*3/uL (ref 150–400)
RBC: 2.61 MIL/uL — ABNORMAL LOW (ref 4.22–5.81)
RBC: 2.75 MIL/uL — ABNORMAL LOW (ref 4.22–5.81)
RDW: 14.7 % (ref 11.5–15.5)
RDW: 14.9 % (ref 11.5–15.5)
WBC: 5 10*3/uL (ref 4.0–10.5)
WBC: 4 10*3/uL (ref 4.0–10.5)
nRBC: 0 % (ref 0.0–0.2)
nRBC: 0 % (ref 0.0–0.2)

## 2018-11-16 LAB — FOLATE: Folate: 13.5 ng/mL (ref >5.9)

## 2018-11-16 LAB — ECHOCARDIOGRAM LIMITED BUBBLE STUDY
Height: 72 in
Weight: 2599.66 oz

## 2018-11-16 LAB — IRON AND TIBC
Iron: 242 ug/dL — ABNORMAL HIGH (ref 45–182)
Saturation Ratios: 72 % — ABNORMAL HIGH (ref 17.9–39.5)
TIBC: 336 ug/dL (ref 250–450)
UIBC: 94 ug/dL

## 2018-11-16 LAB — RETICULOCYTES
Immature Retic Fract: 29.8 % — ABNORMAL HIGH (ref 2.3–15.9)
RBC.: 2.75 MIL/uL — ABNORMAL LOW (ref 4.22–5.81)
Retic Count, Absolute: 103.1 10*3/uL (ref 19.0–186.0)
Retic Ct Pct: 3.8 % — ABNORMAL HIGH (ref 0.4–3.1)

## 2018-11-16 LAB — LIPID PANEL
Cholesterol: 95 mg/dL (ref 0–200)
HDL: 46 mg/dL (ref >40)
LDL Cholesterol: 27 mg/dL (ref 0–99)
Total CHOL/HDL Ratio: 2.1 RATIO
Triglycerides: 111 mg/dL (ref <150)
VLDL: 22 mg/dL (ref 0–40)

## 2018-11-16 LAB — VITAMIN B12: Vitamin B-12: 314 pg/mL (ref 180–914)

## 2018-11-16 LAB — FERRITIN: Ferritin: 10 ng/mL — ABNORMAL LOW (ref 24–336)

## 2018-11-16 MED ORDER — PNEUMOCOCCAL VAC POLYVALENT 25 MCG/0.5ML IJ INJ
0.5000 mL | INJECTION | INTRAMUSCULAR | Status: AC
  Administered 2018-11-19: 10:00:00 0.5 mL via INTRAMUSCULAR
  Filled 2018-11-16: qty 0.5

## 2018-11-16 MED ORDER — FINASTERIDE 5 MG PO TABS
5.0000 mg | ORAL_TABLET | Freq: Every day | ORAL | Status: DC
  Administered 2018-11-16 – 2018-11-19 (×4): 5 mg via ORAL
  Filled 2018-11-16 (×4): qty 1

## 2018-11-16 NOTE — Evaluation (Signed)
Physical Therapy Evaluation Patient Details Name: Karl Miller MRN: [id number] DOB: Jun 08, 1956 Today's Date: 11/16/2018   History of Present Illness  Pt is a 63 y/o male presenting with L facial droop.  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 PMH: dementia, (from ALF), HTN, CVA.   Clinical Impression  Pt admitted secondary to problem above with deficits below. Pt presenting with cognitive deficits, however, unsure of baseline. Pt requiring min A for steadying throughout mobility tasks, and had tendency to lean posteriorly when performing standing tasks. Pt reports he is from home, however, per chart from ALF. OT reached out to CSW to determine previous living environment. If 24/7 available at ALF, feel pt would benefit from [place]. However, if 24/7 not available, feel pt will require SNF. Will continue to follow acutely to maximize functional mobility independence and safety.     Follow Up Recommendations Home health PT;Supervision/Assistance - 24 hour(if no 24/7, will require SNF )    Equipment Recommendations  None recommended by PT    Recommendations for Other Services       Precautions / Restrictions Precautions Precautions: Fall Restrictions Weight Bearing Restrictions: No      Mobility  Bed Mobility Overal bed mobility: Needs Assistance Bed Mobility: Supine to Sit     Supine to sit: Supervision     General bed mobility comments: supervision for safety   Transfers Overall transfer level: Needs assistance Equipment used: Rolling walker (2 wheeled) Transfers: Sit to/from Stand Sit to Stand: Min assist         General transfer comment: min assist for safety and balance, attempted redirection of hands but pt pulling on walker to stand up before able to redirect. Pt initially with posterior lean in standing, however, able to correct with multmodal cues.    Ambulation/Gait Ambulation/Gait assistance: Min assist;Min guard Gait Distance (Feet): 20 Feet Assistive device: Rolling walker (2 wheeled) Gait Pattern/deviations: Step-through pattern;Decreased stride length Gait velocity: Decreased    General Gait Details: Slow, unsteady gait. Decreased coordination noted when taking steps (RLE>LLE). Required cues for safe use of RW.   Stairs            Wheelchair Mobility    Modified Rankin (Stroke Patients Only)       Balance Overall balance assessment: Needs assistance Sitting-balance support: No upper extremity supported;Feet supported Sitting balance-Leahy Scale: Fair     Standing balance support: No upper extremity supported;During functional activity;Bilateral upper extremity supported Standing balance-Leahy Scale: Poor Standing balance comment: posterior lean at times during grooming tasks with 0 hand support, preference to UE support                             Pertinent Vitals/Pain Pain Assessment: No/denies pain    Home Living Family/patient expects to be discharged to:: Private residence Living Arrangements: Parent Available Help at Discharge: Family;Available PRN/intermittently Type of Home: House Home Access: Stairs to enter   [employer] of Steps: several Home Layout: One level Home Equipment: Walker - 2 wheels Additional Comments: above report from pt, poor historian with chart review revealing from ALF     Prior Function Level of Independence: Needs assistance   Gait / Transfers Assistance Needed: reports using RW for mobility with assist   ADL's / Homemaking Assistance Needed: reports some assist with ADLs   Comments: poor historian, unclear accuracy  Hand Dominance   Dominant Hand: Right    Extremity/Trunk Assessment   Upper Extremity Assessment Upper Extremity Assessment: Defer to OT evaluation    Lower Extremity Assessment Lower Extremity Assessment: LLE  deficits/detail;RLE deficits/detail RLE Deficits / Details: Grossly 4/5. Decreased coordination noted when taking steps.  LLE Deficits / Details: Grossly 4/5 throughout. Pt reporting decreased sensation in RLE.  LLE Sensation: decreased light touch    Cervical / Trunk Assessment Cervical / Trunk Assessment: Kyphotic  Communication   Communication: No difficulties  Cognition Arousal/Alertness: Awake/alert Behavior During Therapy: WFL for tasks assessed/performed Overall Cognitive Status: History of cognitive impairments - at baseline                                 General Comments: pt able to follow 1 step commands inconsistently, oriented to person, place and situation but not time; poor awareness to deficits and safety; noted hx of dementia      General Comments      Exercises     Assessment/Plan    PT Assessment Patient needs continued PT services  PT Problem List Decreased strength;Decreased balance;Decreased mobility;Decreased knowledge of use of DME;Decreased knowledge of precautions;Decreased safety awareness;Decreased coordination;Decreased cognition       PT Treatment Interventions DME instruction;Gait training;Functional mobility training;Therapeutic exercise;Therapeutic activities;Balance training;Neuromuscular re-education;Cognitive remediation;Patient/family education    PT Goals (Current goals can be found in the Care Plan section)  Acute Rehab PT Goals Patient Stated Goal: none stated PT Goal Formulation: Patient unable to participate in goal setting Time For Goal Achievement: 11/30/18 Potential to Achieve Goals: Good    Frequency Min 3X/week   Barriers to discharge Other (comment) Unsure of caregiver support     Co-evaluation PT/OT/SLP Co-Evaluation/Treatment: Yes Reason for Co-Treatment: For patient/therapist safety;To address functional/ADL transfers;Necessary to address cognition/behavior during functional activity PT goals addressed  during session: Mobility/safety with mobility;Balance;Proper use of DME         AM-PAC PT "6 Clicks" Mobility  Outcome Measure Help needed turning from your back to your side while in a flat bed without using bedrails?: A Little Help needed moving from lying on your back to sitting on the side of a flat bed without using bedrails?: A Little Help needed moving to and from a bed to a chair (including a wheelchair)?: A Little Help needed standing up from a chair using your arms (e.g., wheelchair or bedside chair)?: A Little Help needed to walk in hospital room?: A Little Help needed climbing 3-5 steps with a railing? : A Lot 6 Click Score: 17    End of Session Equipment Utilized During Treatment: Gait belt Activity Tolerance: Patient tolerated treatment well Patient left: in chair;with call bell/phone within reach;with chair alarm set Nurse Communication: Mobility status PT Visit Diagnosis: Unsteadiness on feet (R26.81);Muscle weakness (generalized) (M62.81)    Time: 5631-4970 PT Time Calculation (min) (ACUTE ONLY): 18 min   Charges:   PT Evaluation $PT Eval Moderate Complexity: [place], PT, DPT  Acute Rehabilitation Services  Pager: [phone] Office: [phone]   Rudean Hitt 11/16/2018, 3:57 PM

## 2018-11-16 NOTE — Progress Notes (Signed)
SLP Cancellation Note  Patient Details Name: Karl Miller MRN: [id number] DOB: 11/09/1955   Cancelled treatment:       Reason Eval/Treat Not Completed: Patient at procedure or test/unavailable. SLP will follow up and re-attempt as able.   Shareta Fishbaugh I. Hardin Negus, [place], [place] Office number [phone] Pager [place] 11/16/2018, 9:32 AM

## 2018-11-16 NOTE — Progress Notes (Signed)
Subjective:   The patient is mildly somnolent but easily arousable.  He is little more confused this morning.  Objective: Vital signs in last 24 hours: Temp:  [97.5 F (36.4 C)-98.7 F (37.1 C)] 98.3 F (36.8 C) (04/28 0730) Pulse Rate:  [63-77] 77 (04/28 0730) Resp:  [16-18] 18 (04/28 0730) BP: (119-141)/(75-97) 129/97 (04/28 0730) SpO2:  [90 %-100 %] 98 % (04/28 0730) Weight:  [73.7 kg-75.1 kg] 73.7 kg (04/28 0500) Estimated body mass index is 22.04 kg/m as calculated from the following:   Height as of this encounter: 6' (1.829 m).   Weight as of this encounter: 73.7 kg.   Intake/Output from previous day: 04/27 0701 - 04/28 0700 In: 995.8 [P.O.:700; I.V.:295.8] Out: -  Intake/Output this shift: No intake/output data recorded.  Physical exam   The patient is  Mildly somnolent but easily arousable.  He will answer simple questions.  He is oriented x1.  He has a left facial droop.  He is moving all 4 extremities well.  Lab Results: Recent Labs   11/15/18 0256 11/15/18 0302 11/16/18 0417 WBC 5.5  --  5.0 HGB 8.3* 8.5* 7.8* HCT 26.5* 25.0* 24.0* PLT 343  --  289  BMET Recent Labs   11/15/18 0256 11/15/18 0302 11/16/18 0417 NA 140 143 140 K 3.7 3.9 3.6 CL 104  --  108 CO2 28  --  25 GLUCOSE 122*  --  96 BUN 27*  --  23 CREATININE 1.75*  --  1.50* CALCIUM 9.4  --  9.3   Studies/Results: Mr [facility] Contrast  Result Date: 11/15/2018 CLINICAL DATA:  Muscle weakness, generalized.  Fall. EXAM: MRI HEAD WITHOUT CONTRAST MRA HEAD WITHOUT CONTRAST TECHNIQUE: Multiplanar, multiecho pulse sequences of the brain and surrounding structures were obtained without intravenous contrast. Angiographic images of the head were obtained using MRA technique without contrast. COMPARISON:  CT head without contrast 11/15/2018. MRI brain 12/04/2017 FINDINGS: MRI HEAD FINDINGS Brain: Acute nonhemorrhagic infarct involves the right centrum semi ovale. Patient has multiple bilateral  remote lacunar infarcts in a similar location. Remote lacunar infarcts also involves the basal ganglia bilaterally. There is no hemorrhage. Remote lacunar infarcts of the thalami bilaterally are stable. A remote lacunar infarct is again noted in the pons. A remote left PICA territory infarct is stable. Moderate diffuse white matter disease is otherwise stable bilaterally. Advanced atrophy is noted. Vascular: Flow is present in the major intracranial arteries. Skull and upper cervical spine: Craniocervical junction is normal. There is decreased marrow signal in the upper cervical spine. This is new since the prior study. A posterior spinal mass is present at C4-5. This appears to have mass effect on the posterior aspect of the spinal cord. No other focal lesions are present. The Sinuses/Orbits: Mild mucosal thickening is present inferiorly in the left maxillary sinus. The paranasal sinuses and mastoid air cells are otherwise clear. The globes and orbits are within normal limits. MRA HEAD FINDINGS The internal carotid arteries are within normal limits from the high cervical segments through the ICA terminus bilaterally. The A1 and M1 segments are normal. No definite anterior communicating artery is present. MCA bifurcations are within normal limits. The ACA and MCA branch vessels are within normal limits. There is a moderate focal stenosis of the dominant left vertebral artery at the dural margin. The left PICA is just above this level. Right PICA is within normal limits. Vertebrobasilar junction is normal. Both posterior cerebral arteries originate from basilar tip. There is some attenuation  of distal PCA branch vessels. IMPRESSION: 1. Acute nonhemorrhagic lacunar infarct involving the right basal ganglia and centrum semi ovale. 2. Multiple remote nonhemorrhagic lacunar infarcts involving the basal ganglia and centrum semi ovale bilaterally. 3. Remote lacunar infarcts of the thalami and brainstem. 4. Remote left PICA  territory infarct. 5. Advanced atrophy and white matter disease, consistent with chronic microvascular ischemia. 6. 10 mm posterior spinal mass at the C4-5 level may create mass effect on the cervical spinal cord. Recommend MRI of the cervical spine without and with contrast. The patient can receive half dose contrast due to renal insufficiency. 7. Decreased marrow signal in the upper cervical spine. This is likely related to kidney disease. 8. Moderate stenosis of the proximal left vertebral artery does not appear to be flow limiting. 9. Mild distal small vessel disease without other significant proximal stenosis, aneurysm, or branch vessel occlusion within the circle-of-Willis. Electronically Signed   By: San Morelle M.D.   On: 11/15/2018 05:43   Mr Brain Wo Contrast  Result Date: 11/15/2018 CLINICAL DATA:  Muscle weakness, generalized.  Fall. EXAM: MRI HEAD WITHOUT CONTRAST MRA HEAD WITHOUT CONTRAST TECHNIQUE: Multiplanar, multiecho pulse sequences of the brain and surrounding structures were obtained without intravenous contrast. Angiographic images of the head were obtained using MRA technique without contrast. COMPARISON:  CT head without contrast 11/15/2018. MRI brain 12/04/2017 FINDINGS: MRI HEAD FINDINGS Brain: Acute nonhemorrhagic infarct involves the right centrum semi ovale. Patient has multiple bilateral remote lacunar infarcts in a similar location. Remote lacunar infarcts also involves the basal ganglia bilaterally. There is no hemorrhage. Remote lacunar infarcts of the thalami bilaterally are stable. A remote lacunar infarct is again noted in the pons. A remote left PICA territory infarct is stable. Moderate diffuse white matter disease is otherwise stable bilaterally. Advanced atrophy is noted. Vascular: Flow is present in the major intracranial arteries. Skull and upper cervical spine: Craniocervical junction is normal. There is decreased marrow signal in the upper cervical spine. This  is new since the prior study. A posterior spinal mass is present at C4-5. This appears to have mass effect on the posterior aspect of the spinal cord. No other focal lesions are present. The Sinuses/Orbits: Mild mucosal thickening is present inferiorly in the left maxillary sinus. The paranasal sinuses and mastoid air cells are otherwise clear. The globes and orbits are within normal limits. MRA HEAD FINDINGS The internal carotid arteries are within normal limits from the high cervical segments through the ICA terminus bilaterally. The A1 and M1 segments are normal. No definite anterior communicating artery is present. MCA bifurcations are within normal limits. The ACA and MCA branch vessels are within normal limits. There is a moderate focal stenosis of the dominant left vertebral artery at the dural margin. The left PICA is just above this level. Right PICA is within normal limits. Vertebrobasilar junction is normal. Both posterior cerebral arteries originate from basilar tip. There is some attenuation of distal PCA branch vessels. IMPRESSION: 1. Acute nonhemorrhagic lacunar infarct involving the right basal ganglia and centrum semi ovale. 2. Multiple remote nonhemorrhagic lacunar infarcts involving the basal ganglia and centrum semi ovale bilaterally. 3. Remote lacunar infarcts of the thalami and brainstem. 4. Remote left PICA territory infarct. 5. Advanced atrophy and white matter disease, consistent with chronic microvascular ischemia. 6. 10 mm posterior spinal mass at the C4-5 level may create mass effect on the cervical spinal cord. Recommend MRI of the cervical spine without and with contrast. The patient can receive half dose  contrast due to renal insufficiency. 7. Decreased marrow signal in the upper cervical spine. This is likely related to kidney disease. 8. Moderate stenosis of the proximal left vertebral artery does not appear to be flow limiting. 9. Mild distal small vessel disease without other  significant proximal stenosis, aneurysm, or branch vessel occlusion within the circle-of-Willis. Electronically Signed   By: San Morelle M.D.   On: 11/15/2018 05:43   Mr Cervical Spine W Wo Contrast  Result Date: 11/15/2018 CLINICAL DATA:  Follow-up to abnormality seen on brain MRI. Generalized muscle weakness with fall. EXAM: MRI CERVICAL SPINE WITHOUT AND WITH CONTRAST TECHNIQUE: Multiplanar and multiecho pulse sequences of the cervical spine, to include the craniocervical junction and cervicothoracic junction, were obtained without and with intravenous contrast. CONTRAST:  Gadavist 10 mL. COMPARISON:  MR brain earlier in the day.  CTA head neck 12/04/2017. FINDINGS: Significant motion degradation. The study is of borderline diagnostic utility. Alignment: Physiologic. Vertebrae: No fracture, evidence of discitis, or bone lesion. 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 The lesion measures roughly 8 x 8 x 12 mm. The mass demonstrates intermediate T1, decreased T2, and increased STIR intensity. There is avid homogeneous postcontrast enhancement. Abnormal cord signal is difficult to confirm or exclude. There is significant spinal stenosis and displacement of the spinal cord RIGHT-to-LEFT. Differential considerations include meningioma versus schwannoma. Posterior Fossa, vertebral arteries, paraspinal tissues: Posterior fossa unremarkable. Vertebral flow voids are maintained. No definite neck masses. Disc levels: Assessment of individual levels or disc pathology is limited. Disc space narrowing is most pronounced at the C6-7 level, where there appears to be a shallow protrusion. Compared with the previous CT study from May 2019, the mass was present but difficult to see. 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 Differential considerations include meningioma versus schwannoma. Neurosurgical consultation is warranted. Motion degradation makes assessment of cord edema problematic. Multilevel spondylosis appears most prominent at C6-C7. Electronically Signed   By: Staci Righter M.D.   On: 11/15/2018 12:56   Ct Head Code Stroke Wo Contrast  Result Date: 11/15/2018 CLINICAL DATA:  Code stroke. 63 year old male with left facial droop. EXAM: CT HEAD WITHOUT CONTRAST TECHNIQUE: Contiguous axial images were obtained from the base of the skull through the vertex without intravenous contrast. COMPARISON:  Brain MRI, CTA head and neck 12/04/2017. FINDINGS: Brain: Chronic infarcts with encephalomalacia in the left cerebellum, right occipital lobe. Widespread Patchy and confluent cerebral white matter and bilateral deep gray matter hypodensity. Stable gray-white matter differentiation throughout the brain since 2019. No midline shift, ventriculomegaly, mass effect, evidence of mass lesion, intracranial hemorrhage or evidence of cortically based acute infarction. Vascular: Calcified atherosclerosis at the skull base. No suspicious intracranial vascular hyperdensity. Skull: No acute osseous abnormality identified. Sinuses/Orbits: Visualized paranasal sinuses and mastoids are stable and well pneumatized. Other: No acute orbit or scalp soft tissue findings. ASPECTS [facility] Stroke Program Early CT Score) - Ganglionic level infarction (caudate, lentiform nuclei, internal capsule, insula, M1-M3 cortex): 7 - Supraganglionic infarction (M4-M6 cortex): 3 Total score (0-10 with 10 being normal): 10 (chronic encephalomalacia) IMPRESSION: 1. Extensive chronic ischemic disease appears stable by CT since 2019. 2. No acute intracranial hemorrhage or cortically based infarct identified. 3. ASPECTS is 10. 4. These results were communicated to Dr. Cheral Marker at 3:14 am on 11/15/2018 by text page via the [facility]  messaging system.  Electronically Signed   By: Genevie Ann M.D.   On: 11/15/2018 03:14    Assessment/Plan: Right lacunar acute infarct:  He is stable clinically.    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  I am not convinced that he is an appropriate surgical candidate. Please call if I can be of further assistance.  LOS: 1 day     Ophelia Charter 11/16/2018, 8:13 AM

## 2018-11-16 NOTE — Progress Notes (Signed)
STROKE TEAM PROGRESS NOTE   INTERVAL HISTORY Patient is sitting comfortably in a bedside chair.  He had echo this morning but results are pending.  He has no complaints.  Vitals:   11/16/18 0332 11/16/18 0500 11/16/18 0730 11/16/18 1231  BP: 119/82  (!) 129/97 (!) 142/92  Pulse: 72  77 89  Resp: 17  18 18   Temp: 98.7 F (37.1 C)  98.3 F (36.8 C) 98.6 F (37 C)  TempSrc: Oral  Oral Oral  SpO2: 99%  98% 96%  Weight:  73.7 kg    Height:        CBC:  Recent Labs  Lab 11/15/18 0256  11/16/18 0417 11/16/18 1157  WBC 5.5  --  5.0 4.0  NEUTROABS 3.8  --   --   --   HGB 8.3*   < > 7.8* 8.1*  HCT 26.5*   < > 24.0* 25.6*  MCV 94.0  --  92.0 93.1  PLT 343  --  289 294   < > = values in this interval not displayed.    Basic Metabolic Panel:  Recent Labs  Lab 11/15/18 0256 11/15/18 0302 11/16/18 0417  NA 140 143 140  K 3.7 3.9 3.6  CL 104  --  108  CO2 28  --  25  GLUCOSE 122*  --  96  BUN 27*  --  23  CREATININE 1.75*  --  1.50*  CALCIUM 9.4  --  9.3   Lipid Panel:     Component Value Date/Time   CHOL 95 11/16/2018 0417   TRIG 111 11/16/2018 0417   HDL 46 11/16/2018 0417   CHOLHDL 2.1 11/16/2018 0417   VLDL 22 11/16/2018 0417   LDLCALC 27 11/16/2018 0417   HgbA1c:  Lab Results  Component Value Date   HGBA1C 5.2 11/16/2018   Urine Drug Screen:     Component Value Date/Time   LABOPIA NONE DETECTED 12/04/2017 1102   COCAINSCRNUR NONE DETECTED 12/04/2017 1102   LABBENZ NONE DETECTED 12/04/2017 1102   AMPHETMU NONE DETECTED 12/04/2017 1102   THCU NONE DETECTED 12/04/2017 1102   LABBARB NONE DETECTED 12/04/2017 1102    Alcohol Level     Component Value Date/Time   [facility] <10 12/04/2017 0854    IMAGING [name] Head Wo Contrast  Result Date: 11/15/2018 CLINICAL DATA:  Muscle weakness, generalized.  Fall. EXAM: MRI HEAD WITHOUT CONTRAST MRA HEAD WITHOUT CONTRAST TECHNIQUE: Multiplanar, multiecho pulse sequences of the brain and surrounding structures were  obtained without intravenous contrast. Angiographic images of the head were obtained using MRA technique without contrast. COMPARISON:  CT head without contrast 11/15/2018. MRI brain 12/04/2017 FINDINGS: MRI HEAD FINDINGS Brain: Acute nonhemorrhagic infarct involves the right centrum semi ovale. Patient has multiple bilateral remote lacunar infarcts in a similar location. Remote lacunar infarcts also involves the basal ganglia bilaterally. There is no hemorrhage. Remote lacunar infarcts of the thalami bilaterally are stable. A remote lacunar infarct is again noted in the pons. A remote left PICA territory infarct is stable. Moderate diffuse white matter disease is otherwise stable bilaterally. Advanced atrophy is noted. Vascular: Flow is present in the major intracranial arteries. Skull and upper cervical spine: Craniocervical junction is normal. There is decreased marrow signal in the upper cervical spine. This is new since the prior study. A posterior spinal mass is present at C4-5. This appears to have mass effect on the posterior aspect of the spinal cord. No other focal lesions are present. The Sinuses/Orbits: Mild  mucosal thickening is present inferiorly in the left maxillary sinus. The paranasal sinuses and mastoid air cells are otherwise clear. The globes and orbits are within normal limits. MRA HEAD FINDINGS The internal carotid arteries are within normal limits from the high cervical segments through the ICA terminus bilaterally. The A1 and M1 segments are normal. No definite anterior communicating artery is present. MCA bifurcations are within normal limits. The ACA and MCA branch vessels are within normal limits. There is a moderate focal stenosis of the dominant left vertebral artery at the dural margin. The left PICA is just above this level. Right PICA is within normal limits. Vertebrobasilar junction is normal. Both posterior cerebral arteries originate from basilar tip. There is some attenuation of  distal PCA branch vessels. IMPRESSION: 1. Acute nonhemorrhagic lacunar infarct involving the right basal ganglia and centrum semi ovale. 2. Multiple remote nonhemorrhagic lacunar infarcts involving the basal ganglia and centrum semi ovale bilaterally. 3. Remote lacunar infarcts of the thalami and brainstem. 4. Remote left PICA territory infarct. 5. Advanced atrophy and white matter disease, consistent with chronic microvascular ischemia. 6. 10 mm posterior spinal mass at the C4-5 level may create mass effect on the cervical spinal cord. Recommend MRI of the cervical spine without and with contrast. The patient can receive half dose contrast due to renal insufficiency. 7. Decreased marrow signal in the upper cervical spine. This is likely related to kidney disease. 8. Moderate stenosis of the proximal left vertebral artery does not appear to be flow limiting. 9. Mild distal small vessel disease without other significant proximal stenosis, aneurysm, or branch vessel occlusion within the circle-of-Willis. Electronically Signed   By: San Morelle M.D.   On: 11/15/2018 05:43   Mr Brain Wo Contrast  Result Date: 11/15/2018 CLINICAL DATA:  Muscle weakness, generalized.  Fall. EXAM: MRI HEAD WITHOUT CONTRAST MRA HEAD WITHOUT CONTRAST TECHNIQUE: Multiplanar, multiecho pulse sequences of the brain and surrounding structures were obtained without intravenous contrast. Angiographic images of the head were obtained using MRA technique without contrast. COMPARISON:  CT head without contrast 11/15/2018. MRI brain 12/04/2017 FINDINGS: MRI HEAD FINDINGS Brain: Acute nonhemorrhagic infarct involves the right centrum semi ovale. Patient has multiple bilateral remote lacunar infarcts in a similar location. Remote lacunar infarcts also involves the basal ganglia bilaterally. There is no hemorrhage. Remote lacunar infarcts of the thalami bilaterally are stable. A remote lacunar infarct is again noted in the pons. A remote  left PICA territory infarct is stable. Moderate diffuse white matter disease is otherwise stable bilaterally. Advanced atrophy is noted. Vascular: Flow is present in the major intracranial arteries. Skull and upper cervical spine: Craniocervical junction is normal. There is decreased marrow signal in the upper cervical spine. This is new since the prior study. A posterior spinal mass is present at C4-5. This appears to have mass effect on the posterior aspect of the spinal cord. No other focal lesions are present. The Sinuses/Orbits: Mild mucosal thickening is present inferiorly in the left maxillary sinus. The paranasal sinuses and mastoid air cells are otherwise clear. The globes and orbits are within normal limits. MRA HEAD FINDINGS The internal carotid arteries are within normal limits from the high cervical segments through the ICA terminus bilaterally. The A1 and M1 segments are normal. No definite anterior communicating artery is present. MCA bifurcations are within normal limits. The ACA and MCA branch vessels are within normal limits. There is a moderate focal stenosis of the dominant left vertebral artery at the dural margin. The left  PICA is just above this level. Right PICA is within normal limits. Vertebrobasilar junction is normal. Both posterior cerebral arteries originate from basilar tip. There is some attenuation of distal PCA branch vessels. IMPRESSION: 1. Acute nonhemorrhagic lacunar infarct involving the right basal ganglia and centrum semi ovale. 2. Multiple remote nonhemorrhagic lacunar infarcts involving the basal ganglia and centrum semi ovale bilaterally. 3. Remote lacunar infarcts of the thalami and brainstem. 4. Remote left PICA territory infarct. 5. Advanced atrophy and white matter disease, consistent with chronic microvascular ischemia. 6. 10 mm posterior spinal mass at the C4-5 level may create mass effect on the cervical spinal cord. Recommend MRI of the cervical spine without and  with contrast. The patient can receive half dose contrast due to renal insufficiency. 7. Decreased marrow signal in the upper cervical spine. This is likely related to kidney disease. 8. Moderate stenosis of the proximal left vertebral artery does not appear to be flow limiting. 9. Mild distal small vessel disease without other significant proximal stenosis, aneurysm, or branch vessel occlusion within the circle-of-Willis. Electronically Signed   By: San Morelle M.D.   On: 11/15/2018 05:43   Mr Cervical Spine W Wo Contrast  Result Date: 11/15/2018 CLINICAL DATA:  Follow-up to abnormality seen on brain MRI. Generalized muscle weakness with fall. EXAM: MRI CERVICAL SPINE WITHOUT AND WITH CONTRAST TECHNIQUE: Multiplanar and multiecho pulse sequences of the cervical spine, to include the craniocervical junction and cervicothoracic junction, were obtained without and with intravenous contrast. CONTRAST:  Gadavist 10 mL. COMPARISON:  MR brain earlier in the day.  CTA head neck 12/04/2017. FINDINGS: Significant motion degradation. The study is of borderline diagnostic utility. Alignment: Physiologic. Vertebrae: No fracture, evidence of discitis, or bone lesion. 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 The lesion measures roughly 8 x 8 x 12 mm. The mass demonstrates intermediate T1, decreased T2, and increased STIR intensity. There is avid homogeneous postcontrast enhancement. Abnormal cord signal is difficult to confirm or exclude. There is significant spinal stenosis and displacement of the spinal cord RIGHT-to-LEFT. Differential considerations include meningioma versus schwannoma. Posterior Fossa, vertebral arteries, paraspinal tissues: Posterior fossa unremarkable. Vertebral flow voids are maintained. No definite neck masses. Disc levels: Assessment of individual levels or disc pathology is limited. Disc space narrowing is most  pronounced at the C6-7 level, where there appears to be a shallow protrusion. Compared with the previous CT study from May 2019, the mass was present but difficult to see. 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 Differential considerations include meningioma versus schwannoma. Neurosurgical consultation is warranted. Motion degradation makes assessment of cord edema problematic. Multilevel spondylosis appears most prominent at C6-C7. Electronically Signed   By: Staci Righter M.D.   On: 11/15/2018 12:56   Ct Head Code Stroke Wo Contrast  Result Date: 11/15/2018 CLINICAL DATA:  Code stroke. 63 year old male with left facial droop. EXAM: CT HEAD WITHOUT CONTRAST TECHNIQUE: Contiguous axial images were obtained from the base of the skull through the vertex without intravenous contrast. COMPARISON:  Brain MRI, CTA head and neck 12/04/2017. FINDINGS: Brain: Chronic infarcts with encephalomalacia in the left cerebellum, right occipital lobe. Widespread Patchy and confluent cerebral white matter and bilateral deep gray matter hypodensity. Stable gray-white matter differentiation throughout the brain since 2019. No midline shift, ventriculomegaly, mass effect, evidence of mass lesion, intracranial hemorrhage or  evidence of cortically based acute infarction. Vascular: Calcified atherosclerosis at the skull base. No suspicious intracranial vascular hyperdensity. Skull: No acute osseous abnormality identified. Sinuses/Orbits: Visualized paranasal sinuses and mastoids are stable and well pneumatized. Other: No acute orbit or scalp soft tissue findings. ASPECTS [facility] Stroke Program Early CT Score) - Ganglionic level infarction (caudate, lentiform nuclei, internal capsule, insula, M1-M3 cortex): 7 - Supraganglionic infarction (M4-M6 cortex): 3 Total score (0-10 with 10 being  normal): 10 (chronic encephalomalacia) IMPRESSION: 1. Extensive chronic ischemic disease appears stable by CT since 2019. 2. No acute intracranial hemorrhage or cortically based infarct identified. 3. ASPECTS is 10. 4. These results were communicated to Dr. Cheral Marker at 3:14 am on 11/15/2018 by text page via the [facility] messaging system. Electronically Signed   By: Genevie Ann M.D.   On: 11/15/2018 03:14   2D Echocardiogram  1. The left ventricle has normal systolic function, with an ejection fraction of 55-60%. The cavity size was normal. There is mildly increased left ventricular wall thickness. Left ventricular diastolic Doppler parameters are consistent with  pseudonormal.  2. The right ventricle has normal systolc function. The cavity was normal. There is no increase in right ventricular wall thickness.  3. No evidence of mitral valve stenosis.  4. No stenosis of the aortic valve.  5. The aortic root and ascending aorta are normal in size and structure.   PHYSICAL EXAM: Pleasant middle-age male currently not in distress. . Afebrile. Head is nontraumatic. Neck is supple without bruit.    Cardiac exam no murmur or gallop. Lungs are clear to auscultation. Distal pulses are well felt. Neurological Exam ;  Awake alert oriented to person only.  Diminished attention, registration and recall.  Speech is slightly hypophonic and nonfluent but speaks mostly a few words and short sentences only.  Follows only simple midline commands.  Extraocular movements are full range without nystagmus.  Blinks to threat bilaterally.  Mild left lower facial weakness.  Tongue midline.  Motor system exam shows mild weakness of left grip and intrinsic hand muscles.  Orbits right over left upper extremity.  Mild left greater than right lower extremity proximal and distal weakness but effort is variable and poor.  Mild subjective left upper and lower extremity diminished sensation.  Coordination is slow but accurate.  Gait not  tested.   ASSESSMENT/PLAN [name] is a 63 y.o. male with history of dementia presenting from ALF with hypotension, hyperglycemia and unsteady gait. Found to have a L facial droop and lip weakness   Stroke:  Incidental right lacunar infarct secondary to either large vessel disease from ICA or a large lacune from small vessel disease source (not felt to be from an embolic source)  Code Stroke CT head No acute stroke. Extensive small vessel disease. ASPECTS 10.     MRI - MRA head - MRA neck R BG and CSV infarct. Mult old B BG and CSV, thalami and brainstem lacunes. Old L PICA infarct. Advanced small vessel disease. Atrophy. 66mm posterior spinal mass w/ mass effection on spinal cord. Decreased marrow signal upper cervical spine. Mod stenosis prox L VA. Mild distal SVD.  2D Echo EF 55-60%. No source of embolus   Loop - last interrogated 4/14 w/o hx AF. Attending has requested interrogation.  LDL 39  HgbA1c 5.2  Heparin 5000 units sq tid for VTE prophylaxis  clopidogrel 75 mg daily prior to admission, now on aspirin 81 mg daily and clopidogrel 75 mg daily. Continue DAPT x 3 weeks then  plavix alone.    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  Likely not a surgical candidate  No need for urgent removal  Follow up Dr. Arnoldo Morale as an OP  Hypertension  Stable . Permissive hypertension (OK if < 220/120) but gradually normalize in 5-7 days . Long-term BP goal normotensive  Hyperlipidemia  Home meds:  lipitor 20, resumed in hospital  LDL 39, goal < 70  Given low LDL, decrease lipitor dose to 10  Continue statin at discharge  Other Stroke Risk Factors  Advanced age  Cigarette smoker, advised to stop smoking  Hx stroke/TIA  11/2017 - Strokes: left PCA and left MCA/ACA scattered infarcts, etiology unclear. TEE leg. Loop  placed (no AF noted, last checked 11/02/2018).  Other Active Problems  Baseline dementia  Anemia 8.5->7.8  CKD stage 3  BPH  Hospital day # 6     63 year old male with prior history of cryptogenic stroke and mild dementia presented with imbalance and mild left-sided weakness secondary to right basal ganglia infarct etiology indeterminate small vessel disease versus cryptogenic.  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  Agree with neurosurgery that patient is not a candidate for neurosurgery at the present time.  Recommend dual antiplatelet therapy for 3 weeks followed by Plavix alone.   Stroke team will sign off.  Follow-up as an outpatient in the stroke clinic in 6 weeks. Antony Contras, MD Medical Director [facility] Stroke Center Pager: [phone] 11/16/2018 2:24 PM   To contact Stroke Continuity provider, please refer to [email or web address] After hours, contact General Neurology

## 2018-11-16 NOTE — Progress Notes (Signed)
  Echocardiogram 2D Echocardiogram limited w/ bubble study has been performed.  Sayeed Weatherall L Androw 11/16/2018, 9:55 AM

## 2018-11-16 NOTE — Evaluation (Signed)
Speech Language Pathology Evaluation Patient Details Name: Karl Miller MRN: [id number] DOB: [birth year] Today's Date: 11/16/2018 Time: 1003-1030 SLP Time Calculation (min) (ACUTE ONLY): 27 min  Problem List:  Patient Active Problem List   Diagnosis Date Noted  . CVA (cerebral vascular accident) ([place]) 11/15/2018  . Benign essential HTN   . Tobacco abuse   . TIA (transient ischemic attack)   . Diastolic dysfunction   . Prediabetes   . Acute blood loss anemia   . Stage 3 chronic kidney disease ([place])   . Hyperlipidemia   . Syphilis   . Cerebral embolism with cerebral infarction 12/05/2017  . Smoker   . Acute encephalopathy 12/04/2017  . HTN (hypertension) 12/04/2017  . AKI (acute kidney injury) ([place]) 12/04/2017  . Elevated PSA 12/04/2017  . Rhabdomyolysis 12/04/2017   Past Medical History:  Past Medical History:  Diagnosis Date  . Acute encephalopathy 12/03/2017   [name] 12/04/2017  . High cholesterol   . Hypertension   . Stroke ([place])   . TIA (transient ischemic attack) 12/03/2017   [name] 12/04/2017   Past Surgical History:  Past Surgical History:  Procedure Laterality Date  . LOOP RECORDER INSERTION N/A 12/07/2017   Procedure: LOOP RECORDER INSERTION;  Surgeon: Constance Haw, MD;  Location: [place] [facility];  Service: Cardiovascular;  Laterality: N/A;  . NO PAST SURGERIES    . 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 MRI of the brain revealed: Acute nonhemorrhagic lacunar infarct involving the right basal ganglia and centrum  semi ovale. Multiple remote nonhemorrhagic lacunar infarcts involving the basal ganglia and centrum semi ovale bilaterally. Remote lacunar infarcts of the thalami and brainstem. Remote left PICA territory infarct. 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   Assessment / Plan / Recommendation Clinical Impression  Pt currently resides in an ALF and has a baseline diagnosis of vasular dementia. Pt denied any baseline or new deficits in speech, language, or cognitive; however, his reliability as a historian is questioned due tohis diagnosis of dementia and the varibility of information which was provided during this evaluation. With the pt's permission, his mother was contacted and she reported that the pt did live with her prior to his being at the ALF and that he has had trouble with his memory and attention prior to admission. His speech and language skills are currently within normal limits and based on reports from his mother and medical record his cognitive skills appear to be at baseline. Further acute skilled SLP services are therefore not clinically indicated at this time. Pt,  nursing, and his mother were educated regarding results and recommendations; all parties verbalized understanding as well as agreement with plan of care.    SLP Assessment  SLP Recommendation/Assessment: Patient does not need any further Speech Lanaguage Pathology Services SLP Visit Diagnosis: Cognitive communication deficit (R41.841)    Follow Up Recommendations  None;24 hour supervision/assistance    Frequency and Duration           SLP Evaluation Cognition  Overall Cognitive Status: History of  cognitive impairments - at baseline Arousal/Alertness: Awake/alert Orientation Level: Oriented to person;Oriented to place;Disoriented to time;Disoriented to situation(Month + Date: 5th Year: 2010 - Day: Wednesday) Attention:  Focused;Sustained Focused Attention: Appears intact Sustained Attention: Impaired Sustained Attention Impairment: Verbal complex Memory: Impaired Memory Impairment: Retrieval deficit;Decreased recall of new information(Immediate: 3/3; Delayed: 0/3; with mod cues: 3/3) Awareness: Impaired Awareness Impairment: Intellectual impairment Problem Solving: Impaired Problem Solving Impairment: Verbal complex(0/3)       Comprehension  Auditory Comprehension Overall Auditory Comprehension: Impaired at baseline Yes/No Questions: Impaired Basic Biographical Questions: (5/5) Complex Questions: (3/5) Paragraph Comprehension (via yes/no questions): (2/4) Commands: Impaired One Step Basic Commands: (4/4) Two Step Basic Commands: (3/4) Multistep Basic Commands: (0/3) Visual Recognition/Discrimination Discrimination: Within Function Limits    Expression Expression Primary Mode of Expression: Verbal Verbal Expression Overall Verbal Expression: Appears within functional limits for tasks assessed Initiation: No impairment Automatic Speech: Counting;Day of week(WNL; Months: 0/12) Level of Generative/Spontaneous Verbalization: Sentence;Conversation Repetition: No impairment(5/5) Naming: No impairment Responsive: (3/5) Confrontation: (10/10) Convergent: (Sentence completion: 5/5) Written Expression Dominant Hand: Right   Oral / Motor  Oral Motor/Sensory Function Overall Oral Motor/Sensory Function: Mild impairment Facial ROM: Reduced left;Suspected CN VII (facial) dysfunction Facial Symmetry: Abnormal symmetry left;Suspected CN VII (facial) dysfunction Facial Strength: Reduced left;Suspected CN VII (facial) dysfunction Facial Sensation: Within Functional Limits Lingual ROM: Within Functional Limits Lingual Symmetry: Within Functional Limits Lingual Strength: Within Functional Limits Mandible: Within Functional Limits Motor Speech Overall Motor Speech: Appears within functional limits for  tasks assessed Respiration: Within functional limits Phonation: Normal Resonance: Within functional limits Articulation: Within functional limitis Intelligibility: Intelligible Motor Planning: Witnin functional limits Motor Speech Errors: Not applicable   Carletta Feasel I. Hardin Negus, [place], [place] Office number [phone] Pager [place] 11/16/2018, 10:35 AM

## 2018-11-16 NOTE — Progress Notes (Signed)
Family Medicine Teaching Service Daily Progress Note Intern Pager: [phone]  Patient name: Karl Miller Medical record number: [id number] Date of birth: 23-Nov-1955 Age: 63 y.o. Gender: male  Primary Care Provider: Administration, [facility] Consultants: Neurology Code Status: FULL  Pt Overview and Major Events to Date:  4/27 admit to FPTS for new L Facial droop, CVA  Assessment and Plan: Aquarius Latouche is a 63 y.o. male presenting with facial droop . 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 Neuro following. Per neuro, hypotension is most likely precipitant of presentation. - monitor on tele - neuro following, appreciate recs - PT/OT - orthostatic vitals positive from sitting to standing; holding BP meds currently - atorvastatin, ASA, plavix - Cards asked to interrogate loop recorder for events around the time of the stroke  Anemia, acute on chronic Likely 2/2 CKD, however hgb 7.8 today from 8.5 yesterday. 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 Strength and sensation intact. Neurosurgery Dr. Arnoldo Morale following. Likely pt not a good surgical candidate - follow up with neurosurgery as outpt  Dementia uncertain baseline. Waxing and waning mentation is worst in the evening, consistent with sundowning. - continue home donepezil  HTN  Home meds:prinzide (lisinopril HCTZ 20-12.5mg ) daily -holding home meds in setting of hypotension  -restart as BP tolerates   HLD Home meds: atorvastatin 20mg  -continue home meds  HFrEF Echo from 11/2017 showing LVEF 45-50%. Unclear dry weight. Euvolemic on exam  -monitor Is and Os -daily weights.   Acute CKD3a AKI improved with fluids. Cr 1.5 at baseline. - monitor Cr on AM BMP  BPH Home meds:  finasteride. No urine output recorded. - consider restarting finasteride  FEN/GI: patient passed swallow eval Prophylaxis: lovenox  Disposition: pending PT/OT  Subjective:  No acute events overnight.  Objective: Temp:  [98.2 F (36.8 C)-98.7 F (37.1 C)] 98.6 F (37 C) (04/28 1231) Pulse Rate:  [67-89] 89 (04/28 1231) Resp:  [16-18] 18 (04/28 1231) BP: (119-142)/(75-97) 142/92 (04/28 1231) SpO2:  [96 %-100 %] 96 % (04/28 1231) Weight:  [73.7 kg] 73.7 kg (04/28 0500) Physical Exam: General: NAD, pleasant and comfortable Cardiovascular: no m/r/g, RRR Respiratory: comfortable work of breathing, no W/R/R Abdomen: nondistended, nontender Extremities: no LE edema Neuro: +L sided facial droop  Laboratory: Recent Labs  Lab 11/15/18 0256 11/15/18 0302 11/16/18 0417 11/16/18 1157  WBC 5.5  --  5.0 4.0  HGB 8.3* 8.5* 7.8* 8.1*  HCT 26.5* 25.0* 24.0* 25.6*  PLT 343  --  289 294   Recent Labs  Lab 11/15/18 0256 11/15/18 0302 11/16/18 0417  NA 140 143 140  K 3.7 3.9 3.6  CL 104  --  108  CO2 28  --  25  BUN 27*  --  23  CREATININE 1.75*  --  1.50*  CALCIUM 9.4  --  9.3  PROT 6.2*  --   --   BILITOT 0.2*  --   --   ALKPHOS 44  --   --   ALT 19  --   --   AST 19  --   --   GLUCOSE 122*  --  96   Imaging/Diagnostic Tests: No results found.  Everrett Coombe, MD 11/16/2018, 2:10 PM PGY-3, [place] Intern pager: [phone],  text pages welcome

## 2018-11-16 NOTE — Progress Notes (Signed)
OT evaluation: Pt admitted for below, limited by impaired balance, cognition.  Noted baseline cognitive decline, oriented to self, place and situation but not time, able to follow 1 step commands with increased time and fair consistently, poor safety awareness and awareness to deficits.  Poor historian, per chart lives in [place] and reached out to CM/SW in order to determine level of assist available. Patient able to complete functional mobility with min assist, basic transfers with min assist, UB/LB ADLs with min assist.  He will benefit from continued OT services while admitted and after dc at [facility] level in order to maximize safety and independence, if he does not have 24/7 assist at dc he will need SNF.  Will follow.     11/16/18 0949  OT Visit Information  Last OT Received On 11/16/18  Assistance Needed +1  PT/OT/SLP Co-Evaluation/Treatment Yes  Reason for Co-Treatment For patient/therapist safety;To address functional/ADL transfers;Necessary to address cognition/behavior during functional activity  OT goals addressed during session ADL's and self-care  History of Present Illness Pt is a 63 y/o male presenting with L facial droop.  MRI reveals R basal ganglia infarct, and incedental finding of 56mm posterior spinal mass at C4-5. PMH: dementia, (from ALF), HTN, CVA.   Precautions  Precautions Fall  Restrictions  Weight Bearing Restrictions No  Home Living  Family/patient expects to be discharged to: Private residence  Living Arrangements Parent  Available Help at Discharge Family;Available PRN/intermittently  Type of Home House  Home Access Stairs to enter  Entrance Stairs-Number of Steps several  Home Layout One level  Bathroom Counselling psychologist - 2 wheels  Additional Comments above report from pt, poor historian with chart review revealing from ALF   Prior Function  Level of Independence Needs assistance  Gait / Transfers Assistance Needed reports using  RW for mobility with assist   ADL's / Homemaking Assistance Needed reports some assist with ADLs   Comments poor historian, unclear accuracy   Communication  Communication No difficulties  Pain Assessment  Pain Assessment No/denies pain  Cognition  Arousal/Alertness Awake/alert  Behavior During Therapy WFL for tasks assessed/performed  Overall Cognitive Status History of cognitive impairments - at baseline  General Comments pt able to follow 1 step commands inconsistently, oriented to person, place and situation but not time; poor awareness to deficits and safety; noted hx of dementia  Upper Extremity Assessment  Upper Extremity Assessment RUE deficits/detail;LUE deficits/detail  RUE Deficits / Details grossly 4/5, sensation and coordination WFL   LUE Deficits / Details grossly 4/5, sensation and coordination [facility]   Lower Extremity Assessment  Lower Extremity Assessment Defer to PT evaluation  ADL  Overall ADL's  Needs assistance/impaired  Grooming Minimal assistance;Standing;Oral care  Grooming Details (indicate cue type and reason) min assist for balance support, min cueing to sequence and complete tasks   Upper Body Bathing Minimal assistance;Sitting  Lower Body Bathing Minimal assistance;Sit to/from stand  Upper Body Dressing  Minimal assistance;Sitting  Lower Body Dressing Minimal assistance;Sit to/from Retail buyer Minimal assistance;Ambulation;+2 for safety/equipment;RW  Toilet Transfer Details (indicate cue type and reason) simulated in room  Toileting- Clothing Manipulation and Hygiene Moderate assistance;Sit to/from stand  Functional mobility during ADLs Minimal assistance;Rolling walker (min assist at times for walker mgmt and posterior lean)  General ADL Comments pt limited by cognition, balance  Vision- Assessment  Vision Assessment? No apparent visual deficits  Additional Comments further assessment recommended  Bed Mobility  Overal bed mobility Needs  Assistance  Bed Mobility Supine to Sit  Supine to sit Supervision  General bed mobility comments supervision for safety   Transfers  Overall transfer level Needs assistance  Equipment used Rolling walker (2 wheeled)  Transfers Sit to/from Stand  Sit to Stand Min assist  General transfer comment min assist for safety and balance, attempted redirection of hands but pt pulling on walker to stand up before able to redirect  Balance  Overall balance assessment Needs assistance  Sitting-balance support No upper extremity supported;Feet supported  Sitting balance-Leahy Scale Fair  Standing balance support No upper extremity supported;During functional activity  Standing balance-Leahy Scale Poor  Standing balance comment posterior lean at times during grooming tasks with 0 hand support, preference to UE support  OT - End of Session  Equipment Utilized During Treatment Rolling walker  Activity Tolerance Patient tolerated treatment well  Patient left in chair;with call bell/phone within reach;with chair alarm set (SLP in room)  Nurse Communication Mobility status  OT Assessment  OT Recommendation/Assessment Patient needs continued OT Services  OT Visit Diagnosis Other abnormalities of gait and mobility (R26.89);Other symptoms and signs involving cognitive function  OT Problem List Decreased activity tolerance;Impaired balance (sitting and/or standing);Decreased coordination;Decreased cognition;Decreased safety awareness;Decreased knowledge of use of DME or AE  OT Plan  OT Frequency (ACUTE ONLY) Min 2X/week  OT Treatment/Interventions (ACUTE ONLY) Self-care/ADL training;Therapeutic exercise;DME and/or AE instruction;Therapeutic activities;Cognitive remediation/compensation;Balance training;Patient/family education  AM-PAC OT "6 Clicks" Daily Activity Outcome Measure (Version 2)  Help from another person eating meals? 3  Help from another person taking care of personal grooming? 3  Help from  another person toileting, which includes using toliet, bedpan, or urinal? 2  Help from another person bathing (including washing, rinsing, drying)? 3  Help from another person to put on and taking off regular upper body clothing? 3  Help from another person to put on and taking off regular lower body clothing? 3  6 Click Score 17  OT Recommendation  Follow Up Recommendations Home health OT;Supervision/Assistance - 24 hour (if pt does not have 24/7 support will need SNF)  OT Equipment None recommended by OT  Individuals Consulted  Consulted and Agree with Results and Recommendations Patient  Acute Rehab OT Goals  Patient Stated Goal none stated  OT Goal Formulation Patient unable to participate in goal setting  Time For Goal Achievement 11/30/18  Potential to Achieve Goals Good  OT Time Calculation  OT Start Time (ACUTE ONLY) 0945  OT Stop Time (ACUTE ONLY) 1003  OT Time Calculation (min) 18 min  OT General Charges  $OT Visit 1 Visit  OT Evaluation  $OT Eval Moderate Complexity 1 Mod  Written Expression  Dominant Hand Right    Delight Stare, OT Acute Rehabilitation Services Pager [phone] Office [phone]

## 2018-11-17 DIAGNOSIS — I63019 Cerebral infarction due to thrombosis of unspecified vertebral artery: Secondary | ICD-10-CM

## 2018-11-17 DIAGNOSIS — I6310 Cerebral infarction due to embolism of unspecified precerebral artery: Secondary | ICD-10-CM

## 2018-11-17 DIAGNOSIS — R195 Other fecal abnormalities: Secondary | ICD-10-CM

## 2018-11-17 DIAGNOSIS — D649 Anemia, unspecified: Secondary | ICD-10-CM

## 2018-11-17 DIAGNOSIS — D500 Iron deficiency anemia secondary to blood loss (chronic): Secondary | ICD-10-CM

## 2018-11-17 LAB — CBC
HCT: 21.5 % — ABNORMAL LOW (ref 39.0–52.0)
HCT: 22.3 % — ABNORMAL LOW (ref 39.0–52.0)
Hemoglobin: 7 g/dL — ABNORMAL LOW (ref 13.0–17.0)
Hemoglobin: 7.1 g/dL — ABNORMAL LOW (ref 13.0–17.0)
MCH: 29.8 pg (ref 26.0–34.0)
MCH: 29.5 pg (ref 26.0–34.0)
MCHC: 32.6 g/dL (ref 30.0–36.0)
MCHC: 31.8 g/dL (ref 30.0–36.0)
MCV: 91.5 fL (ref 80.0–100.0)
MCV: 92.5 fL (ref 80.0–100.0)
Platelets: 303 10*3/uL (ref 150–400)
Platelets: 327 10*3/uL (ref 150–400)
RBC: 2.35 MIL/uL — ABNORMAL LOW (ref 4.22–5.81)
RBC: 2.41 MIL/uL — ABNORMAL LOW (ref 4.22–5.81)
RDW: 14.8 % (ref 11.5–15.5)
RDW: 15 % (ref 11.5–15.5)
WBC: 4.9 10*3/uL (ref 4.0–10.5)
WBC: 4.3 10*3/uL (ref 4.0–10.5)
nRBC: 0 % (ref 0.0–0.2)
nRBC: 0 % (ref 0.0–0.2)

## 2018-11-17 LAB — SAVE SMEAR(SSMR), FOR PROVIDER SLIDE REVIEW

## 2018-11-17 MED ORDER — SODIUM CHLORIDE 0.9 % IV SOLN
INTRAVENOUS | Status: DC
  Administered 2018-11-17: 10:00:00 via INTRAVENOUS

## 2018-11-17 NOTE — Progress Notes (Signed)
Occupational Therapy Treatment Patient Details Name: Karl Miller MRN: [id number] DOB: 17-Sep-1955 Today's Date: 11/17/2018    History of present illness Pt is a 63 y/o male presenting with L facial droop.  MRI reveals R basal ganglia infarct, lacunar infarcts in thalamus and brainstem, and incedental finding of 13mm posterior spinal mass at C4-5. PMH: dementia, (from ALF), HTN, CVA.    OT comments  Pt demonstrates cognitive deficits and min (A) for transfers. Pt fall risk and should have (A) for all mobility. Pt needs (A) for all adls due to cognitive deficits.   Follow Up Recommendations  Supervision/Assistance - 24 hour;SNF    Equipment Recommendations  None recommended by OT    Recommendations for Other Services      Precautions / Restrictions Precautions Precautions: Fall Restrictions Weight Bearing Restrictions: No       Mobility Bed Mobility Overal bed mobility: Modified Independent               Transfers Overall transfer level: Needs assistance Equipment used: Rolling walker (2 wheeled) Transfers: Sit to/from Stand Sit to Stand: Min assist         General transfer comment: Min A for steadying assist. Performed multiple times throughout session. Pt inconsistently responding to multimodal cues for safe hand placement. Would place hands on arm rests of chair to stand, however, pt would immediately move hands to RW and pull up using RW.     Balance Overall balance assessment: Needs assistance Sitting-balance support: No upper extremity supported;Feet supported Sitting balance-Leahy Scale: Fair     Standing balance support: Bilateral upper extremity supported;During functional activity Standing balance-Leahy Scale: Poor Standing balance comment: Reliant on BUE support              High level balance activites: Backward walking High Level Balance Comments: Posterior lean during backwards walking. Mod A for steadying and cues for safe use of RW.             ADL either performed or assessed with clinical judgement   ADL Overall ADL's : Needs assistance/impaired     Grooming: Minimal assistance;Wash/dry hands;Sitting   Upper Body Bathing: Moderate assistance;Sitting   Lower Body Bathing: Maximal assistance;Sit to/from stand   Upper Body Dressing : Moderate assistance;Sitting       Toilet Transfer: Minimal assistance;RW             General ADL Comments: pt reports no when asked if he was wet. pt with d/c of foley present and bed wet with urine     Vision       Perception     Praxis      Cognition Arousal/Alertness: Awake/alert Behavior During Therapy: WFL for tasks assessed/performed Overall Cognitive Status: History of cognitive impairments - at baseline                                 General Comments: Disoriented to time and situation. Poor awareness of safety and deficits and pt inconsistently following commands for safety during mobility.         Exercises   Shoulder Instructions       General Comments      Pertinent Vitals/ Pain       Pain Assessment: No/denies pain  Home Living  Prior Functioning/Environment              Frequency  Min 2X/week        Progress Toward Goals  OT Goals(current goals can now be found in the care plan section)  Progress towards OT goals: Progressing toward goals  Acute Rehab OT Goals Patient Stated Goal: none stated OT Goal Formulation: Patient unable to participate in goal setting Time For Goal Achievement: 11/30/18 Potential to Achieve Goals: Good ADL Goals Pt Will Perform Grooming: with supervision;standing Pt Will Perform Lower Body Dressing: with supervision;sit to/from stand Pt Will Transfer to Toilet: with supervision;ambulating Additional ADL Goal #1: Pt will follow 1 step commands with 90% accuracy in order to optimize participation in ADLs.  Plan Discharge plan  remains appropriate    Co-evaluation                 AM-PAC OT "6 Clicks" Daily Activity     Outcome Measure   Help from another person eating meals?: A Little Help from another person taking care of personal grooming?: A Little Help from another person toileting, which includes using toliet, bedpan, or urinal?: A Lot Help from another person bathing (including washing, rinsing, drying)?: A Little Help from another person to put on and taking off regular upper body clothing?: A Little Help from another person to put on and taking off regular lower body clothing?: A Little 6 Click Score: 17    End of Session Equipment Utilized During Treatment: Gait belt;Rolling walker  OT Visit Diagnosis: Unsteadiness on feet (R26.81)   Activity Tolerance Patient tolerated treatment well   Patient Left in chair;with call bell/phone within reach;with chair alarm set   Nurse Communication Mobility status;Precautions        Time: 6629-4765 OT Time Calculation (min): 16 min  Charges: OT General Charges $OT Visit: 1 Visit OT Treatments $Self Care/Home Management : 8-22 mins   Jeri Modena, OTR/L  Acute Rehabilitation Services Pager: [phone] Office: [phone] .    Jeri Modena 11/17/2018, 1:20 PM

## 2018-11-17 NOTE — Plan of Care (Signed)
  Problem: Education: Goal: Knowledge of disease or condition will improve Outcome: Progressing Goal: Knowledge of secondary prevention will improve Outcome: Progressing Goal: Knowledge of patient specific risk factors addressed and post discharge goals established will improve Outcome: Progressing   Problem: Self-Care: Goal: Ability to participate in self-care as condition permits will improve Outcome: Progressing   Problem: Nutrition: Goal: Risk of aspiration will decrease Outcome: Progressing   Problem: Ischemic Stroke/TIA Tissue Perfusion: Goal: Complications of ischemic stroke/TIA will be minimized Outcome: Progressing

## 2018-11-17 NOTE — Plan of Care (Signed)
  Problem: Self-Care: Goal: Ability to participate in self-care as condition permits will improve Outcome: Progressing   Problem: Education: Goal: Knowledge of patient specific risk factors addressed and post discharge goals established will improve Outcome: Progressing   Problem: Education: Goal: Knowledge of secondary prevention will improve Outcome: Progressing   Problem: Nutrition: Goal: Risk of aspiration will decrease Outcome: Progressing

## 2018-11-17 NOTE — NC FL2 (Signed)
[place] LEVEL OF CARE SCREENING TOOL     IDENTIFICATION  Patient Name: Karl Miller Birthdate: [birth year] Sex: male Admission Date (Current Location): 11/15/2018  [facility] [place] and [place] Number:  Herbalist and Address:  [facility] [place]. [facility], [place] [address] [place], [place] [address]      Provider Number: [id number]  Attending Physician Name and Address:  Martyn Malay, MD  Relative Name and Phone Number:       Current Level of Care: Hospital Recommended Level of Care: [place] Prior Approval Number:    Date Approved/Denied:   PASRR Number: [id number]  Discharge Plan: SNF    Current Diagnoses: Patient Active Problem List   Diagnosis Date Noted  . CVA (cerebral vascular accident) ([place]) 11/15/2018  . Benign essential HTN   . Tobacco abuse   . TIA (transient ischemic attack)   . Diastolic dysfunction   . Prediabetes   . Acute blood loss anemia   . Stage 3 chronic kidney disease ([place])   . Hyperlipidemia   . Syphilis   . Cerebral embolism with cerebral infarction 12/05/2017  . Smoker   . Acute encephalopathy 12/04/2017  . HTN (hypertension) 12/04/2017  . AKI (acute kidney injury) ([place]) 12/04/2017  . Elevated PSA 12/04/2017  . Rhabdomyolysis 12/04/2017    Orientation RESPIRATION BLADDER Height & Weight     Self, Situation, Place  Normal Incontinent Weight: 163 lb 5.8 oz (74.1 kg) Height:  6' (182.9 cm)  BEHAVIORAL SYMPTOMS/MOOD NEUROLOGICAL BOWEL NUTRITION STATUS      Continent Diet(see DC summary)  AMBULATORY STATUS COMMUNICATION OF NEEDS Skin   Limited Assist Verbally Normal                       Personal Care Assistance Level of Assistance  Bathing, Feeding, Dressing Bathing Assistance: Limited assistance Feeding assistance: Independent Dressing Assistance: Limited assistance     Functional Limitations Info  Sight, Hearing, Speech Sight Info: Adequate Hearing Info: Adequate Speech  Info: Adequate    SPECIAL CARE FACTORS FREQUENCY  PT (By licensed PT), OT (By licensed OT)     PT Frequency: 5x/wk OT Frequency: 5x/wk            Contractures Contractures Info: Not present    Additional Factors Info  Code Status, Allergies Code Status Info: Full Allergies Info: NKA           Current Medications (11/17/2018):  This is the current hospital active medication list Current Facility-Administered Medications  Medication Dose Route Frequency Provider Last Rate Last Dose  . 0.9 %  sodium chloride infusion   Intravenous Continuous Everrett Coombe, MD 75 mL/hr at 11/17/18 1024    . acetaminophen (TYLENOL) tablet 650 mg  650 mg Oral Q4H PRN Caroline More, DO       Or  . acetaminophen (TYLENOL) solution 650 mg  650 mg Per Tube Q4H PRN Caroline More, DO       Or  . acetaminophen (TYLENOL) suppository 650 mg  650 mg Rectal Q4H PRN Caroline More, DO      . aspirin EC tablet 81 mg  81 mg Oral Daily Burnetta Sabin L, NP   81 mg at 11/17/18 0849  . atorvastatin (LIPITOR) tablet 10 mg  10 mg Oral q1800 Donzetta Starch, NP   10 mg at 11/16/18 1830  . clopidogrel (PLAVIX) tablet 75 mg  75 mg Oral Daily Tammi Klippel, Sherin, DO   75 mg at 11/17/18  5790  . donepezil (ARICEPT) tablet 10 mg  10 mg Oral Loreli Dollar, Sherin, DO   10 mg at 11/17/18 0644  . ferrous sulfate tablet 325 mg  325 mg Oral Daily Caroline More, DO   325 mg at 11/17/18 0849  . finasteride (PROSCAR) tablet 5 mg  5 mg Oral Daily Meccariello, Bernita Raisin, DO   5 mg at 11/17/18 0848  . heparin injection 5,000 Units  5,000 Units Subcutaneous Q8H Caroline More, DO   5,000 Units at 11/17/18 3833  . pneumococcal 23 valent vaccine (PNU-IMMUNE) injection 0.5 mL  0.5 mL Intramuscular Tomorrow-1000 Martyn Malay, MD         Discharge Medications: Please see discharge summary for a list of discharge medications.  Relevant Imaging Results:  Relevant Lab Results:   Additional Information SS#: [id number]  [name], LCSW

## 2018-11-17 NOTE — Progress Notes (Signed)
Family Medicine Teaching Service Daily Progress Note Intern Pager: [phone]  Patient name: Karl Miller Medical record number: [id number] Date of birth: 05-09-1956 Age: 63 y.o. Gender: male  Primary Care Provider: Administration, [facility] Consultants: Neurology Code Status: FULL  Pt Overview and Major Events to Date:  4/27 admit to FPTS for new L Facial droop, CVA  Assessment and Plan: Karl Miller is a 63 y.o. male presenting with facial droop . 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 Neuro following. Per neuro, hypotension is most likely precipitant of presentation. - monitor on tele - stroke team signed off - PT/OT - orthostatic vitals positive from sitting to standing; holding BP meds currently - atorvastatin, ASA, plavix - Cards asked to interrogate loop recorder for events around the time of the stroke  Anemia, acute on chronic Initially thought to be 2/2 CKD. BL Hgb uncertan; per chart review Hgb 8.7 on 10/28/2018 but previously normal (11-12) in 11/2017. Today Hgb is 7.0 from 8.1 yesterday afternoon. Hct low at 21.5. MCV WNL. RDW WNL. Platelets 303. Anemia panel shows elevated immature retics at 29.8%, though absolute retic ct is WNL. Ferritin low at 10. Iron 242 s/p daily repletion, TIBC WNL at 336. BP low this morning at 98/72, HR 72. Folate and B12 WNL. No hx of colonoscopy in the chart or per pt report. 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 Strength and sensation intact. Neurosurgery Dr. Arnoldo Morale following. Likely pt not a good surgical candidate - follow up with neurosurgery as outpt  Dementia uncertain baseline. Waxing and waning mentation  is worst in the evening, consistent with sundowning. - continue home donepezil  HTN  Home meds:prinzide (lisinopril HCTZ 20-12.5mg ) daily -holding home meds in setting of hypotension  -restart as BP tolerates   HLD Home meds: atorvastatin 20mg  -continue home meds  HFrEF Echo from 11/2017 showing LVEF 45-50%. Unclear dry weight. Euvolemic on exam  -monitor Is and Os -daily weights.   Acute CKD3a AKI improved with fluids. Cr 1.5 at baseline. - monitor Cr on AM BMP  BPH Home meds: finasteride. - UOP 674 yesterday - continue home finasteride  FEN/GI: patient passed swallow eval Prophylaxis: lovenox  Disposition: pending PT/OT  Subjective:  No acute events overnight. Patient denies dyspnea, palpitations, chest pain, numbness, tingling or dizziness. He reports he has not had grossly bloody or melenotic stools. He has not been out of bed much per his report.  Objective: Temp:  [98.4 F (36.9 C)-98.8 F (37.1 C)] 98.8 F (37.1 C) (04/29 0744) Pulse Rate:  [71-94] 72 (04/29 0744) Resp:  [17-18] 18 (04/29 0744) BP: (98-142)/(72-92) 98/72 (04/29 0744) SpO2:  [96 %-100 %] 100 % (04/29 0744) Weight:  [74.1 kg] 74.1 kg (04/29 0504) Physical Exam:  General: NAD, pleasant, nontoxic Cardiovascular: RRR, no m/r/g Respiratory: CTA, no W/R/R Abdomen: nondistended, nontender Extremities: no LE edema Neuro: +L sided facial droop, otherwise neuro-intact. 5/5 strength equally in 4 extremities  Laboratory: Recent Labs  Lab 11/16/18 0417 11/16/18 1157 11/17/18 0501  WBC 5.0 4.0 4.9  HGB 7.8* 8.1* 7.0*  HCT 24.0* 25.6* 21.5*  PLT 289 294 303   Recent Labs  Lab 11/15/18 0256 11/15/18  0302 11/16/18 0417  NA 140 143 140  K 3.7 3.9 3.6  CL 104  --  108  CO2 28  --  25  BUN 27*  --  23  CREATININE 1.75*  --  1.50*  CALCIUM 9.4  --  9.3  PROT 6.2*  --   --   BILITOT 0.2*  --   --   ALKPHOS 44  --   --   ALT 19  --   --   AST 19  --   --   GLUCOSE 122*  --  96    Imaging/Diagnostic Tests: No results found.  Everrett Coombe, MD 11/17/2018, 9:26 AM PGY-3, [place] Intern pager: [phone], text pages welcome

## 2018-11-17 NOTE — Consult Note (Addendum)
Referring Provider: Family Medicine Teaching Service  Primary Care Physician:  Administration, [facility] Primary Gastroenterologist: unassigned Reason for Consultation:   anemia    ASSESSMENT / PLAN:    84. 63 year old male with a history of recurrent CVAs on plavix. Now admitted with acute cryptogenic CVA. Plan is for dual antiplatelet therapy for 3 weeks followed by Plavix alone.   2. IDA on Plavix and ? Meloxicam ( patient unable to provide good history).  Hgb ~11.5 one year ago, down to 8.7 at ED visit on 10/28/18 ( on iron at home). Further decline in hgb this admission to 7.0.  History may not be reliable but he denies overt GI blood loss or focal GI symptoms. -ideally patient need EGD / colonoscopy for evaluation of IDA, especially given long term need for plavix. Given CVA just 2 days ago I suspect that we will hold off for right now.   3. Acute on chronic CKD 3, improving. Cr 1.5.  C4-5 spinal mass, incidental finding on MRI brain. Neurosurgery following but not surgical candidate.   4. Dementia, on Aricept. Limited historian. He is oriented to place and month but can't recall the year  HPI:   Karl Miller is a 63 y.o. male with a history of recurrent CVAs on plavix. Admitted 11/15/18 with acute cryptogenic CVA. No evidence for embolus on echo. Hgb noted to be low.  Baseline seems to be around 11.5.  Patient was seen in the ED earlier this month with dizziness, hemoglobin at that time was 8.7 (not investigated).  Hemoglobin has continued to decline, it is 7.0 today. Patient has dementia and gives a very limited history.  Most of his answers are yes or no without elaboration.  He does not have any GI complaints such as nausea, vomiting, bowel changes, blood in stool, dark stools. Mobic on home med list, patient doesn't know if he takes it but denies OTC anti-inflammatories. He denies ever having had a colonoscopy. Denies hx of PUD. Denies [facility] of colon cancer.    Past Medical History:   Diagnosis Date  . Acute encephalopathy 12/03/2017   [name] 12/04/2017  . High cholesterol   . Hypertension   . Stroke ([place])   . TIA (transient ischemic attack) 12/03/2017   [name] 12/04/2017    Past Surgical History:  Procedure Laterality Date  . LOOP RECORDER INSERTION N/A 12/07/2017   Procedure: LOOP RECORDER INSERTION;  Surgeon: Constance Haw, MD;  Location: [facility];  Service: Cardiovascular;  Laterality: N/A;  . NO PAST SURGERIES    . TEE WITHOUT CARDIOVERSION N/A 12/07/2017   Procedure: TRANSESOPHAGEAL ECHOCARDIOGRAM (TEE);  Surgeon: Dorothy Spark, MD;  Location: [facility] ENDOSCOPY;  Service: Cardiovascular;  Laterality: N/A;    Prior to Admission medications   Medication Sig Start Date End Date Taking? Authorizing Provider  acetaminophen (TYLENOL) 500 MG tablet Take 500-1,000 mg by mouth every 6 (six) hours as needed for mild pain.   Yes [provider]  alum & mag hydroxide-simeth (GERI-LANTA) 200-200-20 MG/5ML suspension Take 30 mLs by mouth every 6 (six) hours as needed for indigestion or heartburn.   Yes [provider]  atorvastatin (LIPITOR) 20 MG tablet Take 1 tablet (20 mg total) by mouth daily at 6 PM. 12/09/17 11/15/18 Yes Chundi, Vahini, MD  clopidogrel (PLAVIX) 75 MG tablet Take 75 mg by mouth daily.   Yes [provider]  donepezil (ARICEPT) 10 MG tablet Take 10 mg by mouth every morning.   Yes [provider]  ferrous sulfate 325 (65 FE) MG tablet Take 325 mg by mouth daily.    Yes [provider]  finasteride (PROSCAR) 5 MG tablet Take 5 mg by mouth daily.   Yes [provider]  guaifenesin (ROBITUSSIN) 100 MG/5ML syrup Take 200 mg by mouth every 6 (six) hours as needed for cough.   Yes [provider]  lisinopril-hydrochlorothiazide (PRINZIDE,ZESTORETIC) 20-12.5 MG tablet Take 1 tablet by mouth daily.   Yes [provider]  loperamide (IMODIUM) 2 MG capsule Take 2 mg by mouth daily  as needed for diarrhea or loose stools.   Yes [provider]  magnesium hydroxide (MILK OF MAGNESIA) 400 MG/5ML suspension Take 30 mLs by mouth at bedtime as needed for mild constipation.   Yes [provider]  neomycin-bacitracin-polymyxin (NEOSPORIN) ointment Apply 1 application topically daily as needed for wound care.   Yes [provider]  meloxicam (MOBIC) 7.5 MG tablet Take 1 tablet (7.5 mg total) by mouth daily. Patient not taking: Reported on 11/15/2018 06/23/17   Fransico Meadow, PA-C    Current Facility-Administered Medications  Medication Dose Route Frequency Provider Last Rate Last Dose  . 0.9 %  sodium chloride infusion   Intravenous Continuous Everrett Coombe, MD 75 mL/hr at 11/17/18 1024    . acetaminophen (TYLENOL) tablet 650 mg  650 mg Oral Q4H PRN Caroline More, DO       Or  . acetaminophen (TYLENOL) solution 650 mg  650 mg Per Tube Q4H PRN Caroline More, DO       Or  . acetaminophen (TYLENOL) suppository 650 mg  650 mg Rectal Q4H PRN Caroline More, DO      . aspirin EC tablet 81 mg  81 mg Oral Daily Burnetta Sabin L, NP   81 mg at 11/17/18 0849  . atorvastatin (LIPITOR) tablet 10 mg  10 mg Oral q1800 Donzetta Starch, NP   10 mg at 11/16/18 1830  . clopidogrel (PLAVIX) tablet 75 mg  75 mg Oral Daily Tammi Klippel, Sherin, DO   75 mg at 11/17/18 0849  . donepezil (ARICEPT) tablet 10 mg  10 mg Oral Loreli Dollar, Sherin, DO   10 mg at 11/17/18 0644  . ferrous sulfate tablet 325 mg  325 mg Oral Daily Caroline More, DO   325 mg at 11/17/18 0849  . finasteride (PROSCAR) tablet 5 mg  5 mg Oral Daily Meccariello, Bernita Raisin, DO   5 mg at 11/17/18 0848  . heparin injection 5,000 Units  5,000 Units Subcutaneous Q8H Caroline More, DO   5,000 Units at 11/17/18 6283  . pneumococcal 23 valent vaccine (PNU-IMMUNE) injection 0.5 mL  0.5 mL Intramuscular Tomorrow-1000 Martyn Malay, MD        Allergies as of 11/15/2018  . (No Known Allergies)    History  reviewed. No pertinent family history.  Social History   Socioeconomic History  . Marital status: Single    Spouse name: Not on file  . Number of children: Not on file  . Years of education: Not on file  . Highest education level: Not on file  Occupational History  . Not on file  Social Needs  . Financial resource strain: Not on file  . Food insecurity:    Worry: Not on file    Inability: Not on file  . Transportation needs:    Medical: Not on file    Non-medical: Not on file  Tobacco Use  . Smoking status: Current Every Day Smoker  Packs/day: 0.50    Years: 24.00    Pack years: 12.00    Types: Cigarettes  . Smokeless tobacco: Never Used  Substance and Sexual Activity  . Alcohol use: No    Frequency: Never  . Drug use: No  . Sexual activity: Not Currently  Lifestyle  . Physical activity:    Days per week: Not on file    Minutes per session: Not on file  . Stress: Not on file  Relationships  . Social connections:    Talks on phone: Not on file    Gets together: Not on file    Attends religious service: Not on file    Active member of club or organization: Not on file    Attends meetings of clubs or organizations: Not on file    Relationship status: Not on file  . Intimate partner violence:    Fear of current or ex partner: Not on file    Emotionally abused: Not on file    Physically abused: Not on file    Forced sexual activity: Not on file  Other Topics Concern  . Not on file  Social History Narrative  . Not on file    Review of Systems: All systems reviewed and negative except where noted in HPI.  Physical Exam: Vital signs in last 24 hours: Temp:  [98.4 F (36.9 C)-98.8 F (37.1 C)] 98.5 F (36.9 C) (04/29 1143) Pulse Rate:  [71-94] 93 (04/29 1143) Resp:  [17-18] 18 (04/29 1143) BP: (98-130)/(72-91) 113/88 (04/29 1143) SpO2:  [99 %-100 %] 100 % (04/29 0744) Weight:  [74.1 kg] 74.1 kg (04/29 0504) Last BM Date: 11/15/18 General:   Alert,  well-developed, male in NAD Psych:  Pleasant, cooperative. Normal mood and affect. Eyes:  Pupils equal, sclera clear, no icterus.   Conjunctiva pink. Ears:  Normal auditory acuity. Nose:  No deformity, discharge,  or lesions. Neck:  Supple; no masses Lungs:  Clear throughout to auscultation.   No wheezes, crackles, or rhonchi.  Heart:  Regular rate and rhythm; + murmur, no lower extremity edema Abdomen:  Soft, non-distended, nontender, BS active, no palp mass    Rectal:  Deferred  Msk:  Symmetrical without gross deformities. . Neurologic:  Alert and oriented to place, month. He can't recall the year Skin:  Intact without significant lesions or rashes.   Intake/Output from previous day: 04/28 0701 - 04/29 0700 In: 200 [P.O.:200] Out: 675 [Urine:675] Intake/Output this shift: Total I/O In: 240 [P.O.:240] Out: -   Lab Results: Recent Labs    11/16/18 1157 11/17/18 0501 11/17/18 1045  WBC 4.0 4.9 4.3  HGB 8.1* 7.0* 7.1*  HCT 25.6* 21.5* 22.3*  PLT 294 303 327   BMET Recent Labs    11/15/18 0256 11/15/18 0302 11/16/18 0417  NA 140 143 140  K 3.7 3.9 3.6  CL 104  --  108  CO2 28  --  25  GLUCOSE 122*  --  96  BUN 27*  --  23  CREATININE 1.75*  --  1.50*  CALCIUM 9.4  --  9.3   LFT Recent Labs    11/15/18 0256  PROT 6.2*  ALBUMIN 3.2*  AST 19  ALT 19  ALKPHOS 44  BILITOT 0.2*   PT/INR Recent Labs    11/15/18 0256  LABPROT 14.2  INR 1.1   Hepatitis Panel No results for input(s): HEPBSAG, HCVAB, HEPAIGM, HEPBIGM in the last 72 hours.   Studies/Results: No results found.   Tye Savoy,  NP-C @  11/17/2018, 1:20 PM    Attending physician's note   I have taken a history, examined the patient and reviewed the chart. I agree with the Advanced Practitioner's note, impression and recommendations.  63 year old male with history of recurrent CVA, most recent 2 days ago on dual antiplatelet therapy. He has chronic anemia and heme positive stool Slow  decline in hemoglobin could be possibly secondary to occult GI blood loss No overt GI bleeding or hemorrhage Given recent CVA, and the need for uninterrupted antiplatelet therapy will hold off endoscopic evaluation for atleast 12 weeks, will plan once cleared by neurology or sooner if he develops overt GI hemorrhage He also has an incidental spinal cord tumor, not a surgical candidate per neurosurgery Consider IV iron infusion ?  Goals of care discussion Follow up GI as outpatient.  Will sign off, available if have any questions   K. Denzil Magnuson , MD [phone]

## 2018-11-17 NOTE — Progress Notes (Signed)
Physical Therapy Treatment Patient Details Name: Karl Miller MRN: [id number] DOB: 06-01-1956 Today's Date: 11/17/2018    History of Present Illness Pt is a 63 y/o male presenting with L facial droop.  MRI reveals R basal ganglia infarct, lacunar infarcts in thalamus and brainstem, and incedental finding of 18mm posterior spinal mass at C4-5. PMH: dementia, (from ALF), HTN, CVA.     PT Comments    Pt progressing towards goals. Practiced forwards and backwards walking this session. Required min A for normal ambulation and mod A for taking backwards steps. Educated about seated HEP, and pt requiring multimodal cues to perform technique appropriately. Per CSW, ALF unable to provide 24/7 assist, therefore updated recommendations to SNF. Will continue to follow acutely to maximize functional mobility indpeendence and safety.    Follow Up Recommendations  SNF;Supervision/Assistance - 24 hour     Equipment Recommendations  None recommended by PT    Recommendations for Other Services       Precautions / Restrictions Precautions Precautions: Fall Restrictions Weight Bearing Restrictions: No    Mobility  Bed Mobility               General bed mobility comments: In chair upon entry.   Transfers Overall transfer level: Needs assistance Equipment used: Rolling walker (2 wheeled) Transfers: Sit to/from Stand Sit to Stand: Min assist         General transfer comment: Min A for steadying assist. Performed multiple times throughout session. Pt inconsistently responding to multimodal cues for safe hand placement. Would place hands on arm rests of chair to stand, however, pt would immediately move hands to RW and pull up using RW.   Ambulation/Gait Ambulation/Gait assistance: Min assist;Mod assist Gait Distance (Feet): 25 Feet(5' X5) Assistive device: Rolling walker (2 wheeled) Gait Pattern/deviations: Step-through pattern;Decreased stride length Gait velocity: Decreased     General Gait Details: Performed multiple trials of forwards and backwards walking. Pt continues to present with unsteadiness and decreased coordination. Min A for forwards walking and mod A for backwards walking    Stairs             Wheelchair Mobility    Modified Rankin (Stroke Patients Only) Modified Rankin (Stroke Patients Only) Pre-Morbid Rankin Score: Moderately severe disability Modified Rankin: Moderately severe disability     Balance Overall balance assessment: Needs assistance Sitting-balance support: No upper extremity supported;Feet supported Sitting balance-Leahy Scale: Fair     Standing balance support: Bilateral upper extremity supported;During functional activity Standing balance-Leahy Scale: Poor Standing balance comment: Reliant on BUE support              High level balance activites: Backward walking High Level Balance Comments: Posterior lean during backwards walking. Mod A for steadying and cues for safe use of RW.             Cognition Arousal/Alertness: Awake/alert Behavior During Therapy: WFL for tasks assessed/performed Overall Cognitive Status: History of cognitive impairments - at baseline                                 General Comments: Disoriented to time and situation. Poor awareness of safety and deficits and pt inconsistently following commands for safety during mobility.       Exercises General Exercises - Lower Extremity Long Arc Quad: AROM;Both;10 reps;Seated(verbal and tactile cues required) Hip Flexion/Marching: AROM;Both;10 reps;Seated(verbal and tactile cues required. )    General Comments  Pertinent Vitals/Pain Pain Assessment: No/denies pain    Home Living                      Prior Function            PT Goals (current goals can now be found in the care plan section) Acute Rehab PT Goals Patient Stated Goal: none stated PT Goal Formulation: Patient unable to participate  in goal setting Time For Goal Achievement: 11/30/18 Potential to Achieve Goals: Good Progress towards PT goals: Progressing toward goals    Frequency    Min 3X/week      PT Plan Discharge plan needs to be updated    Co-evaluation              AM-PAC PT "6 Clicks" Mobility   Outcome Measure  Help needed turning from your back to your side while in a flat bed without using bedrails?: A Little Help needed moving from lying on your back to sitting on the side of a flat bed without using bedrails?: A Little Help needed moving to and from a bed to a chair (including a wheelchair)?: A Little Help needed standing up from a chair using your arms (e.g., wheelchair or bedside chair)?: A Little Help needed to walk in hospital room?: A Little Help needed climbing 3-5 steps with a railing? : A Lot 6 Click Score: 17    End of Session Equipment Utilized During Treatment: Gait belt Activity Tolerance: Patient tolerated treatment well Patient left: in chair;with call bell/phone within reach;with chair alarm set Nurse Communication: Mobility status PT Visit Diagnosis: Unsteadiness on feet (R26.81);Muscle weakness (generalized) (M62.81)     Time: 5300-5110 PT Time Calculation (min) (ACUTE ONLY): 20 min  Charges:  $Gait Training: 8-22 mins                     Leighton Ruff, PT, DPT  Acute Rehabilitation Services  Pager: [phone] Office: [phone]    Rudean Hitt 11/17/2018, 12:19 PM

## 2018-11-17 NOTE — TOC Initial Note (Signed)
Transition of Care [facility]) - Initial/Assessment Note    Patient Details  Name: Karl Miller MRN: [id number] Date of Birth: 10-03-1955  Transition of Care [facility] 2) CM/SW Contact:    [name], LCSW Phone Number: 11/17/2018, 11:26 AM  Clinical Narrative:  CSW spoke with nurse manager at [facility] to discuss patient's current care needs. [facility] will be unable to increase care for the patient, he will need SNF at discharge prior to return to ALF. CSW spoke with patient's brother, brother is in agreement. Brother requesting [facility], as patient went there after his last stroke, but they are not accepting admissions at this time. CSW explained that to brother, and he is agreeable to faxing out referral to see what options are available. CSW to follow.                 Expected Discharge Plan: Skilled Nursing Facility Barriers to Discharge: Continued Medical Work up   Patient Goals and CMS Choice Patient states their goals for this hospitalization and ongoing recovery are:: patient unable to participate in goal setting CMS Medicare.gov Compare Post Acute Care list provided to:: Patient Represenative (must comment) Choice offered to / list presented to : Sibling  Expected Discharge Plan and Services Expected Discharge Plan: [place] Choice: [place] Living arrangements for the past 2 months: [place]                                      Prior Living Arrangements/Services Living arrangements for the past 2 months: [place] Lives with:: Self, Facility Resident          Need for Family Participation in Patient Care: Yes (Comment) Care giver support system in place?: No (comment)      Activities of Daily Living Home Assistive Devices/Equipment: Cane (specify quad or straight) ADL Screening (condition at time of admission) Patient's cognitive ability adequate to safely  complete daily activities?: Yes Is the patient deaf or have difficulty hearing?: No Does the patient have difficulty seeing, even when wearing glasses/contacts?: No Does the patient have difficulty concentrating, remembering, or making decisions?: No Patient able to express need for assistance with ADLs?: Yes Does the patient have difficulty dressing or bathing?: Yes Independently performs ADLs?: Yes (appropriate for developmental age) Does the patient have difficulty walking or climbing stairs?: Yes Weakness of Legs: Both Weakness of Arms/Hands: Right  Permission Sought/Granted                  Emotional Assessment Appearance:: Appears stated age Attitude/Demeanor/Rapport: Unable to Assess Affect (typically observed): Unable to Assess Orientation: : Oriented to Self, Oriented to Place, Oriented to Situation Alcohol / Substance Use: Not Applicable Psych Involvement: No (comment)  Admission diagnosis:  Cerebral infarction, unspecified mechanism ([place]) [I63.9] CVA (cerebral vascular accident) [facility]) [I63.9] Patient Active Problem List   Diagnosis Date Noted  . CVA (cerebral vascular accident) ([place]) 11/15/2018  . Benign essential HTN   . Tobacco abuse   . TIA (transient ischemic attack)   . Diastolic dysfunction   . Prediabetes   . Acute blood loss anemia   . Stage 3 chronic kidney disease ([place])   . Hyperlipidemia   . Syphilis   . Cerebral embolism with cerebral infarction 12/05/2017  . Smoker   . Acute encephalopathy 12/04/2017  . HTN (hypertension) 12/04/2017  . AKI (acute  kidney injury) ([place]) 12/04/2017  . Elevated PSA 12/04/2017  . Rhabdomyolysis 12/04/2017   PCP:  Administration, Veterans Pharmacy:   [id number] [address], [place] [address] Phone: [phone] Fax: [phone]  [place], [place] - [address] [place] [phone] [place] [address] Phone: [phone] Fax: [phone]     Social Determinants of Health (SDOH) Interventions    Readmission Risk Interventions No flowsheet data found.

## 2018-11-18 LAB — BASIC METABOLIC PANEL
Anion gap: 4 — ABNORMAL LOW (ref 5–15)
BUN: 21 mg/dL (ref 8–23)
CO2: 27 mmol/L (ref 22–32)
Calcium: 9.3 mg/dL (ref 8.9–10.3)
Chloride: 111 mmol/L (ref 98–111)
Creatinine, Ser: 1.36 mg/dL — ABNORMAL HIGH (ref 0.61–1.24)
GFR calc Af Amer: >60 mL/min (ref >60)
GFR calc non Af Amer: 54 mL/min — ABNORMAL LOW (ref >60)
Glucose, Bld: 88 mg/dL (ref 70–99)
Potassium: 4.2 mmol/L (ref 3.5–5.1)
Sodium: 142 mmol/L (ref 135–145)

## 2018-11-18 LAB — CBC
HCT: 21.5 % — ABNORMAL LOW (ref 39.0–52.0)
HCT: 29.5 % — ABNORMAL LOW (ref 39.0–52.0)
Hemoglobin: 6.7 g/dL — CL (ref 13.0–17.0)
Hemoglobin: 9.4 g/dL — ABNORMAL LOW (ref 13.0–17.0)
MCH: 29.5 pg (ref 26.0–34.0)
MCH: 29.9 pg (ref 26.0–34.0)
MCHC: 31.2 g/dL (ref 30.0–36.0)
MCHC: 31.9 g/dL (ref 30.0–36.0)
MCV: 94.7 fL (ref 80.0–100.0)
MCV: 93.9 fL (ref 80.0–100.0)
Platelets: 306 10*3/uL (ref 150–400)
Platelets: 342 10*3/uL (ref 150–400)
RBC: 2.27 MIL/uL — ABNORMAL LOW (ref 4.22–5.81)
RBC: 3.14 MIL/uL — ABNORMAL LOW (ref 4.22–5.81)
RDW: 15.2 % (ref 11.5–15.5)
RDW: 15.4 % (ref 11.5–15.5)
WBC: 6 10*3/uL (ref 4.0–10.5)
WBC: 6.2 10*3/uL (ref 4.0–10.5)
nRBC: 0.3 % — ABNORMAL HIGH (ref 0.0–0.2)
nRBC: 0 % (ref 0.0–0.2)

## 2018-11-18 LAB — ABO/RH: ABO/RH(D): O NEG

## 2018-11-18 LAB — PREPARE RBC (CROSSMATCH)

## 2018-11-18 MED ORDER — SODIUM CHLORIDE 0.9% IV SOLUTION
Freq: Once | INTRAVENOUS | Status: AC
  Administered 2018-11-18: 11:00:00 via INTRAVENOUS

## 2018-11-18 NOTE — Progress Notes (Signed)
Pt's Hgb is 6.7g/dl at this time, informed oncall  Physician Do. Enid Derry, [place]; no new order received at this moment.

## 2018-11-18 NOTE — Plan of Care (Signed)
  Problem: Education: Goal: Knowledge of disease or condition will improve Outcome: Progressing Goal: Knowledge of secondary prevention will improve Outcome: Progressing Goal: Knowledge of patient specific risk factors addressed and post discharge goals established will improve Outcome: Progressing   Problem: Self-Care: Goal: Ability to participate in self-care as condition permits will improve Outcome: Progressing   Problem: Nutrition: Goal: Risk of aspiration will decrease Outcome: Progressing   Problem: Ischemic Stroke/TIA Tissue Perfusion: Goal: Complications of ischemic stroke/TIA will be minimized Outcome: Progressing   Problem: Ischemic Stroke/TIA Tissue Perfusion: Goal: Complications of ischemic stroke/TIA will be minimized Outcome: Progressing

## 2018-11-18 NOTE — TOC Progression Note (Signed)
Transition of Care [facility]) - Progression Note    Patient Details  Name: Karl Miller MRN: [id number] Date of Birth: [birth year]  Transition of Care [facility]) CM/SW [place], [place] Phone Number: 11/18/2018, 10:37 AM  Clinical Narrative: CSW confirmed with brother that he would like to select [facility]. Bed is available for patient when he is medically stable.      Expected Discharge Plan: [place] Barriers to Discharge: Continued Medical Work up  Expected Discharge Plan and Services Expected Discharge Plan: [place] Choice: [place] arrangements for the past 2 months: Assisted Living Facility                                       Social Determinants of Health (SDOH) Interventions    Readmission Risk Interventions No flowsheet data found.

## 2018-11-18 NOTE — Progress Notes (Addendum)
Family Medicine Teaching Service Daily Progress Note Intern Pager: [phone]  Patient name: Karl Miller Medical record number: [id number] Date of birth: 1956-03-26 Age: 63 y.o. Gender: male  Primary Care Provider: Administration, [facility] Consultants: Neurology Code Status: FULL  Pt Overview and Major Events to Date:  4/27 admit to FPTS for new L Facial droop, CVA  Assessment and Plan: Karl Miller is a 63 y.o. male presenting with facial droop . 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 Neuro following. Per neuro, hypotension is most likely precipitant of presentation.  Continues to have Left sided facial droop, otherwise neurologically intact. - monitor on tele - stroke team signed off - PT/OT: SNF, agreeable, SW consulted - orthostatic vitals positive from sitting to standing; holding BP meds currently - cont atorvastatin 10mg  per neuro recs - cont ASA, plavix, DAPT x 3 wks then plavix only - Cards interrogated loop recorder in shadow chart: No Afib  Anemia, acute on chronic Initially thought to be 2/2 CKD. BL Hgb uncertain; per chart review Hgb 8.7 on 10/28/2018 but previously normal (11-12) in 11/2017. Today Hgb 7.1>6.7.  VSS.  GI has recommended outpatient workup and possible IV iron.   Iron 242 s/p daily repletion, TIBC WNL at 336.  Would opt to hold off on IV iron at this time given oral supplementation, will received some with transfusion and iron is WNL. 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 Strength and sensation intact. Neurosurgery Dr. Arnoldo Morale following. Likely pt not a good  surgical candidate - follow up with neurosurgery as outpt  Dementia uncertain baseline. Waxing and waning mentation is worst in the evening, consistent with sundowning. - continue home donepezil  HTN  Home meds:prinzide (lisinopril HCTZ 20-12.5mg ) daily.  BP this AM 126/82. -holding home meds in setting of hypotension, will hold on d/c as well given stroke likely 2/2 hypotension  -restart as BP tolerates   HLD Home meds: atorvastatin 20mg  -continue home meds  HFrEF Echo from 11/2017 showing LVEF 45-50%. Unclear dry weight. Euvolemic on exam  -monitor Is and Os -daily weights.   Acute CKD3a AKI improved with fluids. Cr 1.5 at baseline. Cr this AM 1.36. - monitor Cr on AM BMP  BPH Home meds: finasteride. - continue home finasteride  FEN/GI: Heart Healthy, carb modified Prophylaxis: ASA and Plavix only given acute anemia  Disposition: PT/OT: recommend SNF, CSW consulted, likely 5/1 pending bed placement and stable Hgb  Subjective:  Patient denies any complaints this AM.  Denies CP, SOB, dizziness.  Understands that he will need a blood transfusion.  Denies rectal bleeding.  Objective: Temp:  [98 F (36.7 C)-98.9 F (37.2 C)] 98 F (36.7 C) (04/30 0414) Pulse Rate:  [60-93] 60 (04/30 0414) Resp:  [17-18] 17 (04/30 0414) BP: (113-137)/(78-90) 126/82 (04/30 0414) SpO2:  [100 %] 100 % (04/30 0414) Weight:  [73.4 kg] 73.4 kg (04/30 0500)  Physical Exam: General: 63 y.o. male in NAD, lying in bed HEENT: conjunctival palor Cardio: RRR no m/r/g Lungs: CTAB, no wheezing, no rhonchi, no crackles, no increased WOB on RA Abdomen: Soft, non-tender to palpation, positive bowel sounds Skin:  warm and dry Extremities: No edema Neuro: left sided facial droop, CN II-XII otherwise grossly intact   Laboratory: Recent Labs  Lab 11/17/18 0501 11/17/18 1045 11/18/18 0347  WBC 4.9 4.3 6.0  HGB 7.0* 7.1* 6.7*  HCT 21.5* 22.3* 21.5*  PLT 303 327 306   Recent Labs  Lab  11/15/18 0256 11/15/18 0302 11/16/18 0417  NA 140 143 140  K 3.7 3.9 3.6  CL 104  --  108  CO2 28  --  25  BUN 27*  --  23  CREATININE 1.75*  --  1.50*  CALCIUM 9.4  --  9.3  PROT 6.2*  --   --   BILITOT 0.2*  --   --   ALKPHOS 44  --   --   ALT 19  --   --   AST 19  --   --   GLUCOSE 122*  --  96   Imaging/Diagnostic Tests: No results found.  Karl Miller, Karl Raisin, DO 11/18/2018, 7:51 AM PGY-1, [place] Intern pager: [phone], text pages welcome

## 2018-11-18 NOTE — Discharge Summary (Signed)
[facility] Discharge Summary  Patient name: Karl Miller Medical record number: [id number] Date of birth: [birth year] Age: 63 y.o. Gender: male Date of Admission: 11/15/2018  Date of Discharge: 11/19/2018 Admitting Physician: Martyn Malay, MD  Primary Care Provider: Administration, [facility] Consultants: Neuro, GI, Neurosurgery  Indication for Hospitalization: New CVA  Discharge Diagnoses/Problem List:  New CVA Anemia, acute on chronic C4-5 spinal mass Dementia Hypertension HLD HFrEF Acute CKD 3 BPH  Disposition: SNF  Discharge Condition: stable  Discharge Exam:   Physical Exam: General: 63 y.o. male in NAD, sitting up in chair Cardio: RRR no m/r/g Lungs: CTAB, no wheezing, no rhonchi, no crackles, no IWOB on RA Abdomen: Soft, non-tender to palpation, positive bowel sounds Skin: warm and dry Extremities: No edema Neuro: stable left facial droop otherwise CN II-XII intact, 5/5 strength BUE/BLE, sensation intact throughout   Brief Hospital Course:  Karl Miller a 63 y.o.malepresenting with facial droop. PMH is significant for HTN, CKD3, tobacco use, HLD.  His hospital course is outlined below.  New CVA Admission details can be found in H&P.  On MRI, patient found to have multiple acute nonhemorrhagic infarcts and chronic microvascular ischemia.  Neuro consulted and believed likely precipitated by hypotension given distribution.  Patient was started on DAPT with ASA and Plavix x 3 weeks, then he will continue with Plavix only.  Neuro also recommended continuing patient's atorvastatin at 10mg  given his low LDL.  Cardiology interrogated his loop recorder which did not show evidence of A Fib.  At the time of discharge, patient remained with stable left sided facial droop, but was otherwise neurologically intact.  He will f/u with Stroke Clinic in 6 weeks as outpatient.  C4-5 spinal mass Incidental finding on CT.  Neurosurgeru noted that  patient was not a good surgical candidate.  Plan to follow up outpatient following stroke recovery.  Anemia Per chart review patient with previously normal Hgb in 11/2017 (11-12).  On 4/9, patient had been noted to have Hgb 8.7,.  It trended down during his admission and GI was consulted, who recommended outpatient follow up.  Patient's Hgb did trend down to below his transfusion threshold of 7, on 4/30 when his Hgb was 6.7.  He was transfused 1 unit pRBC and increased to 9.4.  Patient had iron WNL on oral iron and therefore was not given IV iron.  At the time of discharge, patient's Hgb was 8.1, vitals were stable, and there was no evidence of active bleeding.  Issues for Follow Up:  1. Patient will need to follow up with GI as outpatient for anemia.  Hgb on d/c was 8.1. 2. Patient will continue on ASA and Plavix x 3 weeks from 4/27, then plavix alone. 3. Patient will f/u in stroke clinic in 6 weeks. 4. Ensure that patient follows up with neurosurgeon, Dr. Arnoldo Morale, following discharge. 5. HTN meds held on discharge as hypotension thought to be cause of patient's stroke.  Recommend liberal control of BP.    Significant Procedures: 4/30 1 unit pRBC   Significant Labs and Imaging:  Recent Labs  Lab 11/18/18 0347 11/18/18 1639 11/19/18 0339  WBC 6.0 6.2 7.2  HGB 6.7* 9.4* 8.1*  HCT 21.5* 29.5* 25.3*  PLT 306 342 312   Recent Labs  Lab 11/15/18 0256 11/15/18 0302 11/16/18 0417 11/18/18 0815 11/19/18 0339  NA 140 143 140 142 141  K 3.7 3.9 3.6 4.2 4.3  CL 104  --  108 111 110  CO2 28  --  25 27 22   GLUCOSE 122*  --  96 88 87  BUN 27*  --  23 21 24*  CREATININE 1.75*  --  1.50* 1.36* 1.25*  CALCIUM 9.4  --  9.3 9.3 9.4  ALKPHOS 44  --   --   --   --   AST 19  --   --   --   --   ALT 19  --   --   --   --   ALBUMIN 3.2*  --   --   --   --     Mr Karl Miller Head Wo Contrast  Result Date: 11/15/2018 CLINICAL DATA:  Muscle weakness, generalized.  Fall. EXAM: MRI HEAD WITHOUT CONTRAST  MRA HEAD WITHOUT CONTRAST TECHNIQUE: Multiplanar, multiecho pulse sequences of the brain and surrounding structures were obtained without intravenous contrast. Angiographic images of the head were obtained using MRA technique without contrast. COMPARISON:  CT head without contrast 11/15/2018. MRI brain 12/04/2017 FINDINGS: MRI HEAD FINDINGS Brain: Acute nonhemorrhagic infarct involves the right centrum semi ovale. Patient has multiple bilateral remote lacunar infarcts in a similar location. Remote lacunar infarcts also involves the basal ganglia bilaterally. There is no hemorrhage. Remote lacunar infarcts of the thalami bilaterally are stable. A remote lacunar infarct is again noted in the pons. A remote left PICA territory infarct is stable. Moderate diffuse white matter disease is otherwise stable bilaterally. Advanced atrophy is noted. Vascular: Flow is present in the major intracranial arteries. Skull and upper cervical spine: Craniocervical junction is normal. There is decreased marrow signal in the upper cervical spine. This is new since the prior study. A posterior spinal mass is present at C4-5. This appears to have mass effect on the posterior aspect of the spinal cord. No other focal lesions are present. The Sinuses/Orbits: Mild mucosal thickening is present inferiorly in the left maxillary sinus. The paranasal sinuses and mastoid air cells are otherwise clear. The globes and orbits are within normal limits. MRA HEAD FINDINGS The internal carotid arteries are within normal limits from the high cervical segments through the ICA terminus bilaterally. The A1 and M1 segments are normal. No definite anterior communicating artery is present. MCA bifurcations are within normal limits. The ACA and MCA branch vessels are within normal limits. There is a moderate focal stenosis of the dominant left vertebral artery at the dural margin. The left PICA is just above this level. Right PICA is within normal limits.  Vertebrobasilar junction is normal. Both posterior cerebral arteries originate from basilar tip. There is some attenuation of distal PCA branch vessels. IMPRESSION: 1. Acute nonhemorrhagic lacunar infarct involving the right basal ganglia and centrum semi ovale. 2. Multiple remote nonhemorrhagic lacunar infarcts involving the basal ganglia and centrum semi ovale bilaterally. 3. Remote lacunar infarcts of the thalami and brainstem. 4. Remote left PICA territory infarct. 5. Advanced atrophy and white matter disease, consistent with chronic microvascular ischemia. 6. 10 mm posterior spinal mass at the C4-5 level may create mass effect on the cervical spinal cord. Recommend MRI of the cervical spine without and with contrast. The patient can receive half dose contrast due to renal insufficiency. 7. Decreased marrow signal in the upper cervical spine. This is likely related to kidney disease. 8. Moderate stenosis of the proximal left vertebral artery does not appear to be flow limiting. 9. Mild distal small vessel disease without other significant proximal stenosis, aneurysm, or branch vessel occlusion within the circle-of-Willis. Electronically Signed   By: Wynetta Fines.D.  On: 11/15/2018 05:43   Mr Brain Wo Contrast  Result Date: 11/15/2018 CLINICAL DATA:  Muscle weakness, generalized.  Fall. EXAM: MRI HEAD WITHOUT CONTRAST MRA HEAD WITHOUT CONTRAST TECHNIQUE: Multiplanar, multiecho pulse sequences of the brain and surrounding structures were obtained without intravenous contrast. Angiographic images of the head were obtained using MRA technique without contrast. COMPARISON:  CT head without contrast 11/15/2018. MRI brain 12/04/2017 FINDINGS: MRI HEAD FINDINGS Brain: Acute nonhemorrhagic infarct involves the right centrum semi ovale. Patient has multiple bilateral remote lacunar infarcts in a similar location. Remote lacunar infarcts also involves the basal ganglia bilaterally. There is no hemorrhage.  Remote lacunar infarcts of the thalami bilaterally are stable. A remote lacunar infarct is again noted in the pons. A remote left PICA territory infarct is stable. Moderate diffuse white matter disease is otherwise stable bilaterally. Advanced atrophy is noted. Vascular: Flow is present in the major intracranial arteries. Skull and upper cervical spine: Craniocervical junction is normal. There is decreased marrow signal in the upper cervical spine. This is new since the prior study. A posterior spinal mass is present at C4-5. This appears to have mass effect on the posterior aspect of the spinal cord. No other focal lesions are present. The Sinuses/Orbits: Mild mucosal thickening is present inferiorly in the left maxillary sinus. The paranasal sinuses and mastoid air cells are otherwise clear. The globes and orbits are within normal limits. MRA HEAD FINDINGS The internal carotid arteries are within normal limits from the high cervical segments through the ICA terminus bilaterally. The A1 and M1 segments are normal. No definite anterior communicating artery is present. MCA bifurcations are within normal limits. The ACA and MCA branch vessels are within normal limits. There is a moderate focal stenosis of the dominant left vertebral artery at the dural margin. The left PICA is just above this level. Right PICA is within normal limits. Vertebrobasilar junction is normal. Both posterior cerebral arteries originate from basilar tip. There is some attenuation of distal PCA branch vessels. IMPRESSION: 1. Acute nonhemorrhagic lacunar infarct involving the right basal ganglia and centrum semi ovale. 2. Multiple remote nonhemorrhagic lacunar infarcts involving the basal ganglia and centrum semi ovale bilaterally. 3. Remote lacunar infarcts of the thalami and brainstem. 4. Remote left PICA territory infarct. 5. Advanced atrophy and white matter disease, consistent with chronic microvascular ischemia. 6. 10 mm posterior spinal  mass at the C4-5 level may create mass effect on the cervical spinal cord. Recommend MRI of the cervical spine without and with contrast. The patient can receive half dose contrast due to renal insufficiency. 7. Decreased marrow signal in the upper cervical spine. This is likely related to kidney disease. 8. Moderate stenosis of the proximal left vertebral artery does not appear to be flow limiting. 9. Mild distal small vessel disease without other significant proximal stenosis, aneurysm, or branch vessel occlusion within the circle-of-Willis. Electronically Signed   By: San Morelle M.D.   On: 11/15/2018 05:43   Mr Cervical Spine W Wo Contrast  Result Date: 11/15/2018 CLINICAL DATA:  Follow-up to abnormality seen on brain MRI. Generalized muscle weakness with fall. EXAM: MRI CERVICAL SPINE WITHOUT AND WITH CONTRAST TECHNIQUE: Multiplanar and multiecho pulse sequences of the cervical spine, to include the craniocervical junction and cervicothoracic junction, were obtained without and with intravenous contrast. CONTRAST:  Gadavist 10 mL. COMPARISON:  MR brain earlier in the day.  CTA head neck 12/04/2017. FINDINGS: Significant motion degradation. The study is of borderline diagnostic utility. Alignment: Physiologic. Vertebrae: No fracture,  evidence of discitis, or bone lesion. 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 The lesion measures roughly 8 x 8 x 12 mm. The mass demonstrates intermediate T1, decreased T2, and increased STIR intensity. There is avid homogeneous postcontrast enhancement. Abnormal cord signal is difficult to confirm or exclude. There is significant spinal stenosis and displacement of the spinal cord RIGHT-to-LEFT. Differential considerations include meningioma versus schwannoma. Posterior Fossa, vertebral arteries, paraspinal tissues: Posterior fossa unremarkable. Vertebral flow voids are maintained. No definite  neck masses. Disc levels: Assessment of individual levels or disc pathology is limited. Disc space narrowing is most pronounced at the C6-7 level, where there appears to be a shallow protrusion. Compared with the previous CT study from May 2019, the mass was present but difficult to see. 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 Differential considerations include meningioma versus schwannoma. Neurosurgical consultation is warranted. Motion degradation makes assessment of cord edema problematic. Multilevel spondylosis appears most prominent at C6-C7. Electronically Signed   By: Staci Righter M.D.   On: 11/15/2018 12:56   Ct Head Code Stroke Wo Contrast  Result Date: 11/15/2018 CLINICAL DATA:  Code stroke. 63 year old male with left facial droop. EXAM: CT HEAD WITHOUT CONTRAST TECHNIQUE: Contiguous axial images were obtained from the base of the skull through the vertex without intravenous contrast. COMPARISON:  Brain MRI, CTA head and neck 12/04/2017. FINDINGS: Brain: Chronic infarcts with encephalomalacia in the left cerebellum, right occipital lobe. Widespread Patchy and confluent cerebral white matter and bilateral deep gray matter hypodensity. Stable gray-white matter differentiation throughout the brain since 2019. No midline shift, ventriculomegaly, mass effect, evidence of mass lesion, intracranial hemorrhage or evidence of cortically based acute infarction. Vascular: Calcified atherosclerosis at the skull base. No suspicious intracranial vascular hyperdensity. Skull: No acute osseous abnormality identified. Sinuses/Orbits: Visualized paranasal sinuses and mastoids are stable and well pneumatized. Other: No acute orbit or scalp soft tissue findings. ASPECTS [facility] Stroke Program Early CT Score) - Ganglionic level infarction (caudate, lentiform nuclei, internal  capsule, insula, M1-M3 cortex): 7 - Supraganglionic infarction (M4-M6 cortex): 3 Total score (0-10 with 10 being normal): 10 (chronic encephalomalacia) IMPRESSION: 1. Extensive chronic ischemic disease appears stable by CT since 2019. 2. No acute intracranial hemorrhage or cortically based infarct identified. 3. ASPECTS is 10. 4. These results were communicated to Dr. Cheral Marker at 3:14 am on 11/15/2018 by text page via the [facility] messaging system. Electronically Signed   By: Genevie Ann M.D.   On: 11/15/2018 03:14   Results/Tests Pending at Time of Discharge: None  Discharge Medications:  Allergies as of 11/19/2018   No Known Allergies     Medication List    STOP taking these medications   Geri-Lanta 200-200-20 MG/5ML suspension Generic drug:  alum & mag hydroxide-simeth   guaifenesin 100 MG/5ML syrup Commonly known as:  ROBITUSSIN   lisinopril-hydrochlorothiazide 20-12.5 MG tablet Commonly known as:  ZESTORETIC   loperamide 2 MG capsule Commonly known as:  IMODIUM   magnesium hydroxide 400 MG/5ML suspension Commonly known as:  MILK OF MAGNESIA   meloxicam 7.5 MG tablet Commonly known as:  Mobic   neomycin-bacitracin-polymyxin ointment Commonly known as:  NEOSPORIN     TAKE these medications   acetaminophen 500 MG tablet Commonly known as:  TYLENOL Take 500-1,000 mg by mouth every 6 (six) hours as needed for mild pain.  aspirin 81 MG EC tablet Take 1 tablet (81 mg total) by mouth daily for 18 days.   atorvastatin 10 MG tablet Commonly known as:  LIPITOR Take 1 tablet (10 mg total) by mouth daily at 6 PM. What changed:    medication strength  how much to take   clopidogrel 75 MG tablet Commonly known as:  PLAVIX Take 75 mg by mouth daily.   donepezil 10 MG tablet Commonly known as:  ARICEPT Take 10 mg by mouth every morning.   ferrous sulfate 325 (65 FE) MG tablet Take 325 mg by mouth daily.   finasteride 5 MG tablet Commonly known as:  PROSCAR Take 5 mg by mouth  daily.       Discharge Instructions: Please refer to Patient Instructions section of EMR for full details.  Patient was counseled important signs and symptoms that should prompt return to medical care, changes in medications, dietary instructions, activity restrictions, and follow up appointments.   Follow-Up Appointments: Follow-up [facility]. Schedule an appointment as soon as possible for a visit.   Specialty:  Gastroenterology Contact information: [place] [address] [phone]       Administration, [place]. Schedule an appointment as soon as possible for a visit in 1 week(s).   Contact information: [place] [address] [phone]        Garvin Fila, MD. Call.   Specialties:  Neurology, Radiology Why:  to ensure appointment is made for 6 weeks. Contact information: [address] [place] [address] [phone]        Newman Pies, MD. Call.   Specialty:  Neurosurgery Why:  to schedule appointment for follow up Contact information: [address] [place] [address] [phone]           Cleophas Dunker, DO 11/19/2018, 9:18 AM PGY-1, [place]

## 2018-11-19 LAB — TYPE AND SCREEN
ABO/RH(D): O NEG
Antibody Screen: NEGATIVE
Unit division: 0

## 2018-11-19 LAB — CBC
HCT: 25.3 % — ABNORMAL LOW (ref 39.0–52.0)
Hemoglobin: 8.1 g/dL — ABNORMAL LOW (ref 13.0–17.0)
MCH: 30.2 pg (ref 26.0–34.0)
MCHC: 32 g/dL (ref 30.0–36.0)
MCV: 94.4 fL (ref 80.0–100.0)
Platelets: 312 10*3/uL (ref 150–400)
RBC: 2.68 MIL/uL — ABNORMAL LOW (ref 4.22–5.81)
RDW: 15.9 % — ABNORMAL HIGH (ref 11.5–15.5)
WBC: 7.2 10*3/uL (ref 4.0–10.5)
nRBC: 0.3 % — ABNORMAL HIGH (ref 0.0–0.2)

## 2018-11-19 LAB — BASIC METABOLIC PANEL
Anion gap: 9 (ref 5–15)
BUN: 24 mg/dL — ABNORMAL HIGH (ref 8–23)
CO2: 22 mmol/L (ref 22–32)
Calcium: 9.4 mg/dL (ref 8.9–10.3)
Chloride: 110 mmol/L (ref 98–111)
Creatinine, Ser: 1.25 mg/dL — ABNORMAL HIGH (ref 0.61–1.24)
GFR calc Af Amer: >60 mL/min (ref >60)
GFR calc non Af Amer: >60 mL/min (ref >60)
Glucose, Bld: 87 mg/dL (ref 70–99)
Potassium: 4.3 mmol/L (ref 3.5–5.1)
Sodium: 141 mmol/L (ref 135–145)

## 2018-11-19 LAB — BPAM RBC
Blood Product Expiration Date: 202005042359
ISSUE DATE / TIME: 202004301130
Unit Type and Rh: 9500

## 2018-11-19 LAB — PATHOLOGIST SMEAR REVIEW

## 2018-11-19 MED ORDER — ATORVASTATIN CALCIUM 10 MG PO TABS: 10.0000 mg | ORAL_TABLET | Freq: Every day | ORAL | 0 refills | Status: DC

## 2018-11-19 MED ORDER — ASPIRIN 81 MG PO TBEC: 81.0000 mg | DELAYED_RELEASE_TABLET | Freq: Every day | ORAL | 0 refills | Status: AC

## 2018-11-19 NOTE — TOC Transition Note (Signed)
Transition of Care [facility]) - CM/SW Discharge Note   Patient Details  Name: Karl Miller MRN: [id number] Date of Birth: [birth year]  [facility] CM/SW Contact:  [name], LCSW Phone Number: 11/19/2018, 11:08 AM   Clinical Narrative:  Nurse to call report to [phone], Room 3234     Final next level of care: Skilled Nursing Facility Barriers to Discharge: No Barriers Identified   Patient Goals and CMS Choice Patient states their goals for this hospitalization and ongoing recovery are:: patient unable to participate in goal setting CMS Medicare.gov Compare Post Acute Care list provided to:: Patient Represenative (must comment) Choice offered to / list presented to : Sibling  Discharge Placement              Patient chooses bed at: [facility] Patient to be transferred to facility by: [place] Name of family member notified: [name] Patient and family notified of of transfer: 11/19/18  Discharge Plan and Services     Post Acute Care Choice: [place]                               Social Determinants of Health (SDOH) Interventions     Readmission Risk Interventions No flowsheet data found.

## 2018-11-19 NOTE — Discharge Instructions (Signed)
Karl Miller was admitted to the hospital for a new stroke.  He was also found to have anemia and received a blood transfusion.    He should follow up with GI as an outpatient.    He will continue Aspirin and Plavix for 3 weeks, then will only take Plavix by itself daily.  He should follow up with the [facility] in 6 weeks.  He should also make an appointment to be seen by his regular doctor for blood work within a week from discharge.

## 2018-11-19 NOTE — Progress Notes (Signed)
Physical Therapy Treatment Patient Details Name: Karl Miller MRN: [id number] DOB: [birth year] Today's Date: 11/19/2018    History of Present Illness Pt is a 63 y/o male presenting with L facial droop.  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 PMH: dementia, (from ALF), HTN, CVA.     PT Comments    Pt progressing towards goals. Tolerated short distance ambulation in hallway, however, continues to present with unsteadiness and decreased coordination. Required min to mod A for steadying. Current recommendations appropriate. Will continue to follow acutely to maximize functional mobility independence and safety.    Follow Up Recommendations  SNF;Supervision/Assistance - 24 hour     Equipment Recommendations  None recommended by PT    Recommendations for Other Services       Precautions / Restrictions Precautions Precautions: Fall Restrictions Weight Bearing Restrictions: No    Mobility  Bed Mobility               General bed mobility comments: In chair upon entry.   Transfers Overall transfer level: Needs assistance Equipment used: Rolling walker (2 wheeled) Transfers: Sit to/from Stand Sit to Stand: Min assist         General transfer comment: Min A for steadying assist. PT placed pt hands on armrests, however, immediately before standing pt reaching for RW and pulling up from RW. PT braced RW for increased stability.   Ambulation/Gait Ambulation/Gait assistance: Min assist;Mod assist Gait Distance (Feet): 40 Feet Assistive device: Rolling walker (2 wheeled) Gait Pattern/deviations: Step-through pattern;Decreased stride length;Narrow base of support Gait velocity: Decreased    General Gait Details: Slow, unsteady gait. Pt ambulated short distance in hall. Required safety cues with use of RW, as pt tended to pick RW up especially when turning. Gave cues to widen  BOS, however, pt not responding. Also noted decreased coordination.    Stairs             Wheelchair Mobility    Modified Rankin (Stroke Patients Only) Modified Rankin (Stroke Patients Only) Pre-Morbid Rankin Score: Moderately severe disability Modified Rankin: Moderately severe disability     Balance Overall balance assessment: Needs assistance Sitting-balance support: No upper extremity supported;Feet supported Sitting balance-Leahy Scale: Fair     Standing balance support: Bilateral upper extremity supported;During functional activity Standing balance-Leahy Scale: Poor Standing balance comment: Reliant on BUE support                             Cognition Arousal/Alertness: Awake/alert Behavior During Therapy: WFL for tasks assessed/performed Overall Cognitive Status: History of cognitive impairments - at baseline                                 General Comments: Disoriented to time this session. Pt requiring safety cues throughout mobility.       Exercises General Exercises - Lower Extremity Hip Flexion/Marching: AROM;Both;5 reps;Standing(min A for steadying with use of RW)    General Comments        Pertinent Vitals/Pain Pain Assessment: No/denies pain    Home Living                      Prior Function            PT Goals (current goals can now be found in the care plan  section) Acute Rehab PT Goals Patient Stated Goal: none stated PT Goal Formulation: Patient unable to participate in goal setting Time For Goal Achievement: 11/30/18 Potential to Achieve Goals: Good Progress towards PT goals: Progressing toward goals    Frequency    Min 3X/week      PT Plan Current plan remains appropriate    Co-evaluation              AM-PAC PT "6 Clicks" Mobility   Outcome Measure  Help needed turning from your back to your side while in a flat bed without using bedrails?: A Little Help needed moving from  lying on your back to sitting on the side of a flat bed without using bedrails?: A Little Help needed moving to and from a bed to a chair (including a wheelchair)?: A Little Help needed standing up from a chair using your arms (e.g., wheelchair or bedside chair)?: A Little Help needed to walk in hospital room?: A Lot Help needed climbing 3-5 steps with a railing? : A Lot 6 Click Score: 16    End of Session Equipment Utilized During Treatment: Gait belt Activity Tolerance: Patient tolerated treatment well Patient left: in chair;with call bell/phone within reach;with chair alarm set Nurse Communication: Mobility status PT Visit Diagnosis: Unsteadiness on feet (R26.81);Muscle weakness (generalized) (M62.81)     Time: 0945-1000 PT Time Calculation (min) (ACUTE ONLY): 15 min  Charges:  $Gait Training: 8-22 mins                     Leighton Ruff, PT, DPT  Acute Rehabilitation Services  Pager: [phone] Office: [phone]    Rudean Hitt 11/19/2018, 12:24 PM

## 2018-11-19 NOTE — Care Management Important Message (Signed)
Important Message  Patient Details  Name: Karl Miller MRN: [id number] Date of Birth: [birth year]   Medicare Important Message Given:  Yes    Arnoldo Hildreth 11/19/2018, 4:02 PM

## 2018-11-19 NOTE — Progress Notes (Signed)
Pt discharge education completed. Pt discharge to [facility]; RN attempted to report off to facility x2 but unsuccessful. Discharge packed handed to PTAR. Pt picked up by PTAR to be transported off to disposition. Pt transported off unit via stretcher with belongings to the side and clothing on. Delia Heady RN

## 2018-12-02 ENCOUNTER — Ambulatory Visit (HOSPITAL_COMMUNITY)
Admission: RE | Admit: 2018-12-02 | Discharge: 2018-12-02 | Disposition: A | Discharge status: Home / Self Care | Payer: No Typology Code available for payment source | Source: Ambulatory Visit | Attending: Internal Medicine | Admitting: Internal Medicine

## 2018-12-02 ENCOUNTER — Other Ambulatory Visit: Payer: Self-pay

## 2018-12-02 DIAGNOSIS — D649 Anemia, unspecified: Secondary | ICD-10-CM | POA: Diagnosis not present

## 2018-12-02 DIAGNOSIS — Z1159 Encounter for screening for other viral diseases: Secondary | ICD-10-CM | POA: Diagnosis not present

## 2018-12-02 DIAGNOSIS — Z01812 Encounter for preprocedural laboratory examination: Secondary | ICD-10-CM | POA: Diagnosis not present

## 2018-12-02 LAB — PREPARE RBC (CROSSMATCH)

## 2018-12-02 LAB — SARS CORONAVIRUS 2 BY RT PCR (HOSPITAL ORDER, PERFORMED IN ~~LOC~~ HOSPITAL LAB): SARS Coronavirus 2: NEGATIVE

## 2018-12-03 ENCOUNTER — Other Ambulatory Visit: Payer: Self-pay

## 2018-12-03 ENCOUNTER — Ambulatory Visit (HOSPITAL_COMMUNITY)
Admission: RE | Admit: 2018-12-03 | Discharge: 2018-12-03 | Disposition: A | Discharge status: Home / Self Care | Payer: No Typology Code available for payment source | Source: Ambulatory Visit | Attending: Internal Medicine | Admitting: Internal Medicine

## 2018-12-03 DIAGNOSIS — D649 Anemia, unspecified: Secondary | ICD-10-CM | POA: Diagnosis not present

## 2018-12-03 MED ORDER — SODIUM CHLORIDE 0.9% IV SOLUTION: Freq: Once | INTRAVENOUS | Status: DC

## 2018-12-05 LAB — TYPE AND SCREEN
ABO/RH(D): O NEG
Antibody Screen: NEGATIVE
Unit division: 0

## 2018-12-05 LAB — BPAM RBC
Blood Product Expiration Date: 202005162359
ISSUE DATE / TIME: 202005150820
Unit Type and Rh: 9500

## 2018-12-28 ENCOUNTER — Encounter: Payer: No Typology Code available for payment source | Admitting: *Deleted

## 2018-12-29 ENCOUNTER — Telehealth: Payer: Self-pay

## 2019-01-07 ENCOUNTER — Ambulatory Visit (INDEPENDENT_AMBULATORY_CARE_PROVIDER_SITE_OTHER): Payer: Medicare Other | Admitting: *Deleted

## 2019-01-07 DIAGNOSIS — I63019 Cerebral infarction due to thrombosis of unspecified vertebral artery: Secondary | ICD-10-CM

## 2019-01-09 LAB — CUP PACEART REMOTE DEVICE CHECK
Date Time Interrogation Session: 20200619223910
Implantable Pulse Generator Implant Date: 20190520

## 2019-01-12 NOTE — Progress Notes (Signed)
Carelink Summary Report / Loop Recorder 

## 2019-02-08 ENCOUNTER — Encounter (HOSPITAL_COMMUNITY): Payer: Self-pay

## 2019-02-08 ENCOUNTER — Emergency Department (HOSPITAL_COMMUNITY): Payer: Medicare Other

## 2019-02-08 ENCOUNTER — Other Ambulatory Visit: Payer: Self-pay

## 2019-02-08 ENCOUNTER — Inpatient Hospital Stay (HOSPITAL_COMMUNITY)
Admission: EM | Admit: 2019-02-08 | Discharge: 2019-02-14 | DRG: 378 | Disposition: A | Discharge status: Skilled Nursing Facility | Payer: Medicare Other | Attending: Internal Medicine | Admitting: Internal Medicine

## 2019-02-08 DIAGNOSIS — E785 Hyperlipidemia, unspecified: Secondary | ICD-10-CM | POA: Diagnosis not present

## 2019-02-08 DIAGNOSIS — Z8673 Personal history of transient ischemic attack (TIA), and cerebral infarction without residual deficits: Secondary | ICD-10-CM

## 2019-02-08 DIAGNOSIS — F17210 Nicotine dependence, cigarettes, uncomplicated: Secondary | ICD-10-CM | POA: Diagnosis present

## 2019-02-08 DIAGNOSIS — G309 Alzheimer's disease, unspecified: Secondary | ICD-10-CM | POA: Diagnosis present

## 2019-02-08 DIAGNOSIS — R195 Other fecal abnormalities: Secondary | ICD-10-CM | POA: Diagnosis not present

## 2019-02-08 DIAGNOSIS — K635 Polyp of colon: Secondary | ICD-10-CM | POA: Diagnosis not present

## 2019-02-08 DIAGNOSIS — N183 Chronic kidney disease, stage 3 (moderate): Secondary | ICD-10-CM | POA: Diagnosis present

## 2019-02-08 DIAGNOSIS — K922 Gastrointestinal hemorrhage, unspecified: Secondary | ICD-10-CM | POA: Diagnosis present

## 2019-02-08 DIAGNOSIS — Z20828 Contact with and (suspected) exposure to other viral communicable diseases: Secondary | ICD-10-CM | POA: Diagnosis present

## 2019-02-08 DIAGNOSIS — F0280 Dementia in other diseases classified elsewhere without behavioral disturbance: Secondary | ICD-10-CM | POA: Diagnosis present

## 2019-02-08 DIAGNOSIS — E7800 Pure hypercholesterolemia, unspecified: Secondary | ICD-10-CM | POA: Diagnosis present

## 2019-02-08 DIAGNOSIS — K31811 Angiodysplasia of stomach and duodenum with bleeding: Secondary | ICD-10-CM | POA: Diagnosis not present

## 2019-02-08 DIAGNOSIS — K5730 Diverticulosis of large intestine without perforation or abscess without bleeding: Secondary | ICD-10-CM | POA: Diagnosis present

## 2019-02-08 DIAGNOSIS — F05 Delirium due to known physiological condition: Secondary | ICD-10-CM | POA: Diagnosis not present

## 2019-02-08 DIAGNOSIS — Z515 Encounter for palliative care: Secondary | ICD-10-CM

## 2019-02-08 DIAGNOSIS — D62 Acute posthemorrhagic anemia: Secondary | ICD-10-CM | POA: Diagnosis present

## 2019-02-08 DIAGNOSIS — N400 Enlarged prostate without lower urinary tract symptoms: Secondary | ICD-10-CM | POA: Diagnosis not present

## 2019-02-08 DIAGNOSIS — G9589 Other specified diseases of spinal cord: Secondary | ICD-10-CM | POA: Diagnosis not present

## 2019-02-08 DIAGNOSIS — F0150 Vascular dementia without behavioral disturbance: Secondary | ICD-10-CM | POA: Diagnosis not present

## 2019-02-08 DIAGNOSIS — D649 Anemia, unspecified: Secondary | ICD-10-CM

## 2019-02-08 DIAGNOSIS — I5032 Chronic diastolic (congestive) heart failure: Secondary | ICD-10-CM | POA: Diagnosis present

## 2019-02-08 DIAGNOSIS — I10 Essential (primary) hypertension: Secondary | ICD-10-CM | POA: Diagnosis not present

## 2019-02-08 DIAGNOSIS — Z7189 Other specified counseling: Secondary | ICD-10-CM | POA: Diagnosis not present

## 2019-02-08 DIAGNOSIS — I130 Hypertensive heart and chronic kidney disease with heart failure and stage 1 through stage 4 chronic kidney disease, or unspecified chronic kidney disease: Secondary | ICD-10-CM | POA: Diagnosis present

## 2019-02-08 DIAGNOSIS — R220 Localized swelling, mass and lump, head: Secondary | ICD-10-CM | POA: Diagnosis not present

## 2019-02-08 DIAGNOSIS — K5520 Angiodysplasia of colon without hemorrhage: Secondary | ICD-10-CM | POA: Diagnosis present

## 2019-02-08 DIAGNOSIS — D509 Iron deficiency anemia, unspecified: Secondary | ICD-10-CM | POA: Diagnosis not present

## 2019-02-08 DIAGNOSIS — G959 Disease of spinal cord, unspecified: Secondary | ICD-10-CM | POA: Diagnosis not present

## 2019-02-08 DIAGNOSIS — Z833 Family history of diabetes mellitus: Secondary | ICD-10-CM

## 2019-02-08 DIAGNOSIS — R269 Unspecified abnormalities of gait and mobility: Secondary | ICD-10-CM | POA: Diagnosis not present

## 2019-02-08 DIAGNOSIS — Z7902 Long term (current) use of antithrombotics/antiplatelets: Secondary | ICD-10-CM | POA: Diagnosis not present

## 2019-02-08 DIAGNOSIS — K5521 Angiodysplasia of colon with hemorrhage: Secondary | ICD-10-CM | POA: Diagnosis not present

## 2019-02-08 DIAGNOSIS — D500 Iron deficiency anemia secondary to blood loss (chronic): Secondary | ICD-10-CM | POA: Diagnosis not present

## 2019-02-08 DIAGNOSIS — Z9889 Other specified postprocedural states: Secondary | ICD-10-CM | POA: Diagnosis not present

## 2019-02-08 DIAGNOSIS — Z79899 Other long term (current) drug therapy: Secondary | ICD-10-CM | POA: Diagnosis not present

## 2019-02-08 LAB — URINALYSIS, ROUTINE W REFLEX MICROSCOPIC
Bilirubin Urine: NEGATIVE
Glucose, UA: NEGATIVE mg/dL
Hgb urine dipstick: NEGATIVE
Ketones, ur: NEGATIVE mg/dL
Leukocytes,Ua: NEGATIVE
Nitrite: NEGATIVE
Protein, ur: NEGATIVE mg/dL
Specific Gravity, Urine: 1.009 (ref 1.005–1.030)
pH: 7 (ref 5.0–8.0)

## 2019-02-08 LAB — CBC
HCT: 24.3 % — ABNORMAL LOW (ref 39.0–52.0)
Hemoglobin: 6.9 g/dL — CL (ref 13.0–17.0)
MCH: 25.7 pg — ABNORMAL LOW (ref 26.0–34.0)
MCHC: 28.4 g/dL — ABNORMAL LOW (ref 30.0–36.0)
MCV: 90.3 fL (ref 80.0–100.0)
Platelets: 309 10*3/uL (ref 150–400)
RBC: 2.69 MIL/uL — ABNORMAL LOW (ref 4.22–5.81)
RDW: 13.9 % (ref 11.5–15.5)
WBC: 3.5 10*3/uL — ABNORMAL LOW (ref 4.0–10.5)
nRBC: 0 % (ref 0.0–0.2)

## 2019-02-08 LAB — COMPREHENSIVE METABOLIC PANEL
ALT: 14 U/L (ref 0–44)
AST: 18 U/L (ref 15–41)
Albumin: 3.3 g/dL — ABNORMAL LOW (ref 3.5–5.0)
Alkaline Phosphatase: 39 U/L (ref 38–126)
Anion gap: 6 (ref 5–15)
BUN: 20 mg/dL (ref 8–23)
CO2: 26 mmol/L (ref 22–32)
Calcium: 9.3 mg/dL (ref 8.9–10.3)
Chloride: 109 mmol/L (ref 98–111)
Creatinine, Ser: 1.52 mg/dL — ABNORMAL HIGH (ref 0.61–1.24)
GFR calc Af Amer: 55 mL/min — ABNORMAL LOW (ref >60)
GFR calc non Af Amer: 47 mL/min — ABNORMAL LOW (ref >60)
Glucose, Bld: 82 mg/dL (ref 70–99)
Potassium: 4 mmol/L (ref 3.5–5.1)
Sodium: 141 mmol/L (ref 135–145)
Total Bilirubin: 0.6 mg/dL (ref 0.3–1.2)
Total Protein: 6.3 g/dL — ABNORMAL LOW (ref 6.5–8.1)

## 2019-02-08 LAB — RAPID URINE DRUG SCREEN, HOSP PERFORMED
Amphetamines: NOT DETECTED
Barbiturates: NOT DETECTED
Benzodiazepines: NOT DETECTED
Cocaine: NOT DETECTED
Opiates: NOT DETECTED
Tetrahydrocannabinol: NOT DETECTED

## 2019-02-08 LAB — DIFFERENTIAL
Abs Immature Granulocytes: 0.02 10*3/uL (ref 0.00–0.07)
Basophils Absolute: 0 10*3/uL (ref 0.0–0.1)
Basophils Relative: 1 %
Eosinophils Absolute: 0.2 10*3/uL (ref 0.0–0.5)
Eosinophils Relative: 4 %
Immature Granulocytes: 1 %
Lymphocytes Relative: 19 %
Lymphs Abs: 0.7 10*3/uL (ref 0.7–4.0)
Monocytes Absolute: 0.4 10*3/uL (ref 0.1–1.0)
Monocytes Relative: 13 %
Neutro Abs: 2.2 10*3/uL (ref 1.7–7.7)
Neutrophils Relative %: 62 %

## 2019-02-08 LAB — SARS CORONAVIRUS 2 BY RT PCR (HOSPITAL ORDER, PERFORMED IN ~~LOC~~ HOSPITAL LAB): SARS Coronavirus 2: NEGATIVE

## 2019-02-08 LAB — PROTIME-INR
INR: 1.1 (ref 0.8–1.2)
Prothrombin Time: 14.2 seconds (ref 11.4–15.2)

## 2019-02-08 LAB — MRSA PCR SCREENING: MRSA by PCR: NEGATIVE

## 2019-02-08 LAB — POC OCCULT BLOOD, ED: Fecal Occult Bld: POSITIVE — AB

## 2019-02-08 LAB — APTT: aPTT: 28 seconds (ref 24–36)

## 2019-02-08 LAB — PREPARE RBC (CROSSMATCH)

## 2019-02-08 MED ORDER — ACETAMINOPHEN 325 MG PO TABS: 650.0000 mg | ORAL_TABLET | Freq: Four times a day (QID) | ORAL | Status: DC | PRN

## 2019-02-08 MED ORDER — FINASTERIDE 5 MG PO TABS
5.0000 mg | ORAL_TABLET | Freq: Every day | ORAL | Status: DC
  Administered 2019-02-08 – 2019-02-14 (×7): 5 mg via ORAL
  Filled 2019-02-08 (×8): qty 1

## 2019-02-08 MED ORDER — SODIUM CHLORIDE 0.9% FLUSH
3.0000 mL | Freq: Two times a day (BID) | INTRAVENOUS | Status: DC
  Administered 2019-02-08 – 2019-02-14 (×11): 3 mL via INTRAVENOUS

## 2019-02-08 MED ORDER — SODIUM CHLORIDE 0.9 % IV SOLN
10.0000 mL/h | Freq: Once | INTRAVENOUS | Status: AC
  Administered 2019-02-08: 14:00:00 10 mL/h via INTRAVENOUS

## 2019-02-08 MED ORDER — PANTOPRAZOLE SODIUM 40 MG IV SOLR
40.0000 mg | Freq: Two times a day (BID) | INTRAVENOUS | Status: DC
  Administered 2019-02-08 – 2019-02-10 (×4): 40 mg via INTRAVENOUS
  Filled 2019-02-08 (×4): qty 40

## 2019-02-08 MED ORDER — ACETAMINOPHEN 650 MG RE SUPP: 650.0000 mg | Freq: Four times a day (QID) | RECTAL | Status: DC | PRN

## 2019-02-08 MED ORDER — ATORVASTATIN CALCIUM 10 MG PO TABS
10.0000 mg | ORAL_TABLET | Freq: Every day | ORAL | Status: DC
  Administered 2019-02-08 – 2019-02-14 (×7): 10 mg via ORAL
  Filled 2019-02-08 (×7): qty 1

## 2019-02-08 MED ORDER — CLOPIDOGREL BISULFATE 75 MG PO TABS
75.0000 mg | ORAL_TABLET | Freq: Every day | ORAL | Status: DC
  Administered 2019-02-08 – 2019-02-14 (×7): 75 mg via ORAL
  Filled 2019-02-08 (×7): qty 1

## 2019-02-08 MED ORDER — DONEPEZIL HCL 10 MG PO TABS
10.0000 mg | ORAL_TABLET | Freq: Every day | ORAL | Status: DC
  Administered 2019-02-08 – 2019-02-14 (×7): 10 mg via ORAL
  Filled 2019-02-08 (×8): qty 1

## 2019-02-08 NOTE — H&P (Addendum)
Date: 02/08/2019               Patient Name:  Karl Miller MRN: [id number]  DOB: Jun 21, 1956 Age / Sex: 63 y.o., male   [facility]              Medical Service: Internal Medicine Teaching Service              Attending Physician: Dr. Melina Copa, Rebeca Alert, MD    First Contact: Maudry Diego, MS3 Pager: [phone]  Second Contact: Dr. Tamsen Snider Pager: [phone]  Third Contact Dr. Pearson Grippe Pager: [phone]       After Hours (After 5p/  First Contact Pager: [phone]  weekends / holidays): Second Contact Pager: [phone]   Chief Complaint: gait abnormality   History of Present Illness:   Karl Miller is a 63 y.o male with a PMHx significant for HTN, HLD, CKD3, vascular dementia, possible alzheimer's disease and chronic anemia who presented to [facility] due to a gait abnormality.   Patient is a limited historian. He responded to most questions with yes or no answers. He was able to elaborate while answering some questions. HPI is obtained from patient, EMS run sheet, nursing home & patient's brother.   Per [place] home staff, Karl Miller was having some new difficulty while walking to breakfast this morning. The nurse described his gait as unsteady. At that time they performed a stroke assessment and noted some new left sided weakness in his grip strength that differed from his baseline. According to [facility] staff night team, patient had no deficits the previous night. In the emergency department, Karl Miller denied difficulty while walking this morning or throughout the day. He did mention having some new left sided weakness; however, he was not able to pinpoint where he felt weak or describe why he felt weak. EMS noted patient was able to transfer himself to ED bed without difficulty. Patient denied dysphagia or loss of feeling.  While in the emergency department, a CBC was drawn and revealed a hemoglobin of 6.9. In addition, a fecal occult blood test was  positive. Patient denied changes to his normal bowel movements including change of color and/or frequency. He also denies nausea, vomiting, or indigestion. He also denies recent falls. Per chart review, patient has had recent hospital course addressing anemia in April of 2020. Patient's brother Karl Miller) recalled Karl Miller previous hospital admission for anemia in April of 2019, but did not have any additional information to add regarding patient's current anemia. Nursing staff at [facility] did not have any information to add either.   Meds:  Current Meds  Medication Sig   acetaminophen (TYLENOL) 500 MG tablet Take 500-1,000 mg by mouth every 6 (six) hours as needed for mild pain.   atorvastatin (LIPITOR) 10 MG tablet Take 1 tablet (10 mg total) by mouth daily at 6 PM.   clopidogrel (PLAVIX) 75 MG tablet Take 75 mg by mouth daily.   donepezil (ARICEPT) 10 MG tablet Take 10 mg by mouth every morning.   ferrous sulfate 325 (65 FE) MG tablet Take 325 mg by mouth daily.    finasteride (PROSCAR) 5 MG tablet Take 5 mg by mouth daily.   guaiFENesin (ROBITUSSIN) 100 MG/5ML SOLN Take 10 mLs by mouth every 4 (four) hours as needed for cough or to loosen phlegm.   loperamide (IMODIUM) 2 MG capsule Take 2 mg by mouth as needed for diarrhea or loose stools (not to exceed 8 doses in  24hrs).   magnesium hydroxide (MILK OF MAGNESIA) 400 MG/5ML suspension Take 30 mLs by mouth at bedtime as needed for mild constipation.   neomycin-bacitracin-polymyxin (NEOSPORIN) OINT Apply 1 application topically as needed for wound care.   pantoprazole (PROTONIX) 40 MG tablet Take 40 mg by mouth daily.    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 Per chart review, he is a current 1 pack a day smoker but denied recent smoking while in the ED. He does not use alcohol or any other illicit drugs.   Review of Systems: A complete ROS was negative except as per HPI.   Physical Exam: Blood pressure (!) 169/92, pulse 83, temperature 98.5 F (36.9 C), temperature source Oral, resp. rate 13, height 5\' 9"  (1.753 m), weight 77.1 kg, SpO2 97 %.  General: He is oriented to person and place, well-nourished and in no distress HEENT: Normocephalic w/two existing bumps on forehead. MMM. PERRL. Normal EOM. Pale conjunctivae CV: Normal rate, regular rhythm. Normal S1, S2. No M/R/G. No  Pulmonary: CTAB. Normal respiratory expansion. No wheezes or rales. Abdominal: Soft. NABS. No abdominal tenderness or distension. No rebound or guarding  Neurological: Patient is alert. He has normal sensation in all extremities. 5/5 motor strength in all extremities. Cranial nerve II - XII intact.  Extremities: No peripheral edema noted. Cold fingers and toes. No lesions noted on extremities.   EKG: personally reviewed my interpretation is normal sinus rhythm with no new changes compared to previous EKG 11/22/2018.  CXR: Patient did not receive chest x-ray upon admission.   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 A source to explain patient's anemia was not identified during the hospitalization and he was supposed to receive a GI bleed work up in the outpatient setting. Today, patient arrived to the ED  with a hemoglobin of 6.9 and an MCV of 90.3. Fecal occult blood testing was positive. Patients BMP revealed [id number] ratio of 20 which is less suggestive of an upper GI bleed. Although patient denied GI disturbances on review of systems, given his age, lack of abdominal pain, insidious history of anemia, and positive fecal occult blood test, a lower GI bleed is likely. Patient may need endoscopy but must remain on Plavix due to extensive CVA history  - transfuse with 2 units of packed RBC with a goal hemoglobin of 8 given patients PMHx of HFrEF - start 40 mg IV pantoprazole Q12h  -order iron studies to better characterize patient's anemia - consult GI for possible EGD, colonoscopy - per, GI recommendations identify goals of care with patients family to determine if full GI work up necessary, consult palliative medicine    CVA: Patient has a lengthy past medical history of strokes beginning in 2014. MRI/CT 07/21 demonstrated no new  or acute intracranial abnormalities, noted existing age advanced atrophy and white matter disease, existing remote infarcts involving posterior right occipital lobe and inferior left cerebellum, and evolving late subacute/early chronic right corona radiata lacunar infarct w/o significant change in location from prior study. In the emergency department, patient had a BP of 169/92 suggesting normal cerebral perfusion. Given lack of new MRI, CT findings and patient's limitations as historian regarding reported weakness and gait instability, we are not able to localize a source for the reported admitting symptoms and will continue to monitor for these symptoms during [facility] hospital course. - monitor with daily, thourough neurological examination - continue home 75 mg oral plavix QD - continue home 10 mg oral atorvastatin QD  Vascular Dementia/Alzheimer's Disease: Prior documentation from neurologist suggests a component of vascular dementia due to recurrent strokes as  well as possible Alzheimer's. His brother reports that his cognition has worsened following each stroke. - continue at home 10 mg oral  donepezil QD - order delirium precautions   Hypertension - restart patients 20 mg oral lisinopril QD  Hyperlipidemia - continue home med of 10 mg atorvastatin QD @ 6pm  BPH: Patient's brother mentioned an upcoming urology appointment to address prostate cancer vs. BPH due to high PSA levels. Patient currently prescribed finasteride - continue at home 5 mg finasteride tablet QD    Dispo: Admit patient to floor with expected stay of 2 nights.   Signed: Juanna Cao, Medical Student 02/08/2019, 3:41 PM  Pager: [phone]  Attestation for Student Documentation:  I personally was present and performed or re-performed the history, physical exam and medical decision-making activities of this service and have verified that the service and findings are accurately documented in the students note.  Madalyn Rob, MD 02/08/2019, 8:31 PM

## 2019-02-08 NOTE — ED Notes (Signed)
Attempted to call report

## 2019-02-08 NOTE — ED Notes (Signed)
Pt returns from MRI and placed back on monitor.

## 2019-02-08 NOTE — ED Provider Notes (Signed)
Signout by Dr. Lita Mains.  63 year old male with anemia here with generalized weakness and found to have a low hemoglobin.  Guaiac positive. Physical Exam  BP (!) 169/92   Pulse 83   Temp 98.5 F (36.9 C) (Oral)   Resp 13   Ht 5\' 9"  (1.753 m)   Wt 77.1 kg   SpO2 97%   BMI 25.10 kg/m   Physical Exam  ED Course/Procedures     Procedures  MDM  Patient is admitted to internal medicine team who asked for GI consult to be placed.  I discussed with GI nurse practitioner Chester Holstein from [facility] who will consult on the patient.        Hayden Rasmussen, MD 02/09/19 1538

## 2019-02-08 NOTE — ED Notes (Signed)
ED TO INPATIENT HANDOFF REPORT  ED Nurse Name and Phone #: [phone]  S Name/Age/Gender Karl Miller 63 y.o. male Room/Bed: 035C/035C  Code Status   Code Status: Full Code  Home/SNF/Other Nursing Home Patient oriented to: self Is this baseline? Yes   Triage Complete: Triage complete  Chief Complaint lt sided weakness  Triage Note Arrives EMS from [facility] for c/o gait abnormality noted on walking with walker to breakfast this am. EMS states very minimal grip strength right greater than left. No arm drift noted by EMS. Hx of dementia and pt at baseline per NH staff. Hx of previous stroke with no known deficits.   Allergies No Known Allergies  Level of Care/Admitting Diagnosis ED Disposition    ED Disposition Condition Comment   Admit  Hospital Area: [place] [[id number]]  Level of Care: Telemetry Medical [104]  Covid Evaluation: Confirmed COVID Negative  Diagnosis: GI bleed [032122]  Admitting Physician: Cresenciano Lick  Attending Physician: Sid Falcon [phone]  Estimated length of stay: past midnight tomorrow  Certification:: I certify this patient will need inpatient services for at least 2 midnights  PT Class (Do Not Modify): Inpatient [101]  PT Acc Code (Do Not Modify): Private [1]       B Medical/Surgery History Past Medical History:  Diagnosis Date  . Acute encephalopathy 12/03/2017   [name] 12/04/2017  . High cholesterol   . Hypertension   . Stroke ([place])   . TIA (transient ischemic attack) 12/03/2017   [name] 12/04/2017   Past Surgical History:  Procedure Laterality Date  . LOOP RECORDER INSERTION N/A 12/07/2017   Procedure: LOOP RECORDER INSERTION;  Surgeon: Constance Haw, MD;  Location: [place] [facility];  Service: Cardiovascular;  Laterality: N/A;  . NO PAST SURGERIES    . TEE WITHOUT CARDIOVERSION N/A 12/07/2017   Procedure: TRANSESOPHAGEAL ECHOCARDIOGRAM (TEE);  Surgeon: Dorothy Spark, MD;   Location: [facility] ENDOSCOPY;  Service: Cardiovascular;  Laterality: N/A;     A IV Location/Drains/Wounds Patient Lines/Drains/Airways Status   Active Line/Drains/Airways    Name:   Placement date:   Placement time:   Site:   Days:   Peripheral IV 02/08/19 Left Antecubital   02/08/19    0813    Antecubital   less than 1   Peripheral IV 02/08/19 Right Antecubital   02/08/19    1100    Antecubital   less than 1          Intake/Output Last 24 hours No intake or output data in the 24 hours ending 02/08/19 1630  Labs/Imaging Results for orders placed or performed during the hospital encounter of 02/08/19 (from the past 48 hour(s))  Protime-INR     Status: None   Collection Time: 02/08/19  8:32 AM  Result Value Ref Range   Prothrombin Time 14.2 11.4 - 15.2 seconds   INR 1.1 0.8 - 1.2    Comment: (NOTE) INR goal varies based on device and disease states. Performed at [facility], [place] [address], [place], [place] [address]   APTT     Status: None   Collection Time: 02/08/19  8:32 AM  Result Value Ref Range   aPTT 28 24 - 36 seconds    Comment: Performed at [facility] [address], [place],  [address]  CBC     Status: Abnormal   Collection Time: 02/08/19  8:32 AM  Result Value Ref Range   WBC 3.5 (L) 4.0 -  10.5 K/uL   RBC 2.69 (L) 4.22 - 5.81 MIL/uL   Hemoglobin 6.9 (LL) 13.0 - 17.0 g/dL    Comment: REPEATED TO VERIFY THIS CRITICAL RESULT HAS VERIFIED AND BEEN CALLED TO [name],M RN BY AMANDA LEONARD ON 07 21 2020 AT 0908, AND HAS BEEN READ BACK.     HCT 24.3 (L) 39.0 - 52.0 %   MCV 90.3 80.0 - 100.0 fL   MCH 25.7 (L) 26.0 - 34.0 pg   MCHC 28.4 (L) 30.0 - 36.0 g/dL   RDW 13.9 11.5 - 15.5 %   Platelets 309 150 - 400 K/uL   nRBC 0.0 0.0 - 0.2 %    Comment: Performed at [facility] [address], [place], [place] [address]  Differential     Status: None   Collection Time: 02/08/19  8:32 AM  Result Value Ref Range   Neutrophils Relative % 62 %    Neutro Abs 2.2 1.7 - 7.7 K/uL   Lymphocytes Relative 19 %   Lymphs Abs 0.7 0.7 - 4.0 K/uL   Monocytes Relative 13 %   Monocytes Absolute 0.4 0.1 - 1.0 K/uL   Eosinophils Relative 4 %   Eosinophils Absolute 0.2 0.0 - 0.5 K/uL   Basophils Relative 1 %   Basophils Absolute 0.0 0.0 - 0.1 K/uL   Immature Granulocytes 1 %   Abs Immature Granulocytes 0.02 0.00 - 0.07 K/uL    Comment: Performed at [facility] [address] [place], [place] [address]  Comprehensive metabolic panel     Status: Abnormal   Collection Time: 02/08/19  8:32 AM  Result Value Ref Range   Sodium 141 135 - 145 mmol/L   Potassium 4.0 3.5 - 5.1 mmol/L   Chloride 109 98 - 111 mmol/L   CO2 26 22 - 32 mmol/L   Glucose, Bld 82 70 - 99 mg/dL   BUN 20 8 - 23 mg/dL   Creatinine, Ser 1.52 (H) 0.61 - 1.24 mg/dL   Calcium 9.3 8.9 - 10.3 mg/dL   Total Protein 6.3 (L) 6.5 - 8.1 g/dL   Albumin 3.3 (L) 3.5 - 5.0 g/dL   AST 18 15 - 41 U/L   ALT 14 0 - 44 U/L   Alkaline Phosphatase 39 38 - 126 U/L   Total Bilirubin 0.6 0.3 - 1.2 mg/dL   GFR calc non Af Amer 47 (L) >60 mL/min   GFR calc Af Amer 55 (L) >60 mL/min   Anion gap 6 5 - 15    Comment: Performed at [facility] [address] [place], [place] [address]  SARS Coronavirus 2 (CEPHEID - Performed in [facility] [place] hospital lab), Hosp Order     Status: None   Collection Time: 02/08/19  8:32 AM   Specimen: Nasopharyngeal Swab  Result Value Ref Range   SARS Coronavirus 2 NEGATIVE NEGATIVE    Comment: (NOTE) If result is NEGATIVE SARS-CoV-2 target nucleic acids are NOT DETECTED. The SARS-CoV-2 RNA is generally detectable in upper and lower  respiratory specimens during the acute phase of infection. The lowest  concentration of SARS-CoV-2 viral copies this assay can detect is 250  copies / mL. A negative result does not preclude SARS-CoV-2 infection  and should not be used as the sole basis for treatment or other  patient management decisions.  A negative  result may occur with  improper specimen collection / handling, submission of specimen other  than nasopharyngeal swab, presence of viral mutation(s)  within the  areas targeted by this assay, and inadequate number of viral copies  (<250 copies / mL). A negative result must be combined with clinical  observations, patient history, and epidemiological information. If result is POSITIVE SARS-CoV-2 target nucleic acids are DETECTED. The SARS-CoV-2 RNA is generally detectable in upper and lower  respiratory specimens dur ing the acute phase of infection.  Positive  results are indicative of active infection with SARS-CoV-2.  Clinical  correlation with patient history and other diagnostic information is  necessary to determine patient infection status.  Positive results do  not rule out bacterial infection or co-infection with other viruses. If result is PRESUMPTIVE POSTIVE SARS-CoV-2 nucleic acids MAY BE PRESENT.   A presumptive positive result was obtained on the submitted specimen  and confirmed on repeat testing.  While 2019 novel coronavirus  (SARS-CoV-2) nucleic acids may be present in the submitted sample  additional confirmatory testing may be necessary for epidemiological  and / or clinical management purposes  to differentiate between  SARS-CoV-2 and other Sarbecovirus currently known to infect humans.  If clinically indicated additional testing with an alternate test  methodology [phone]) is advised. The SARS-CoV-2 RNA is generally  detectable in upper and lower respiratory sp ecimens during the acute  phase of infection. The expected result is Negative. Fact Sheet for Patients:  StrictlyIdeas.no Fact Sheet for Healthcare Providers: BankingDealers.co.za This test is not yet approved or cleared by the [place] FDA and has been authorized for detection and/or diagnosis of SARS-CoV-2 by FDA under an Emergency Use Authorization  (EUA).  This EUA will remain in effect (meaning this test can be used) for the duration of the COVID-19 declaration under Section 564(b)(1) of the Act, 21 U.S.C. section 360bbb-3(b)(1), unless the authorization is terminated or revoked sooner. Performed at [facility], [place] [address], [place], [place] [address]   Type and screen [place]     Status: None (Preliminary result)   Collection Time: 02/08/19 10:50 AM  Result Value Ref Range   ABO/RH(D) O NEG    Antibody Screen NEG    Sample Expiration 02/11/2019,2359    Unit Number [id number]    Blood Component Type RED CELLS,LR    Unit division 00    Status of Unit ISSUED    Transfusion Status OK TO TRANSFUSE    Crossmatch Result COMPATIBLE    Unit Number [id number]    Blood Component Type RED CELLS,LR    Unit division 00    Status of Unit ALLOCATED    Transfusion Status OK TO TRANSFUSE    Crossmatch Result COMPATIBLE   Prepare RBC     Status: None   Collection Time: 02/08/19 11:03 AM  Result Value Ref Range   Order Confirmation      ORDER PROCESSED BY BLOOD BANK Performed at [facility], [place] [address], [place], [place] [address]   POC occult blood, ED     Status: Abnormal   Collection Time: 02/08/19 11:37 AM  Result Value Ref Range   Fecal Occult Bld POSITIVE (A) NEGATIVE  Urine rapid drug screen (hosp performed)     Status: None   Collection Time: 02/08/19  1:09 PM  Result Value Ref Range   Opiates NONE DETECTED NONE DETECTED   Cocaine NONE DETECTED NONE DETECTED   Benzodiazepines NONE DETECTED NONE DETECTED   Amphetamines NONE DETECTED NONE DETECTED   Tetrahydrocannabinol NONE DETECTED NONE DETECTED   Barbiturates NONE DETECTED NONE DETECTED  Comment: (NOTE) DRUG SCREEN FOR MEDICAL PURPOSES ONLY.  IF CONFIRMATION IS NEEDED FOR ANY PURPOSE, NOTIFY LAB WITHIN 5 DAYS. LOWEST DETECTABLE LIMITS FOR URINE DRUG SCREEN Drug Class                     Cutoff (ng/mL) Amphetamine and  metabolites    1000 Barbiturate and metabolites    200 Benzodiazepine                 614 Tricyclics and metabolites     300 Opiates and metabolites        300 Cocaine and metabolites        300 THC                            50 Performed at [facility], [place] [address] [place], [place] [address]   Urinalysis, Routine w reflex microscopic     Status: Abnormal   Collection Time: 02/08/19  1:09 PM  Result Value Ref Range   Color, Urine STRAW (A) YELLOW   APPearance CLEAR CLEAR   Specific Gravity, Urine 1.009 1.005 - 1.030   pH 7.0 5.0 - 8.0   Glucose, UA NEGATIVE NEGATIVE mg/dL   Hgb urine dipstick NEGATIVE NEGATIVE   Bilirubin Urine NEGATIVE NEGATIVE   Ketones, ur NEGATIVE NEGATIVE mg/dL   Protein, ur NEGATIVE NEGATIVE mg/dL   Nitrite NEGATIVE NEGATIVE   Leukocytes,Ua NEGATIVE NEGATIVE    Comment: Performed at [facility] [address], [place], [place] [address]   Ct Head Wo Contrast  Result Date: 02/08/2019 CLINICAL DATA:  Focal neuro deficits, stroke suspected EXAM: CT HEAD WITHOUT CONTRAST TECHNIQUE: Contiguous axial images were obtained from the base of the skull through the vertex without intravenous contrast. COMPARISON:  11/15/2018 FINDINGS: Brain: Multiple old bilateral basal ganglia and periventricular white matter lacunar infarcts. There is atrophy and chronic small vessel disease changes. No acute intracranial abnormality. Specifically, no hemorrhage, hydrocephalus, mass lesion, acute infarction, or significant intracranial injury. Old right occipital infarct, stable. Vascular: No hyperdense vessel or unexpected calcification. Skull: No acute calvarial abnormality. Sinuses/Orbits: Visualized paranasal sinuses and mastoids clear. Orbital soft tissues unremarkable. Other: None IMPRESSION: Extensive chronic small vessel disease and numerous old lacunar infarcts. Old right occipital infarct. No acute intracranial abnormality. Electronically Signed   By: Rolm Baptise  M.D.   On: 02/08/2019 10:15   Mr Brain Wo Contrast  Result Date: 02/08/2019 CLINICAL DATA:  Focal neuro deficit greater than 6 hours, stroke suspected. Left lower extremity weakness. Decreased ability to ambulate. EXAM: MRI HEAD WITHOUT CONTRAST TECHNIQUE: Multiplanar, multiecho pulse sequences of the brain and surrounding structures were obtained without intravenous contrast. COMPARISON:  MRI of the head without contrast 11/15/2018. CT head without contrast 02/08/2019 FINDINGS: Brain: Diffusion-weighted images demonstrate the late subacute/early chronic infarct of the right corona radiata. More remote lacunar infarct of the left corona radiata is again noted. There are multiple remote lacunar infarcts involving the basal ganglia and corona radiata bilaterally. Remote right occipital lobe infarct is present. Remote left PICA territory infarcts are present. No acute intracranial abnormality is present. There is no acute hemorrhage or mass lesion. Confluent periventricular and diffuse subcortical white matter changes are again noted. The ventricles are proportionate to the degree of atrophy. No significant extraaxial fluid collection is present. Vascular: Flow is present in the major intracranial arteries. Skull and upper cervical spine: The craniocervical junction is normal. Upper cervical  spine is within normal limits. Marrow signal is unremarkable. Sinuses/Orbits: The paranasal sinuses and mastoid air cells are clear. The globes and orbits are within normal limits. IMPRESSION: 1. Evolving late subacute/early chronic right corona radiata lacunar infarct without significant change in location from the previous study. 2. No new or acute intracranial abnormalities. 3. Multiple other remote lacunar infarcts of the basal ganglia and corona radiata bilaterally are stable. 4. Age advanced atrophy and white matter disease likely reflects the sequela of chronic microvascular ischemia. 5. Remote infarcts involving the  posterior right occipital lobe and the inferior left cerebellum are stable. Electronically Signed   By: San Morelle M.D.   On: 02/08/2019 13:59    Pending Labs Unresulted Labs (From admission, onward)    Start     Ordered   02/09/19 0500  CBC  Tomorrow morning,   R     02/08/19 1609   02/09/19 4742  Basic metabolic panel  Tomorrow morning,   R     02/08/19 1609   02/08/19 1610  Reticulocytes  Add-on,   AD     02/08/19 1609   02/08/19 1610  Iron and TIBC  Add-on,   AD     02/08/19 1609   02/08/19 1610  Ferritin  Add-on,   AD     02/08/19 1609   02/08/19 1607  HIV antibody (Routine Testing)  Once,   STAT     02/08/19 1609   02/08/19 1135  Occult blood card to lab, stool RN will collect  Once,   STAT    Question:  Specimen to be collected by:  Answer:  RN will collect   02/08/19 1134          Vitals/Pain Today's Vitals   02/08/19 1530 02/08/19 1545 02/08/19 1600 02/08/19 1615  BP: (!) 156/87 (!) 165/100 (!) 177/97 (!) 185/103  Pulse: (!) 48 (!) 52 (!) 53 (!) 58  Resp: 13 16 15  (!) 24  Temp:      TempSrc:      SpO2: 100% 100% 100% 100%  Weight:      Height:      PainSc:        Isolation Precautions No active isolations  Medications Medications  atorvastatin (LIPITOR) tablet 10 mg (has no administration in time range)  donepezil (ARICEPT) tablet 10 mg (has no administration in time range)  pantoprazole (PROTONIX) injection 40 mg (has no administration in time range)  finasteride (PROSCAR) tablet 5 mg (has no administration in time range)  clopidogrel (PLAVIX) tablet 75 mg (has no administration in time range)  sodium chloride flush (NS) 0.9 % injection 3 mL (has no administration in time range)  acetaminophen (TYLENOL) tablet 650 mg (has no administration in time range)    Or  acetaminophen (TYLENOL) suppository 650 mg (has no administration in time range)  0.9 %  sodium chloride infusion (10 mL/hr Intravenous New Bag/Given 02/08/19 1331)    Mobility walks  with device High fall risk   Focused Assessments Neuro Assessment Handoff:  Swallow screen pass? Yes  Cardiac Rhythm: Normal sinus rhythm NIH Stroke Scale ( + Modified Stroke Scale Criteria)  Interval: Initial Level of Consciousness (1a.)   : Alert, keenly responsive LOC Questions (1b. )   +: Answers neither question correctly LOC Commands (1c. )   + : Performs both tasks correctly Best Gaze (2. )  +: Normal Visual (3. )  +: No visual loss Facial Palsy (4. )    : Minor paralysis(old per  chart) Motor Arm, Left (5a. )   +: No drift Motor Arm, Right (5b. )   +: No drift Motor Leg, Left (6a. )   +: No drift Motor Leg, Right (6b. )   +: No drift Limb Ataxia (7. ): Absent Sensory (8. )   +: Normal, no sensory loss Best Language (9. )   +: No aphasia Dysarthria (10. ): Normal Extinction/Inattention (11.)   +: No Abnormality Modified SS Total  +: 2 Complete NIHSS TOTAL: 3     Neuro Assessment: Exceptions to WDL Neuro Checks:   Initial (02/08/19 1975)  Last Documented NIHSS Modified Score: 2 (02/08/19 1628) Has TPA been given? No If patient is a Neuro Trauma and patient is going to OR before floor call report to [facility] nurse: [phone] or [phone]     R Recommendations: See Admitting Provider Note  Report given to:   Additional Notes: receiving bloosd.

## 2019-02-08 NOTE — ED Provider Notes (Signed)
[facility]ERGENC[facility] DEPARTMENT Provider Note   CSN: [id number] Arrival date & time:       History   Chief Complaint Chief Complaint  Patient presents with  . Weakness    HPI Karl Miller is a 63 y.o. male.     HPI Patient presents with gait abnormality by EMS.  Coming from nursing home.  Per EMS patient was noted to be leaning to the left while ambulating by nursing home staff.  Unknown last seen normal.  Patient has a history of dementia.  Was admitted earlier this year for multiple infarcts.  Thinks his left arm and leg may feel weaker than his right.  Denies sensory or visual changes.  Denies headache, chest pain or shortness of breath. Past Medical History:  Diagnosis Date  . Acute encephalopathy 12/03/2017   [name] 12/04/2017  . High cholesterol   . Hypertension   . Stroke ([place])   . TIA (transient ischemic attack) 12/03/2017   [name] 12/04/2017    Patient Active Problem List   Diagnosis Date Noted  . CVA (cerebral vascular accident) ([place]) 11/15/2018  . Benign essential HTN   . Tobacco abuse   . TIA (transient ischemic attack)   . Diastolic dysfunction   . Prediabetes   . Acute blood loss anemia   . Stage 3 chronic kidney disease ([place])   . Hyperlipidemia   . Syphilis   . Cerebral embolism with cerebral infarction 12/05/2017  . Smoker   . Acute encephalopathy 12/04/2017  . HTN (hypertension) 12/04/2017  . AKI (acute kidney injury) ([place]) 12/04/2017  . Elevated PSA 12/04/2017  . Rhabdomyolysis 12/04/2017    Past Surgical History:  Procedure Laterality Date  . LOOP RECORDER INSERTION N/A 12/07/2017   Procedure: LOOP RECORDER INSERTION;  Surgeon: Constance Haw, MD;  Location: [facility];  Service: Cardiovascular;  Laterality: N/A;  . NO PAST SURGERIES    . TEE WITHOUT CARDIOVERSION N/A 12/07/2017   Procedure: TRANSESOPHAGEAL ECHOCARDIOGRAM (TEE);  Surgeon: Dorothy Spark, MD;  Location: [facility] ENDOSCOPY;  Service:  Cardiovascular;  Laterality: N/A;        Home Medications    Prior to Admission medications   Medication Sig Start Date End Date Taking? Authorizing Provider  acetaminophen (TYLENOL) 500 MG tablet Take 500-1,000 mg by mouth every 6 (six) hours as needed for mild pain.   Yes [provider]  atorvastatin (LIPITOR) 10 MG tablet Take 1 tablet (10 mg total) by mouth daily at 6 PM. 11/19/18  Yes Meccariello, Bernita Raisin, DO  clopidogrel (PLAVIX) 75 MG tablet Take 75 mg by mouth daily.   Yes [provider]  donepezil (ARICEPT) 10 MG tablet Take 10 mg by mouth every morning.   Yes [provider]  ferrous sulfate 325 (65 FE) MG tablet Take 325 mg by mouth daily.    Yes [provider]  finasteride (PROSCAR) 5 MG tablet Take 5 mg by mouth daily.   Yes [provider]  guaiFENesin (ROBITUSSIN) 100 MG/5ML SOLN Take 10 mLs by mouth every 4 (four) hours as needed for cough or to loosen phlegm.   Yes [provider]  loperamide (IMODIUM) 2 MG capsule Take 2 mg by mouth as needed for diarrhea or loose stools (not to exceed 8 doses in 24hrs).   Yes [provider]  magnesium hydroxide (MILK OF MAGNESIA) 400 MG/5ML suspension Take 30 mLs by mouth at bedtime as needed for mild constipation.   Yes [provider]  neomycin-bacitracin-polymyxin (NEOSPORIN) OINT Apply 1 application topically as needed for wound care.   Yes [provider]  pantoprazole (PROTONIX) 40 MG tablet Take 40 mg by mouth daily.   Yes [provider]    Family History No family history on file.  Social History Social History   Tobacco Use  . Smoking status: Current Every Day Smoker    Packs/day: 0.50    Years: 24.00    Pack years: 12.00    Types: Cigarettes  . Smokeless tobacco: Never Used  Substance Use Topics  . Alcohol use: No    Frequency: Never  . Drug use: No     Allergies   Patient has no known allergies.   Review of Systems  Review of Systems  Unable to perform ROS: Dementia  Constitutional: Negative for fever.  Eyes: Negative for visual disturbance.  Respiratory: Negative for shortness of breath.   Cardiovascular: Negative for chest pain.  Gastrointestinal: Negative for abdominal pain and vomiting.  Musculoskeletal: Positive for gait problem.  Skin: Negative for rash.  Neurological: Positive for weakness. Negative for speech difficulty, numbness and headaches.     Physical Exam Updated Vital Signs BP (!) 169/92   Pulse 83   Temp 98.5 F (36.9 C) (Oral)   Resp 13   Ht 5\' 9"  (1.753 m)   Wt 77.1 kg   SpO2 97%   BMI 25.10 kg/m   Physical Exam Vitals signs and nursing note reviewed.  Constitutional:      General: He is not in acute distress.    Appearance: Normal appearance. He is well-developed. He is not ill-appearing.  HENT:     Head: Normocephalic and atraumatic.     Comments: No obvious head trauma.  Question mild left lower facial nasolabial fold flattening.    Nose: Nose normal.     Mouth/Throat:     Mouth: Mucous membranes are moist.  Eyes:     Extraocular Movements: Extraocular movements intact.     Pupils: Pupils are equal, round, and reactive to light.  Neck:     Musculoskeletal: Normal range of motion and neck supple. No neck rigidity or muscular tenderness.  Cardiovascular:     Rate and Rhythm: Normal rate and regular rhythm.     Heart sounds: No murmur. No friction rub. No gallop.   Pulmonary:     Effort: Pulmonary effort is normal. No respiratory distress.     Breath sounds: Normal breath sounds. No stridor. No wheezing, rhonchi or rales.  Chest:     Chest wall: No tenderness.  Abdominal:     General: Bowel sounds are normal.     Palpations: Abdomen is soft.     Tenderness: There is no abdominal tenderness. There is no guarding or rebound.  Musculoskeletal: Normal range of motion.        General: No swelling, tenderness, deformity or signs of injury.     Right lower  leg: No edema.     Left lower leg: No edema.  Lymphadenopathy:     Cervical: No cervical adenopathy.  Skin:    General: Skin is warm and dry.     Findings: No erythema or rash.  Neurological:     Mental Status: He is alert.     Comments: Oriented to person.  Answers questions appropriately.  Follows simple commands.  5/5 motor in all extremities.  Sensation intact.  Bilateral finger-to-nose testing intact.  Psychiatric:        Behavior: Behavior normal.  ED Treatments / Results  Labs (all labs ordered are listed, but only abnormal results are displayed) Labs Reviewed  CBC - Abnormal; Notable for the following components:      Result Value   WBC 3.5 (*)    RBC 2.69 (*)    Hemoglobin 6.9 (*)    HCT 24.3 (*)    MCH 25.7 (*)    MCHC 28.4 (*)    All other components within normal limits  COMPREHENSIVE METABOLIC PANEL - Abnormal; Notable for the following components:   Creatinine, Ser 1.52 (*)    Total Protein 6.3 (*)    Albumin 3.3 (*)    GFR calc non Af Amer 47 (*)    GFR calc Af Amer 55 (*)    All other components within normal limits  URINALYSIS, ROUTINE W REFLEX MICROSCOPIC - Abnormal; Notable for the following components:   Color, Urine STRAW (*)    All other components within normal limits  POC OCCULT BLOOD, ED - Abnormal; Notable for the following components:   Fecal Occult Bld POSITIVE (*)    All other components within normal limits  SARS CORONAVIRUS 2 (HOSPITAL ORDER, [facility])  PROTIME-INR  APTT  DIFFERENTIAL  RAPID URINE DRUG SCREEN, HOSP PERFORMED  OCCULT BLOOD X 1 CARD TO LAB, STOOL  PREPARE RBC (CROSSMATCH)  TYPE AND SCREEN    EKG EKG Interpretation  Date/Time:  Tuesday February 08 2019 08:24:50 EDT Ventricular Rate:  68 PR Interval:    QRS Duration: 108 QT Interval:  391 QTC Calculation: 416 R Axis:   73 Text Interpretation:  Sinus rhythm Prolonged PR interval Confirmed by Julianne Rice [phone]) on 02/08/2019  3:12:04 PM   Radiology Ct Head Wo Contrast  Result Date: 02/08/2019 CLINICAL DATA:  Focal neuro deficits, stroke suspected EXAM: CT HEAD WITHOUT CONTRAST TECHNIQUE: Contiguous axial images were obtained from the base of the skull through the vertex without intravenous contrast. COMPARISON:  11/15/2018 FINDINGS: Brain: Multiple old bilateral basal ganglia and periventricular white matter lacunar infarcts. There is atrophy and chronic small vessel disease changes. No acute intracranial abnormality. Specifically, no hemorrhage, hydrocephalus, mass lesion, acute infarction, or significant intracranial injury. Old right occipital infarct, stable. Vascular: No hyperdense vessel or unexpected calcification. Skull: No acute calvarial abnormality. Sinuses/Orbits: Visualized paranasal sinuses and mastoids clear. Orbital soft tissues unremarkable. Other: None IMPRESSION: Extensive chronic small vessel disease and numerous old lacunar infarcts. Old right occipital infarct. No acute intracranial abnormality. Electronically Signed   By: Rolm Baptise M.D.   On: 02/08/2019 10:15   Mr Brain Wo Contrast  Result Date: 02/08/2019 CLINICAL DATA:  Focal neuro deficit greater than 6 hours, stroke suspected. Left lower extremity weakness. Decreased ability to ambulate. EXAM: MRI HEAD WITHOUT CONTRAST TECHNIQUE: Multiplanar, multiecho pulse sequences of the brain and surrounding structures were obtained without intravenous contrast. COMPARISON:  MRI of the head without contrast 11/15/2018. CT head without contrast 02/08/2019 FINDINGS: Brain: Diffusion-weighted images demonstrate the late subacute/early chronic infarct of the right corona radiata. More remote lacunar infarct of the left corona radiata is again noted. There are multiple remote lacunar infarcts involving the basal ganglia and corona radiata bilaterally. Remote right occipital lobe infarct is present. Remote left PICA territory infarcts are present. No acute  intracranial abnormality is present. There is no acute hemorrhage or mass lesion. Confluent periventricular and diffuse subcortical white matter changes are again noted. The ventricles are proportionate to the degree of atrophy. No significant extraaxial fluid collection is present.  Vascular: Flow is present in the major intracranial arteries. Skull and upper cervical spine: The craniocervical junction is normal. Upper cervical spine is within normal limits. Marrow signal is unremarkable. Sinuses/Orbits: The paranasal sinuses and mastoid air cells are clear. The globes and orbits are within normal limits. IMPRESSION: 1. Evolving late subacute/early chronic right corona radiata lacunar infarct without significant change in location from the previous study. 2. No new or acute intracranial abnormalities. 3. Multiple other remote lacunar infarcts of the basal ganglia and corona radiata bilaterally are stable. 4. Age advanced atrophy and white matter disease likely reflects the sequela of chronic microvascular ischemia. 5. Remote infarcts involving the posterior right occipital lobe and the inferior left cerebellum are stable. Electronically Signed   By: San Morelle M.D.   On: 02/08/2019 13:59    Procedures Procedures (including critical care time)  Medications Ordered in ED Medications  0.9 %  sodium chloride infusion (10 mL/hr Intravenous New Bag/Given 02/08/19 1331)    CRITICAL CARE Performed by: Julianne Rice Total critical care time: 35 minutes Critical care time was exclusive of separately billable procedures and treating other patients. Critical care was necessary to treat or prevent imminent or life-threatening deterioration. Critical care was time spent personally by me on the following activities: development of treatment plan with patient and/or surrogate as well as nursing, discussions with consultants, evaluation of patient's response to treatment, examination of patient, obtaining  history from patient or surrogate, ordering and performing treatments and interventions, ordering and review of laboratory studies, ordering and review of radiographic studies, pulse oximetry and re-evaluation of patient's condition. Initial Impression / Assessment and Plan / ED Course  I have reviewed the triage vital signs and the nursing notes.  Pertinent labs & imaging results that were available during my care of the patient were reviewed by me and considered in my medical decision making (see chart for details).       MRI brain without acute findings.  Patient does have worsening anemia.  He is Hemoccult positive.  Discussed with internal medicine teaching service who will admit patient.  Patient is being transfused in the emergency department.   Final Clinical Impressions(s) / ED Diagnoses   Final diagnoses:  Anemia, unspecified type  Gastrointestinal hemorrhage, unspecified gastrointestinal hemorrhage type    ED Discharge Orders    None       Julianne Rice, MD 02/08/19 1512

## 2019-02-08 NOTE — ED Notes (Signed)
Dr. Lita Mains aware of HGB

## 2019-02-08 NOTE — ED Notes (Addendum)
MD at bedside. Aware of BP

## 2019-02-08 NOTE — ED Notes (Signed)
Pt denies pain or any complaint.

## 2019-02-08 NOTE — ED Notes (Signed)
Patient transported to CT 

## 2019-02-08 NOTE — ED Notes (Signed)
Pt cleaned of large amount of urine and 2 full briefs. Fresh linen changed.

## 2019-02-08 NOTE — Consult Note (Addendum)
Referring Provider:   Teaching Service        Primary Care Physician:  [facility] Primary Gastroenterologist:    unassigned         Reason for Consultation:  anemia  / FOBT+              ASSESSMENT /  PLAN    1.  63 yo male with dementia, recurrent CVAs, CKD and IDA. Recurrent decline in hgb and FOBT+ on Plavix. Endoscopic evaluation not done when seen in the hospital late April as patient couldn't hold plavix due to another acute CVA.  -We may need to proceed with endoscopic evaluation on Plavix at some point as it may be too risky to hold it given recurrent CVAs.  -He is getting a unit of blood now.  -agree with PPI  2. Dementia. Limited historian. If we decide to proceed with endoscopic evaluation I will discuss with brother [name].   3. C4-C5 spinal mass, followed by Neurosurgeon Dr. Arnoldo Morale   BRIEF HISTORY   Karl Miller is a 63 y.o. male with hx of dementia, HTN, CKD3, recurrent CVAs and cervical spine mass. We saw him for inpatient consultation late April 2020 for IDA on plavix and possibly meloxicam. Hgb May 2019 was 11.5, when we say him in late April it was 7, subsequently fell to 6.7 requiring transfusion. Patient discharged early May with hgb of 8.1.   ED EVALUATION  Hemodynamically stable.  WBC 3.5, hgb 6.9 BUN 20, Cr 1.53  On 11/16/18 ferritin was 10. Normal folate and B12 A uPRBC ordered  HPI:   Patient brought by EMS from Nursing home for gait disturbances. He is a limited historian. MRI brain negative for any new infarcts. Reliability of details questionable but he denies abdominal pain, blood in stool, black stool, N/V. According to home med list he is not on asa nor any other NSAID. Takes Plavix and a PPI.   PREVIOUS GASTROINTESTINAL STUDIES  none   Past Medical History:  Diagnosis Date   Acute encephalopathy 12/03/2017   [name] 12/04/2017   High cholesterol    Hypertension    Stroke [facility])    TIA (transient ischemic attack)  12/03/2017   [name] 12/04/2017    Past Surgical History:  Procedure Laterality Date   LOOP RECORDER INSERTION N/A 12/07/2017   Procedure: LOOP RECORDER INSERTION;  Surgeon: Constance Haw, MD;  Location: [facility];  Service: Cardiovascular;  Laterality: N/A;   NO PAST SURGERIES     TEE WITHOUT CARDIOVERSION N/A 12/07/2017   Procedure: TRANSESOPHAGEAL ECHOCARDIOGRAM (TEE);  Surgeon: Dorothy Spark, MD;  Location: [facility] ENDOSCOPY;  Service: Cardiovascular;  Laterality: N/A;    Prior to Admission medications   Medication Sig Start Date End Date Taking? Authorizing Provider  acetaminophen (TYLENOL) 500 MG tablet Take 500-1,000 mg by mouth every 6 (six) hours as needed for mild pain.   Yes [provider]  atorvastatin (LIPITOR) 10 MG tablet Take 1 tablet (10 mg total) by mouth daily at 6 PM. 11/19/18  Yes Meccariello, Bernita Raisin, DO  clopidogrel (PLAVIX) 75 MG tablet Take 75 mg by mouth daily.   Yes [provider]  donepezil (ARICEPT) 10 MG tablet Take 10 mg by mouth every morning.   Yes [provider]  ferrous sulfate 325 (65 FE) MG tablet Take 325 mg by mouth daily.    Yes [provider]  finasteride (PROSCAR) 5 MG tablet Take 5 mg by mouth daily.   Yes  [provider]  guaiFENesin (ROBITUSSIN) 100 MG/5ML SOLN Take 10 mLs by mouth every 4 (four) hours as needed for cough or to loosen phlegm.   Yes [provider]  loperamide (IMODIUM) 2 MG capsule Take 2 mg by mouth as needed for diarrhea or loose stools (not to exceed 8 doses in 24hrs).   Yes [provider]  magnesium hydroxide (MILK OF MAGNESIA) 400 MG/5ML suspension Take 30 mLs by mouth at bedtime as needed for mild constipation.   Yes [provider]  neomycin-bacitracin-polymyxin (NEOSPORIN) OINT Apply 1 application topically as needed for wound care.   Yes [provider]  pantoprazole (PROTONIX) 40 MG tablet Take 40 mg by mouth daily.   Yes  [provider]    No current facility-administered medications for this encounter.    Current Outpatient Medications  Medication Sig Dispense Refill   acetaminophen (TYLENOL) 500 MG tablet Take 500-1,000 mg by mouth every 6 (six) hours as needed for mild pain.     atorvastatin (LIPITOR) 10 MG tablet Take 1 tablet (10 mg total) by mouth daily at 6 PM. 30 tablet 0   clopidogrel (PLAVIX) 75 MG tablet Take 75 mg by mouth daily.     donepezil (ARICEPT) 10 MG tablet Take 10 mg by mouth every morning.     ferrous sulfate 325 (65 FE) MG tablet Take 325 mg by mouth daily.      finasteride (PROSCAR) 5 MG tablet Take 5 mg by mouth daily.     guaiFENesin (ROBITUSSIN) 100 MG/5ML SOLN Take 10 mLs by mouth every 4 (four) hours as needed for cough or to loosen phlegm.     loperamide (IMODIUM) 2 MG capsule Take 2 mg by mouth as needed for diarrhea or loose stools (not to exceed 8 doses in 24hrs).     magnesium hydroxide (MILK OF MAGNESIA) 400 MG/5ML suspension Take 30 mLs by mouth at bedtime as needed for mild constipation.     neomycin-bacitracin-polymyxin (NEOSPORIN) OINT Apply 1 application topically as needed for wound care.     pantoprazole (PROTONIX) 40 MG tablet Take 40 mg by mouth daily.      Allergies as of 02/08/2019   (No Known Allergies)    No family history on file.  Social History   Socioeconomic History   Marital status: Single    Spouse name: Not on file   Number of children: Not on file   Years of education: Not on file   Highest education level: Not on file  Occupational History   Not on file  Social Needs   Financial resource strain: Not on file   Food insecurity    Worry: Not on file    Inability: Not on file   Transportation needs    Medical: Not on file    Non-medical: Not on file  Tobacco Use   Smoking status: Current Every Day Smoker    Packs/day: 0.50    Years: 24.00    Pack years: 12.00    Types: Cigarettes   Smokeless  tobacco: Never Used  Substance and Sexual Activity   Alcohol use: No    Frequency: Never   Drug use: No   Sexual activity: Not Currently  Lifestyle   Physical activity    Days per week: Not on file    Minutes per session: Not on file   Stress: Not on file  Relationships   Social connections    Talks on phone: Not on file    Gets  together: Not on file    Attends religious service: Not on file    Active member of club or organization: Not on file    Attends meetings of clubs or organizations: Not on file    Relationship status: Not on file   Intimate partner violence    Fear of current or ex partner: Not on file    Emotionally abused: Not on file    Physically abused: Not on file    Forced sexual activity: Not on file  Other Topics Concern   Not on file  Social History Narrative   Not on file    Review of Systems: All systems reviewed and negative except where noted in HPI.  Physical Exam: Vital signs in last 24 hours: Temp:  [98.5 F (36.9 C)-98.8 F (37.1 C)] 98.5 F (36.9 C) (07/21 1401) Pulse Rate:  [43-83] 83 (07/21 1500) Resp:  [10-19] 13 (07/21 1500) BP: (147-180)/(86-110) 169/92 (07/21 1500) SpO2:  [97 %-100 %] 97 % (07/21 1500) Weight:  [77.1 kg] 77.1 kg (07/21 8295)   General:   Alert, well-developed,  Male in NAD Psych:  cooperative. Flat affect. Eyes:  Pupils equal, sclera clear, no icterus.   Conjunctiva pink. Ears:  Normal auditory acuity. Nose:  No deformity, discharge,  or lesions. Neck:  Supple; no masses Lungs:  Clear throughout to auscultation.   No wheezes, crackles, or rhonchi.  Heart:  Mild bradycardia, no lower extremity edema Abdomen:  Soft, non-distended, nontender, BS active, no palp mass Rectal:  Deferred  Msk:  Symmetrical without gross deformities. . Neurologic:  Alert and  oriented x4;  grossly normal neurologically. Skin:  Intact without significant lesions or rashes.   Intake/Output from previous day: No intake/output  data recorded. Intake/Output this shift: No intake/output data recorded.  Lab Results: Recent Labs    02/08/19 0832  WBC 3.5*  HGB 6.9*  HCT 24.3*  PLT 309   BMET Recent Labs    02/08/19 0832  NA 141  K 4.0  CL 109  CO2 26  GLUCOSE 82  BUN 20  CREATININE 1.52*  CALCIUM 9.3   LFT Recent Labs    02/08/19 0832  PROT 6.3*  ALBUMIN 3.3*  AST 18  ALT 14  ALKPHOS 39  BILITOT 0.6   PT/INR Recent Labs    02/08/19 0832  LABPROT 14.2  INR 1.1   Hepatitis Panel No results for input(s): HEPBSAG, HCVAB, HEPAIGM, HEPBIGM in the last 72 hours.   . CBC Latest Ref Rng & Units 02/08/2019 11/19/2018 11/18/2018  WBC 4.0 - 10.5 K/uL 3.5(L) 7.2 6.2  Hemoglobin 13.0 - 17.0 g/dL 6.9(LL) 8.1(L) 9.4(L)  Hematocrit 39.0 - 52.0 % 24.3(L) 25.3(L) 29.5(L)  Platelets 150 - 400 K/uL 309 312 342    . CMP Latest Ref Rng & Units 02/08/2019 11/19/2018 11/18/2018  Glucose 70 - 99 mg/dL 82 87 88  BUN 8 - 23 mg/dL 20 24(H) 21  Creatinine 0.61 - 1.24 mg/dL 1.52(H) 1.25(H) 1.36(H)  Sodium 135 - 145 mmol/L 141 141 142  Potassium 3.5 - 5.1 mmol/L 4.0 4.3 4.2  Chloride 98 - 111 mmol/L 109 110 111  CO2 22 - 32 mmol/L 26 22 27   Calcium 8.9 - 10.3 mg/dL 9.3 9.4 9.3  Total Protein 6.5 - 8.1 g/dL 6.3(L) - -  Total Bilirubin 0.3 - 1.2 mg/dL 0.6 - -  Alkaline Phos 38 - 126 U/L 39 - -  AST 15 - 41 U/L 18 - -  ALT 0 - 44  U/L 14 - -   Studies/Results: Ct Head Wo Contrast  Result Date: 02/08/2019 CLINICAL DATA:  Focal neuro deficits, stroke suspected EXAM: CT HEAD WITHOUT CONTRAST TECHNIQUE: Contiguous axial images were obtained from the base of the skull through the vertex without intravenous contrast. COMPARISON:  11/15/2018 FINDINGS: Brain: Multiple old bilateral basal ganglia and periventricular white matter lacunar infarcts. There is atrophy and chronic small vessel disease changes. No acute intracranial abnormality. Specifically, no hemorrhage, hydrocephalus, mass lesion, acute infarction, or  significant intracranial injury. Old right occipital infarct, stable. Vascular: No hyperdense vessel or unexpected calcification. Skull: No acute calvarial abnormality. Sinuses/Orbits: Visualized paranasal sinuses and mastoids clear. Orbital soft tissues unremarkable. Other: None IMPRESSION: Extensive chronic small vessel disease and numerous old lacunar infarcts. Old right occipital infarct. No acute intracranial abnormality. Electronically Signed   By: Rolm Baptise M.D.   On: 02/08/2019 10:15   Mr Brain Wo Contrast  Result Date: 02/08/2019 CLINICAL DATA:  Focal neuro deficit greater than 6 hours, stroke suspected. Left lower extremity weakness. Decreased ability to ambulate. EXAM: MRI HEAD WITHOUT CONTRAST TECHNIQUE: Multiplanar, multiecho pulse sequences of the brain and surrounding structures were obtained without intravenous contrast. COMPARISON:  MRI of the head without contrast 11/15/2018. CT head without contrast 02/08/2019 FINDINGS: Brain: Diffusion-weighted images demonstrate the late subacute/early chronic infarct of the right corona radiata. More remote lacunar infarct of the left corona radiata is again noted. There are multiple remote lacunar infarcts involving the basal ganglia and corona radiata bilaterally. Remote right occipital lobe infarct is present. Remote left PICA territory infarcts are present. No acute intracranial abnormality is present. There is no acute hemorrhage or mass lesion. Confluent periventricular and diffuse subcortical white matter changes are again noted. The ventricles are proportionate to the degree of atrophy. No significant extraaxial fluid collection is present. Vascular: Flow is present in the major intracranial arteries. Skull and upper cervical spine: The craniocervical junction is normal. Upper cervical spine is within normal limits. Marrow signal is unremarkable. Sinuses/Orbits: The paranasal sinuses and mastoid air cells are clear. The globes and orbits are  within normal limits. IMPRESSION: 1. Evolving late subacute/early chronic right corona radiata lacunar infarct without significant change in location from the previous study. 2. No new or acute intracranial abnormalities. 3. Multiple other remote lacunar infarcts of the basal ganglia and corona radiata bilaterally are stable. 4. Age advanced atrophy and white matter disease likely reflects the sequela of chronic microvascular ischemia. 5. Remote infarcts involving the posterior right occipital lobe and the inferior left cerebellum are stable. Electronically Signed   By: San Morelle M.D.   On: 02/08/2019 13:59     Tye Savoy, NP-C @  02/08/2019, 4:08 PM   Attending physician's note   I have taken a history, examined the patient and reviewed the chart. I agree with the Advanced Practitioner's note, impression and recommendations.  63 year old male with dementia, recurrent CVA on Plavix, CKD with iron deficiency anemia.  Anemia is likely multifactorial in addition to occult GI chronic blood loss  He has multiple comorbidities including cervical spine tumor, overall goals of care are unclear, please consult palliative care  If patient and family want to pursue full GI work-up, can consider EGD and colonoscopy for further evaluation  Continue supportive care  We will continue to follow  K. Denzil Magnuson , MD [phone]

## 2019-02-08 NOTE — ED Notes (Signed)
Dr. Lita Mains aware of increasing bp.

## 2019-02-08 NOTE — ED Notes (Signed)
Attempted report 

## 2019-02-08 NOTE — ED Triage Notes (Signed)
Arrives EMS from [facility] for c/o gait abnormality noted on walking with walker to breakfast this am. EMS states very minimal grip strength right greater than left. No arm drift noted by EMS. Hx of dementia and pt at baseline per NH staff. Hx of previous stroke with no known deficits.

## 2019-02-08 NOTE — ED Notes (Signed)
Patient transported to MRI 

## 2019-02-09 ENCOUNTER — Encounter: Payer: Medicare Other | Admitting: *Deleted

## 2019-02-09 DIAGNOSIS — R220 Localized swelling, mass and lump, head: Secondary | ICD-10-CM

## 2019-02-09 DIAGNOSIS — D509 Iron deficiency anemia, unspecified: Secondary | ICD-10-CM

## 2019-02-09 DIAGNOSIS — D649 Anemia, unspecified: Secondary | ICD-10-CM | POA: Diagnosis present

## 2019-02-09 DIAGNOSIS — G9589 Other specified diseases of spinal cord: Secondary | ICD-10-CM

## 2019-02-09 DIAGNOSIS — R269 Unspecified abnormalities of gait and mobility: Secondary | ICD-10-CM

## 2019-02-09 DIAGNOSIS — Z515 Encounter for palliative care: Secondary | ICD-10-CM

## 2019-02-09 DIAGNOSIS — Z7189 Other specified counseling: Secondary | ICD-10-CM

## 2019-02-09 DIAGNOSIS — F0150 Vascular dementia without behavioral disturbance: Secondary | ICD-10-CM | POA: Diagnosis present

## 2019-02-09 LAB — CBC WITH DIFFERENTIAL/PLATELET
Abs Immature Granulocytes: 0.03 10*3/uL (ref 0.00–0.07)
Basophils Absolute: 0 10*3/uL (ref 0.0–0.1)
Basophils Relative: 1 %
Eosinophils Absolute: 0.1 10*3/uL (ref 0.0–0.5)
Eosinophils Relative: 2 %
HCT: 36.1 % — ABNORMAL LOW (ref 39.0–52.0)
Hemoglobin: 11.6 g/dL — ABNORMAL LOW (ref 13.0–17.0)
Immature Granulocytes: 0 %
Lymphocytes Relative: 11 %
Lymphs Abs: 0.7 10*3/uL (ref 0.7–4.0)
MCH: 26.7 pg (ref 26.0–34.0)
MCHC: 32.1 g/dL (ref 30.0–36.0)
MCV: 83 fL (ref 80.0–100.0)
Monocytes Absolute: 0.6 10*3/uL (ref 0.1–1.0)
Monocytes Relative: 10 %
Neutro Abs: 5.1 10*3/uL (ref 1.7–7.7)
Neutrophils Relative %: 76 %
Platelets: 317 10*3/uL (ref 150–400)
RBC: 4.35 MIL/uL (ref 4.22–5.81)
RDW: 15.2 % (ref 11.5–15.5)
WBC: 6.7 10*3/uL (ref 4.0–10.5)
nRBC: 0 % (ref 0.0–0.2)

## 2019-02-09 LAB — RETICULOCYTES
Immature Retic Fract: 25 % — ABNORMAL HIGH (ref 2.3–15.9)
RBC.: 3.78 MIL/uL — ABNORMAL LOW (ref 4.22–5.81)
Retic Count, Absolute: 90 10*3/uL (ref 19.0–186.0)
Retic Ct Pct: 2.4 % (ref 0.4–3.1)

## 2019-02-09 LAB — BASIC METABOLIC PANEL
Anion gap: 8 (ref 5–15)
BUN: 17 mg/dL (ref 8–23)
CO2: 26 mmol/L (ref 22–32)
Calcium: 9.7 mg/dL (ref 8.9–10.3)
Chloride: 105 mmol/L (ref 98–111)
Creatinine, Ser: 1.35 mg/dL — ABNORMAL HIGH (ref 0.61–1.24)
GFR calc Af Amer: >60 mL/min (ref >60)
GFR calc non Af Amer: 55 mL/min — ABNORMAL LOW (ref >60)
Glucose, Bld: 92 mg/dL (ref 70–99)
Potassium: 3.3 mmol/L — ABNORMAL LOW (ref 3.5–5.1)
Sodium: 139 mmol/L (ref 135–145)

## 2019-02-09 LAB — TYPE AND SCREEN
ABO/RH(D): O NEG
Antibody Screen: NEGATIVE
Unit division: 0
Unit division: 0

## 2019-02-09 LAB — CBC
HCT: 31.7 % — ABNORMAL LOW (ref 39.0–52.0)
Hemoglobin: 9.9 g/dL — ABNORMAL LOW (ref 13.0–17.0)
MCH: 26.3 pg (ref 26.0–34.0)
MCHC: 31.2 g/dL (ref 30.0–36.0)
MCV: 84.1 fL (ref 80.0–100.0)
Platelets: 301 10*3/uL (ref 150–400)
RBC: 3.77 MIL/uL — ABNORMAL LOW (ref 4.22–5.81)
RDW: 15.2 % (ref 11.5–15.5)
WBC: 6.2 10*3/uL (ref 4.0–10.5)
nRBC: 0 % (ref 0.0–0.2)

## 2019-02-09 LAB — IRON AND TIBC
Iron: 41 ug/dL — ABNORMAL LOW (ref 45–182)
Saturation Ratios: 11 % — ABNORMAL LOW (ref 17.9–39.5)
TIBC: 375 ug/dL (ref 250–450)
UIBC: 334 ug/dL

## 2019-02-09 LAB — BPAM RBC
Blood Product Expiration Date: 202007282359
Blood Product Expiration Date: 202008162359
ISSUE DATE / TIME: 202007211329
ISSUE DATE / TIME: 202007211746
Unit Type and Rh: 5100
Unit Type and Rh: 5100

## 2019-02-09 LAB — FERRITIN: Ferritin: 13 ng/mL — ABNORMAL LOW (ref 24–336)

## 2019-02-09 LAB — HIV ANTIBODY (ROUTINE TESTING W REFLEX): HIV Screen 4th Generation wRfx: NONREACTIVE

## 2019-02-09 MED ORDER — SODIUM CHLORIDE 0.9 % IV SOLN
510.0000 mg | Freq: Once | INTRAVENOUS | Status: AC
  Administered 2019-02-09: 05:00:00 510 mg via INTRAVENOUS
  Filled 2019-02-09: qty 17

## 2019-02-09 MED ORDER — POTASSIUM CHLORIDE CRYS ER 20 MEQ PO TBCR
40.0000 meq | EXTENDED_RELEASE_TABLET | Freq: Once | ORAL | Status: AC
  Administered 2019-02-09: 40 meq via ORAL
  Filled 2019-02-09: qty 2

## 2019-02-09 NOTE — Consult Note (Signed)
Consultation Note Date: 02/09/2019   Patient Name: Karl Miller  DOB: 1955/10/09  MRN: [id number]  Age / Sex: 63 y.o., male  PCP: [facility] Referring Physician: Sid Falcon, MD  Reason for Consultation: Establishing goals of care  HPI/Patient Profile: 63 y.o. male  with past medical history of C4-5 tumor (found incidentally during April admission-neurosurgery consulted- surgery not felt to be necessary- patient was to followup outpatient, multiple CVA's, HTN, vascular dementia (resides at [facility] admitted on 02/08/2019 with gait disturbance. Workup thus far reveals decreased Hgb- down to 6.9- FOB positive- back to 11.6 now after transfusion. GI concerned about risk of endoscopy due to patient on plavix. Palliative medicine consult for [place].    Clinical Assessment and Goals of Care:  I have reviewed medical records including EPIC notes, labs and imaging, received report from, assessed the patient and then called patient's guardian- his brother- Karl Miller- to discuss [place].   As far as functional and nutritional status- Karl Miller was unsure of patient's current baseline due to being separated from him due to Covid restrictions. Per chart review, examination and nursing reports- patient ambulates with walker- but was having some tilting that lead to this admission. He is eating well. He is oriented to person and place, not time or situation. He can minimally carry a conversation. He is a very poor historian and cannot make decisions for himself.     We discussed Tommy's current illness and what it means in the larger context of his overall trajectory. Karl Miller shares that Tommy's dementia is not specifically related to his strokes. He states that Konrad Dolores was "always a shade off" and required care. We discussed Tommy's GI bleeding and the options and risks of further testing. Karl Miller has valid questions  and states he believes he would like to pursue further testing if the testing would result in a treatment that could prevent rehospitalization. He would like to discuss this Tommy's GI doctor.  We discussed the incidental finding of the C4-C5 tumor in April- Karl Miller was not made aware of this finding during that admission. Patient has not followed up with nuerosurgery on outpatient.  I attempted to elicit values and goals of care important to the patient. Karl Miller wishes to continue full scope care for patient.  He notes Konrad Dolores has good quality of life. If patient were to require rehab before returning to [facility] he would be open to that.   Advanced directives, concepts specific to code status, artifical feeding and hydration, and rehospitalization were considered and discussed. Karl Miller wishes to continue full code. He has discussed possibility of DNR with patient's mother- but they continue to desire full code.  Karl Miller agrees to outpatient palliate followup for patient at his discharge venue for continued layer of support and continued [place].   Primary Decision Maker LEGAL GUARDIAN- patient's brother- Karl Miller, Karl Miller    SUMMARY OF RECOMMENDATIONS -[place]- full scope care -Legal guardian would like to speak with GI re: endoscopy- please see discussion above  -Full code -Recommend outpatient Palliative for  continued [place] was offered to brother due to patient not being able to make decisions for himself- brother did not appear to be interested  Code Status/Advance Care Planning:  Full code  Prognosis:    Unable to determine  Discharge Planning: Assisted living with Palliative  Primary Diagnoses: Present on Admission: . GI bleed   I have reviewed the medical record, interviewed the patient and family, and examined the patient. The following aspects are pertinent.  Past Medical History:  Diagnosis Date  . Acute encephalopathy 12/03/2017   [name] 12/04/2017  .  High cholesterol   . Hypertension   . Stroke ([place])   . TIA (transient ischemic attack) 12/03/2017   [name] 12/04/2017   Social History   Socioeconomic History  . Marital status: Single    Spouse name: Not on file  . Number of children: Not on file  . Years of education: Not on file  . Highest education level: Not on file  Occupational History  . Not on file  Social Needs  . Financial resource strain: Not on file  . Food insecurity    Worry: Not on file    Inability: Not on file  . Transportation needs    Medical: Not on file    Non-medical: Not on file  Tobacco Use  . Smoking status: Current Every Day Smoker    Packs/day: 0.50    Years: 24.00    Pack years: 12.00    Types: Cigarettes  . Smokeless tobacco: Never Used  Substance and Sexual Activity  . Alcohol use: No    Frequency: Never  . Drug use: No  . Sexual activity: Not Currently  Lifestyle  . Physical activity    Days per week: Not on file    Minutes per session: Not on file  . Stress: Not on file  Relationships  . Social Herbalist on phone: Not on file    Gets together: Not on file    Attends religious service: Not on file    Active member of club or organization: Not on file    Attends meetings of clubs or organizations: Not on file    Relationship status: Not on file  Other Topics Concern  . Not on file  Social History Narrative  . Not on file   No family history on file. Scheduled Meds: . atorvastatin  10 mg Oral q1800  . clopidogrel  75 mg Oral Daily  . donepezil  10 mg Oral Daily  . finasteride  5 mg Oral Daily  . pantoprazole (PROTONIX) IV  40 mg Intravenous Q12H  . sodium chloride flush  3 mL Intravenous Q12H   Continuous Infusions: PRN Meds:.acetaminophen **OR** acetaminophen Medications Prior to Admission:  Prior to Admission medications   Medication Sig Start Date End Date Taking? Authorizing Provider  acetaminophen (TYLENOL) 500 MG tablet Take 500-1,000 mg by mouth every 6  (six) hours as needed for mild pain.   Yes [provider]  atorvastatin (LIPITOR) 10 MG tablet Take 1 tablet (10 mg total) by mouth daily at 6 PM. 11/19/18  Yes Meccariello, Bernita Raisin, DO  clopidogrel (PLAVIX) 75 MG tablet Take 75 mg by mouth daily.   Yes [provider]  donepezil (ARICEPT) 10 MG tablet Take 10 mg by mouth every morning.   Yes [provider]  ferrous sulfate 325 (65 FE) MG tablet Take 325 mg by mouth daily.    Yes [provider]  finasteride (PROSCAR) 5 MG  tablet Take 5 mg by mouth daily.   Yes [provider]  guaiFENesin (ROBITUSSIN) 100 MG/5ML SOLN Take 10 mLs by mouth every 4 (four) hours as needed for cough or to loosen phlegm.   Yes [provider]  loperamide (IMODIUM) 2 MG capsule Take 2 mg by mouth as needed for diarrhea or loose stools (not to exceed 8 doses in 24hrs).   Yes [provider]  magnesium hydroxide (MILK OF MAGNESIA) 400 MG/5ML suspension Take 30 mLs by mouth at bedtime as needed for mild constipation.   Yes [provider]  neomycin-bacitracin-polymyxin (NEOSPORIN) OINT Apply 1 application topically as needed for wound care.   Yes [provider]  pantoprazole (PROTONIX) 40 MG tablet Take 40 mg by mouth daily.   Yes [provider]   No Known Allergies   Vital Signs: BP (!) 144/93 (BP Location: Left Arm)   Pulse 72   Temp 98.5 F (36.9 C) (Oral)   Resp 20   Ht 5\' 9"  (1.753 m)   Wt 77.1 kg   SpO2 100%   BMI 25.10 kg/m  Pain Scale: 0-10   Pain Score: 0-No pain   SpO2: SpO2: 100 % O2 Device:SpO2: 100 % O2 Flow Rate: .   IO: Intake/output summary:   Intake/Output Summary (Last 24 hours) at 02/09/2019 1643 Last data filed at 02/09/2019 1305 Gross per 24 hour  Intake 1202 ml  Output 2000 ml  Net -798 ml    LBM: Last BM Date: (unknown ) Baseline Weight: Weight: 77.1 kg Most recent weight: Weight: 77.1 kg     Palliative Assessment/Data: PPS: 40%      Thank you for this consult. Palliative medicine will continue to follow and assist as needed.   Time In: 1630 Time Out: 1745 Time Total: 75 minutes Greater than 50%  of this time was spent counseling and coordinating care related to the above assessment and plan.  Signed by: Mariana Kaufman, AGNP-C Palliative Medicine    Please contact Palliative Medicine Team phone at [phone] for questions and concerns.  For individual provider: See Shea Evans

## 2019-02-09 NOTE — Progress Notes (Signed)
  Date: 02/09/2019  Patient name: Karl Miller  Medical record number: [id number]  Date of birth: 02/20/1956   I have seen and evaluated Karl Miller and discussed their care with the Residency Team. Briefly, Karl Miller is a 63 year old man with PMH of HTN, HLD, CKD3, dementia, chronic anemia who presented for gait abnormality.  He was found to have an acute worsening of his anemia and brain imaging did not show any new strokes.  Possibly he had symptomatic anemia which caused his gait abnormality.  He had 2 units of PRBC and his H/H improved.  He was seen by GI.  Given his recent strokes, it is not currently advisable to stop his plavix.    Soc Hx : Lives at [facility].    Vitals:   02/09/19 0320 02/09/19 0824  BP: (!) 150/75 139/83  Pulse: 63 65  Resp:  18  Temp: 98.2 F (36.8 C) 98.4 F (36.9 C)  SpO2: 100% 100%   General: Elderly man, lying in bed, no distress Eyes: Anicteric sclerae CV: RR, NR, no murmur Pulm: CTAB, no wheezing Abd: Soft, +BS, NT MSK: Decreased muscle tone Neuro: Strength appears intact in upper extremities, including grip strength.  He is oriented to person and place.    Assessment and Plan: I have seen and evaluated the patient as outlined above. I agree with the formulated Assessment and Plan as detailed in the residents' note, with the following changes:   1. Acute on chronic symptomatic anemia due to GI bleeding - CBC after transfusion, then q 12 - Unclear source, possibly diverticulosis vs. Upper GI source - Unable to hold plavix, GI is concerned about bleeding risk during endoscopy - Will plan for family discussion regarding risk/benefit with the palliative care team today.  - IV protonix BID  2. CVA, vascular dementia - Continue home medications including statin, plavix, donepezil  Other issues per medical students note.   Karl Falcon, MD 7/22/202012:36 PM

## 2019-02-09 NOTE — Progress Notes (Addendum)
Subjective:  Patient was examined by the bedside this morning. Per nursing, patient had altered mental status this morning. During our visit, patient was consistently attempting to leave bed. He was oriented x 2 to person and month. He knew he was in a hospital but could not tell me where. His status has changed from the ED where he was easily redirectable. A sitter has been ordered and delirium precautions are in place.    No other acute events overnight. Patient still has no complaints.   His brother [name]) called this morning to speak with him.   Objective:  Vital signs in last 24 hours: Vitals:   02/08/19 2135 02/08/19 2141 02/08/19 2341 02/09/19 0320  BP: (!) 193/107 (!) 164/100 (!) 164/98 (!) 150/75  Pulse: (!) 57 (!) 48 (!) 57 63  Resp: 14 17 17    Temp:  98 F (36.7 C) 98 F (36.7 C) 98.2 F (36.8 C)  TempSrc:  Oral Oral Oral  SpO2: 99% 98% 100% 100%  Weight:      Height:       Weight change:   Intake/Output Summary (Last 24 hours) at 02/09/2019 5400 Last data filed at 02/09/2019 0600 Gross per 24 hour  Intake 482 ml  Output 2000 ml  Net -1518 ml   Physical Examination:  General: Patient seems confused but in no active distress HEENT: Normocephalic w/two existing nodules on L. forehead. MMM. PERRL. Normal EOM. Pale conjunctivae have improved from yesterday.  CV: Normal rate, regular rhythm. Normal S1, S2. No M/R/G.  Pulmonary: CTAB. Normal respiratory expansion. No wheezes or rales. Abdominal: Soft. NABS. No abdominal tenderness or distension. No rebound or guarding  Neurological: Patient is alert and oriented to person and year. He has normal sensation in all extremities. 5/5 motor strength in all extremities. Cranial nerve II - XII intact.  Extremities: No peripheral edema noted. Cold fingers and toes. No lesions noted on extremities.   Assessment/Plan:  Active Problems:   GI bleed  Karl Miller is a 63 year old man with a PMHx significant for vascular  dementia, possible alzheimer's disease, CVA, HTN, HLD, and recurring anemia who is currently receiving treatment for iron deficiency anemia. Patients hemoglobin improved from 6.9 to 9.9 following transfusion with 1 unit of packed rbc. Patient is stable but had an occurence altered mental status this morning.   Chronic Anemia/GI Bleed: Patient was transfused with 1 unit packed RBC. His hemoglobin improved from 6.9 to 9.9 this morning. Patient still has conjunctival pallor on physical exam. We would like to consult palliative medicine to understand family's goal for patient prior to GI work-up/treatment for presumed GI bleed. - monitor with CBC this afternoon to note progression of blood loss - start 40 mg IV pantoprazole Q12h  - consult palliative medicine for goals of care for patient  CVA/Vascular Dementia/Alzheimer's Disease: Patient has a lengthy past medical history of strokes beginning in 2014. Prior documentation from neurologist suggests a component of vascular dementia due to recurrent strokes as well as possible Alzheimer's. Given lack of new MRI, CT findings and patient's limitations as historian regarding reported weakness and gait instability, we are not able to localize a source for the reported admitting symptoms and will continue to monitor for these symptoms during [facility] hospital course. Our current theory is that patient's anemia unmasked some deficits from prior strokes which would help explain a transient gait abnormality at his nursing home. Patient was altered this morning but examination did not identify any new  deficits  - monitor with daily, thourough neurological examination - continue home 75 mg oral plavix QD - continue home 10 mg oral atorvastatin QD - continue at home 10 mg oral  donepezil QD - delirium precautions in place  Hypertension - hold patients home lisinopril   Hyperlipidemia - continue home med of 10 mg atorvastatin QD @ 6pm  BPH: Patient's  brother mentioned an upcoming urology appointment to address prostate cancer vs. BPH due to high PSA levels. Patient currently prescribed finasteride - continue at home 5 mg finasteride tablet QD   LOS: 1 day   BrinsonMarijean Bravo, Medical Student 02/09/2019, 7:13 AM

## 2019-02-10 DIAGNOSIS — F0150 Vascular dementia without behavioral disturbance: Secondary | ICD-10-CM

## 2019-02-10 DIAGNOSIS — D500 Iron deficiency anemia secondary to blood loss (chronic): Secondary | ICD-10-CM

## 2019-02-10 DIAGNOSIS — R195 Other fecal abnormalities: Secondary | ICD-10-CM | POA: Diagnosis present

## 2019-02-10 DIAGNOSIS — G959 Disease of spinal cord, unspecified: Secondary | ICD-10-CM

## 2019-02-10 LAB — BASIC METABOLIC PANEL
Anion gap: 7 (ref 5–15)
BUN: 19 mg/dL (ref 8–23)
CO2: 24 mmol/L (ref 22–32)
Calcium: 9.8 mg/dL (ref 8.9–10.3)
Chloride: 108 mmol/L (ref 98–111)
Creatinine, Ser: 1.4 mg/dL — ABNORMAL HIGH (ref 0.61–1.24)
GFR calc Af Amer: >60 mL/min (ref >60)
GFR calc non Af Amer: 52 mL/min — ABNORMAL LOW (ref >60)
Glucose, Bld: 87 mg/dL (ref 70–99)
Potassium: 4.3 mmol/L (ref 3.5–5.1)
Sodium: 139 mmol/L (ref 135–145)

## 2019-02-10 LAB — CBC WITH DIFFERENTIAL/PLATELET
Abs Immature Granulocytes: 0.03 10*3/uL (ref 0.00–0.07)
Basophils Absolute: 0 10*3/uL (ref 0.0–0.1)
Basophils Relative: 0 %
Eosinophils Absolute: 0.3 10*3/uL (ref 0.0–0.5)
Eosinophils Relative: 4 %
HCT: 34.4 % — ABNORMAL LOW (ref 39.0–52.0)
Hemoglobin: 10.5 g/dL — ABNORMAL LOW (ref 13.0–17.0)
Immature Granulocytes: 1 %
Lymphocytes Relative: 11 %
Lymphs Abs: 0.7 10*3/uL (ref 0.7–4.0)
MCH: 26 pg (ref 26.0–34.0)
MCHC: 30.5 g/dL (ref 30.0–36.0)
MCV: 85.1 fL (ref 80.0–100.0)
Monocytes Absolute: 0.8 10*3/uL (ref 0.1–1.0)
Monocytes Relative: 13 %
Neutro Abs: 4.5 10*3/uL (ref 1.7–7.7)
Neutrophils Relative %: 71 %
Platelets: 298 10*3/uL (ref 150–400)
RBC: 4.04 MIL/uL — ABNORMAL LOW (ref 4.22–5.81)
RDW: 15.2 % (ref 11.5–15.5)
WBC: 6.3 10*3/uL (ref 4.0–10.5)
nRBC: 0 % (ref 0.0–0.2)

## 2019-02-10 MED ORDER — RAMELTEON 8 MG PO TABS
8.0000 mg | ORAL_TABLET | Freq: Once | ORAL | Status: DC
  Filled 2019-02-10: qty 1

## 2019-02-10 MED ORDER — POLYETHYLENE GLYCOL 3350 17 GM/SCOOP PO POWD
1.0000 | Freq: Once | ORAL | Status: AC
  Administered 2019-02-10: 255 g via ORAL
  Filled 2019-02-10: qty 255

## 2019-02-10 MED ORDER — BISACODYL 5 MG PO TBEC
20.0000 mg | DELAYED_RELEASE_TABLET | Freq: Once | ORAL | Status: AC
  Administered 2019-02-10: 20 mg via ORAL
  Filled 2019-02-10: qty 4

## 2019-02-10 MED ORDER — METOCLOPRAMIDE HCL 5 MG/ML IJ SOLN
10.0000 mg | Freq: Once | INTRAMUSCULAR | Status: AC
  Administered 2019-02-10: 10 mg via INTRAVENOUS
  Filled 2019-02-10: qty 2

## 2019-02-10 MED ORDER — PANTOPRAZOLE SODIUM 40 MG PO TBEC
40.0000 mg | DELAYED_RELEASE_TABLET | Freq: Every day | ORAL | Status: DC
  Administered 2019-02-11 – 2019-02-14 (×4): 40 mg via ORAL
  Filled 2019-02-10 (×4): qty 1

## 2019-02-10 MED ORDER — METOCLOPRAMIDE HCL 5 MG/ML IJ SOLN
10.0000 mg | Freq: Once | INTRAMUSCULAR | Status: AC
  Administered 2019-02-10: 18:00:00 10 mg via INTRAVENOUS
  Filled 2019-02-10: qty 2

## 2019-02-10 NOTE — H&P (View-Only) (Signed)
Daily Rounding Note  02/10/2019, 10:10 AM  LOS: 2 days   SUBJECTIVE:   Chief complaint:    Iron deficiency anemia Patient feels well.  Denies abdominal pain.  No stools today.  OBJECTIVE:         Vital signs in last 24 hours:    Temp:  [97.5 F (36.4 C)-98.7 F (37.1 C)] 98.6 F (37 C) (07/23 0842) Pulse Rate:  [53-72] 64 (07/23 0842) Resp:  [16-20] 16 (07/23 0842) BP: (139-164)/(90-102) 158/102 (07/23 0842) SpO2:  [98 %-100 %] 100 % (07/23 0842) Last BM Date: (unknown ) Filed Weights   02/08/19 0947  Weight: 77.1 kg   General: Pleasant, alert, comfortable.  Does not look ill. Heart: RRR. Chest: Clear bilaterally, no labored breathing. Abdomen: Soft, nonobese.  No tenderness or distention.  Active bowel sounds.  No organomegaly. Extremities: No CCE. Neuro/Psych: Alert.  Oriented to self and place and somewhat to the situation.  Left when I made a joke.  Intake/Output from previous day: 07/22 0701 - 07/23 0700 In: 1320 [P.O.:1320] Out: 900 [Urine:900]  Intake/Output this shift: No intake/output data recorded.  Lab Results: Recent Labs    02/09/19 0136 02/09/19 1508 02/10/19 0712  WBC 6.2 6.7 6.3  HGB 9.9* 11.6* 10.5*  HCT 31.7* 36.1* 34.4*  PLT 301 317 298   BMET Recent Labs    02/08/19 0832 02/09/19 0136 02/10/19 0712  NA 141 139 139  K 4.0 3.3* 4.3  CL 109 105 108  CO2 26 26 24   GLUCOSE 82 92 87  BUN 20 17 19   CREATININE 1.52* 1.35* 1.40*  CALCIUM 9.3 9.7 9.8   LFT Recent Labs    02/08/19 0832  PROT 6.3*  ALBUMIN 3.3*  AST 18  ALT 14  ALKPHOS 39  BILITOT 0.6   PT/INR Recent Labs    02/08/19 0832  LABPROT 14.2  INR 1.1   Hepatitis Panel No results for input(s): HEPBSAG, HCVAB, HEPAIGM, HEPBIGM in the last 72 hours.  Studies/Results: Mr Brain Wo Contrast  Result Date: 02/08/2019 CLINICAL DATA:  Focal neuro deficit greater than 6 hours, stroke suspected. Left lower  extremity weakness. Decreased ability to ambulate. EXAM: MRI HEAD WITHOUT CONTRAST TECHNIQUE: Multiplanar, multiecho pulse sequences of the brain and surrounding structures were obtained without intravenous contrast. COMPARISON:  MRI of the head without contrast 11/15/2018. CT head without contrast 02/08/2019 FINDINGS: Brain: Diffusion-weighted images demonstrate the late subacute/early chronic infarct of the right corona radiata. More remote lacunar infarct of the left corona radiata is again noted. There are multiple remote lacunar infarcts involving the basal ganglia and corona radiata bilaterally. Remote right occipital lobe infarct is present. Remote left PICA territory infarcts are present. No acute intracranial abnormality is present. There is no acute hemorrhage or mass lesion. Confluent periventricular and diffuse subcortical white matter changes are again noted. The ventricles are proportionate to the degree of atrophy. No significant extraaxial fluid collection is present. Vascular: Flow is present in the major intracranial arteries. Skull and upper cervical spine: The craniocervical junction is normal. Upper cervical spine is within normal limits. Marrow signal is unremarkable. Sinuses/Orbits: The paranasal sinuses and mastoid air cells are clear. The globes and orbits are within normal limits. IMPRESSION: 1. Evolving late subacute/early chronic right corona radiata lacunar infarct without significant change in location from the previous study. 2. No new or acute intracranial abnormalities. 3. Multiple other remote lacunar infarcts of the basal ganglia and corona radiata bilaterally  are stable. 4. Age advanced atrophy and white matter disease likely reflects the sequela of chronic microvascular ischemia. 5. Remote infarcts involving the posterior right occipital lobe and the inferior left cerebellum are stable. Electronically Signed   By: San Morelle M.D.   On: 02/08/2019 13:59    ASSESMENT:    *  IDA.  Acute on chronic anemia. FOBT + but no melena, bleeding PR, hematemesis etc.     *   Chronic Plavix.  For recurrent CVA, vascular dementia.  Plavix not on hold.     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    *   Dementia.  Decisions made by [facility]  Mother is Gurshan Settlemire  [phone] Brother, legal guardian is Benjiman Sedgwick [place]: full code outpt pall care.     PLAN   *   EGD, colonoscopy?? Brother wants to discuss these with GI.  Pt seems agreeable.      Azucena Freed  02/10/2019, 10:10 AM Phone [phone]   Attending physician's note   I have taken an interval history, reviewed the chart and examined the patient. I agree with the Advanced Practitioner's note, impression and recommendations.   Called his brother Sammuel Bailiff) and discussed in detail the possible etiology for iron deficiency anemia due to chronic occult GI blood loss, the potential benefit and risk associated with endoscopic evaluation.  He has history of recurrent CVA, had an episode while on Plavix.  He has high thrombotic risk, hence we will plan to continue Plavix and do EGD and colonoscopy while he is still on Plavix. Discussed with him the risk for potential bleeding, need for additional blood transfusion if that happens.  Will do the bowel prep and plan for EGD and colonoscopy tomorrow The risks and benefits as well as alternatives of endoscopic procedure(s) have been discussed and reviewed. All questions answered. The patient and family agrees to proceed.   Damaris Hippo , MD [phone]

## 2019-02-10 NOTE — Progress Notes (Addendum)
Subjective:  Patient was examined at the bedside this morning while resting in bed. No acute overnight events per chart review. He was short during conversation and had trouble elaborating this morning. Oriented to only person today. Patient stated the year was 2000 and thought he was at school. No sitter in room this morning. Patient denied chest palpitations, abdominal pain, weakness. He did note that he felt very cold. He had no questions.   We told him the plan was to speak with GI doctors so we could evaluate where he was bleeding. He would like for [place] to call his brother today to update him on his current hospital course.    Objective:  Vital signs in last 24 hours: Vitals:   02/09/19 1811 02/09/19 2017 02/09/19 2316 02/10/19 0439  BP: (!) 154/97 (!) 162/100 (!) 164/99 139/90  Pulse: (!) 56 63 (!) 59 (!) 53  Resp: 16 17 17 17   Temp: 98.2 F (36.8 C) (!) 97.5 F (36.4 C) 98.7 F (37.1 C) 98.4 F (36.9 C)  TempSrc: Oral Oral Oral Axillary  SpO2: 100% 100% 100% 98%  Weight:      Height:        Intake/Output Summary (Last 24 hours) at 02/10/2019 0175 Last data filed at 02/10/2019 1025 Gross per 24 hour  Intake 1320 ml  Output 900 ml  Net 420 ml   Physical Exam:  General: Patient was lethargic and in no active distress HEENT: Normocephalic w/two existing nodules on L. forehead. MMM. PERRL. Conjunctival pallor has improved but still present CV: Normal rate, regular rhythm. Normal S1, S2. No M/R/G.  Pulmonary: CTAB in upper lung lobes. Normal respiratory expansion. No wheezes or rales. Abdominal: Soft. NABS. No abdominal tenderness or distension. No rebound or guarding  Neurological: Patient is alert and oriented to person and year. He has normal sensation in all extremities. 5/5 grip strength.  Extremities: No peripheral edema noted. Warm hands and feet. No lesions noted on extremities.  Assessment/Plan:  Active Problems:   GI bleed   Anemia   Vascular dementia  without behavioral disturbance (HCC)   Advanced care planning/counseling discussion   Goals of care, counseling/discussion   Cervical spinal mass [facility])   Palliative care by specialist  Karl Miller is a 63 year old man with a PMHx significant for vascular dementia, possible alzheimer's disease, CVA, HTN, HLD, and recurring anemia who is currently receiving treatment for iron deficiency anemia. Patients hemoglobin decreased to 10.5 from 11.6 this morning. Patient is stable this morning but was only oriented to person.   Chronic Anemia/GI Bleed: Patient was transfused with 1 unit packed RBC and given 510 mg IV feraheme 7/21. His hemoglobin improved from 9.9 to 11.6 07/22. This morning his hemoglobin was 10.5. Patient still has conjunctival pallor on physical exam. Palliative medicine had a discussion with patient's brother [name]) regarding long term health goals. Patient's brother would like to proceed with GI work-up but would like to speak with GI prior to procedure.  - continue monitoring CBC twice daily to monitor hemoglobin  - start 40 mg IV pantoprazole Q12h  - GI consulted this morning  CVA/Vascular Dementia/Alzheimer's Disease: Patient has a lengthy past medical history of strokes beginning in 2014. Prior documentation from neurologist suggests a component of vascular dementia due to recurrent strokes as well as possible Alzheimer's. Given lack of new MRI, CT findings and patient's limitations as historian regarding reported weakness and gait instability, we are not able to localize a source for the  reported admitting symptoms and will continue to monitor for these symptoms during [facility] hospital course. Our current theory is that patient's anemia unmasked some deficits from prior strokes which would help explain a transient gait abnormality at his nursing home.  - monitor with daily, thourough neurological examination - continue home 75 mg oral plavix QD - continue home 10 mg oral  atorvastatin QD - continue at home 10 mg oral donepezil QD - delirium precautions in place  Hypertension - continue to hold patients home lisinopril   Hyperlipidemia - continue home med of 10 mg atorvastatin QD @ 6pm  BPH: Patient's brother mentioned an upcoming urology appointment to address prostate cancer vs. BPH due to high PSA levels. Patient currently prescribed finasteride - continue at home 5 mg finasteride tablet QD   LOS: 2 days   Karl Miller, Karl Miller, Medical Student 02/10/2019, 8:11 AM

## 2019-02-10 NOTE — Progress Notes (Addendum)
Daily Rounding Note  02/10/2019, 10:10 AM  LOS: 2 days   SUBJECTIVE:   Chief complaint:    Iron deficiency anemia Patient feels well.  Denies abdominal pain.  No stools today.  OBJECTIVE:         Vital signs in last 24 hours:    Temp:  [97.5 F (36.4 C)-98.7 F (37.1 C)] 98.6 F (37 C) (07/23 0842) Pulse Rate:  [53-72] 64 (07/23 0842) Resp:  [16-20] 16 (07/23 0842) BP: (139-164)/(90-102) 158/102 (07/23 0842) SpO2:  [98 %-100 %] 100 % (07/23 0842) Last BM Date: (unknown ) Filed Weights   02/08/19 4098  Weight: 77.1 kg   General: Pleasant, alert, comfortable.  Does not look ill. Heart: RRR. Chest: Clear bilaterally, no labored breathing. Abdomen: Soft, nonobese.  No tenderness or distention.  Active bowel sounds.  No organomegaly. Extremities: No CCE. Neuro/Psych: Alert.  Oriented to self and place and somewhat to the situation.  Left when I made a joke.  Intake/Output from previous day: 07/22 0701 - 07/23 0700 In: 1320 [P.O.:1320] Out: 900 [Urine:900]  Intake/Output this shift: No intake/output data recorded.  Lab Results: Recent Labs    02/09/19 0136 02/09/19 1508 02/10/19 0712  WBC 6.2 6.7 6.3  HGB 9.9* 11.6* 10.5*  HCT 31.7* 36.1* 34.4*  PLT 301 317 298   BMET Recent Labs    02/08/19 0832 02/09/19 0136 02/10/19 0712  NA 141 139 139  K 4.0 3.3* 4.3  CL 109 105 108  CO2 26 26 24   GLUCOSE 82 92 87  BUN 20 17 19   CREATININE 1.52* 1.35* 1.40*  CALCIUM 9.3 9.7 9.8   LFT Recent Labs    02/08/19 0832  PROT 6.3*  ALBUMIN 3.3*  AST 18  ALT 14  ALKPHOS 39  BILITOT 0.6   PT/INR Recent Labs    02/08/19 0832  LABPROT 14.2  INR 1.1   Hepatitis Panel No results for input(s): HEPBSAG, HCVAB, HEPAIGM, HEPBIGM in the last 72 hours.  Studies/Results: Mr Brain Wo Contrast  Result Date: 02/08/2019 CLINICAL DATA:  Focal neuro deficit greater than 6 hours, stroke suspected. Left lower  extremity weakness. Decreased ability to ambulate. EXAM: MRI HEAD WITHOUT CONTRAST TECHNIQUE: Multiplanar, multiecho pulse sequences of the brain and surrounding structures were obtained without intravenous contrast. COMPARISON:  MRI of the head without contrast 11/15/2018. CT head without contrast 02/08/2019 FINDINGS: Brain: Diffusion-weighted images demonstrate the late subacute/early chronic infarct of the right corona radiata. More remote lacunar infarct of the left corona radiata is again noted. There are multiple remote lacunar infarcts involving the basal ganglia and corona radiata bilaterally. Remote right occipital lobe infarct is present. Remote left PICA territory infarcts are present. No acute intracranial abnormality is present. There is no acute hemorrhage or mass lesion. Confluent periventricular and diffuse subcortical white matter changes are again noted. The ventricles are proportionate to the degree of atrophy. No significant extraaxial fluid collection is present. Vascular: Flow is present in the major intracranial arteries. Skull and upper cervical spine: The craniocervical junction is normal. Upper cervical spine is within normal limits. Marrow signal is unremarkable. Sinuses/Orbits: The paranasal sinuses and mastoid air cells are clear. The globes and orbits are within normal limits. IMPRESSION: 1. Evolving late subacute/early chronic right corona radiata lacunar infarct without significant change in location from the previous study. 2. No new or acute intracranial abnormalities. 3. Multiple other remote lacunar infarcts of the basal ganglia and corona radiata bilaterally  are stable. 4. Age advanced atrophy and white matter disease likely reflects the sequela of chronic microvascular ischemia. 5. Remote infarcts involving the posterior right occipital lobe and the inferior left cerebellum are stable. Electronically Signed   By: San Morelle M.D.   On: 02/08/2019 13:59    ASSESMENT:    *  IDA.  Acute on chronic anemia. FOBT + but no melena, bleeding PR, hematemesis etc.     *   Chronic Plavix.  For recurrent CVA, vascular dementia.  Plavix not on hold.     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    *   Dementia.  Decisions made by [facility]  Mother is Karl Miller  [phone] Brother, legal guardian is Cullan Launer [place]: full code outpt pall care.     PLAN   *   EGD, colonoscopy?? Brother wants to discuss these with GI.  Pt seems agreeable.      Karl Miller  02/10/2019, 10:10 AM Phone [phone]   Attending physician's note   I have taken an interval history, reviewed the chart and examined the patient. I agree with the Advanced Practitioner's note, impression and recommendations.   Called his brother Sammuel Bailiff) and discussed in detail the possible etiology for iron deficiency anemia due to chronic occult GI blood loss, the potential benefit and risk associated with endoscopic evaluation.  He has history of recurrent CVA, had an episode while on Plavix.  He has high thrombotic risk, hence we will plan to continue Plavix and do EGD and colonoscopy while he is still on Plavix. Discussed with him the risk for potential bleeding, need for additional blood transfusion if that happens.  Will do the bowel prep and plan for EGD and colonoscopy tomorrow The risks and benefits as well as alternatives of endoscopic procedure(s) have been discussed and reviewed. All questions answered. The patient and family agrees to proceed.   Damaris Hippo , MD [phone]

## 2019-02-11 ENCOUNTER — Encounter (HOSPITAL_COMMUNITY): Payer: Self-pay | Admitting: Internal Medicine

## 2019-02-11 ENCOUNTER — Inpatient Hospital Stay (HOSPITAL_COMMUNITY): Payer: Medicare Other | Admitting: Anesthesiology

## 2019-02-11 ENCOUNTER — Encounter (HOSPITAL_COMMUNITY)
Admission: EM | Disposition: A | Discharge status: Skilled Nursing Facility | Payer: Self-pay | Source: Home / Self Care | Attending: Internal Medicine

## 2019-02-11 DIAGNOSIS — K31811 Angiodysplasia of stomach and duodenum with bleeding: Principal | ICD-10-CM

## 2019-02-11 DIAGNOSIS — K635 Polyp of colon: Secondary | ICD-10-CM

## 2019-02-11 HISTORY — PX: COLONOSCOPY WITH PROPOFOL: SHX5780

## 2019-02-11 HISTORY — PX: HEMOSTASIS CONTROL: SHX6838

## 2019-02-11 HISTORY — PX: ESOPHAGOGASTRODUODENOSCOPY (EGD) WITH PROPOFOL: SHX5813

## 2019-02-11 HISTORY — PX: HEMOSTASIS CLIP PLACEMENT: SHX6857

## 2019-02-11 HISTORY — PX: HOT HEMOSTASIS: SHX5433

## 2019-02-11 LAB — CBC
HCT: 37.9 % — ABNORMAL LOW (ref 39.0–52.0)
Hemoglobin: 11.4 g/dL — ABNORMAL LOW (ref 13.0–17.0)
MCH: 26 pg (ref 26.0–34.0)
MCHC: 30.1 g/dL (ref 30.0–36.0)
MCV: 86.3 fL (ref 80.0–100.0)
Platelets: 302 10*3/uL (ref 150–400)
RBC: 4.39 MIL/uL (ref 4.22–5.81)
RDW: 14.7 % (ref 11.5–15.5)
WBC: 7.1 10*3/uL (ref 4.0–10.5)
nRBC: 0 % (ref 0.0–0.2)

## 2019-02-11 SURGERY — ESOPHAGOGASTRODUODENOSCOPY (EGD) WITH PROPOFOL
Anesthesia: Monitor Anesthesia Care

## 2019-02-11 MED ORDER — LISINOPRIL 10 MG PO TABS
10.0000 mg | ORAL_TABLET | Freq: Every day | ORAL | Status: DC
  Administered 2019-02-12 – 2019-02-14 (×3): 10 mg via ORAL
  Filled 2019-02-11 (×4): qty 1

## 2019-02-11 MED ORDER — PROPOFOL 10 MG/ML IV BOLUS
INTRAVENOUS | Status: DC | PRN
  Administered 2019-02-11 (×2): 10 mg via INTRAVENOUS

## 2019-02-11 MED ORDER — PROPOFOL 500 MG/50ML IV EMUL
INTRAVENOUS | Status: DC | PRN
  Administered 2019-02-11: 100 ug/kg/min via INTRAVENOUS

## 2019-02-11 MED ORDER — EPINEPHRINE 1 MG/10ML IJ SOSY
PREFILLED_SYRINGE | INTRAMUSCULAR | Status: AC
  Filled 2019-02-11: qty 10

## 2019-02-11 MED ORDER — LIDOCAINE HCL (CARDIAC) PF 100 MG/5ML IV SOSY
PREFILLED_SYRINGE | INTRAVENOUS | Status: DC | PRN
  Administered 2019-02-11: 50 mg via INTRAVENOUS

## 2019-02-11 MED ORDER — PROMETHAZINE HCL 25 MG/ML IJ SOLN: 6.2500 mg | INTRAMUSCULAR | Status: DC | PRN

## 2019-02-11 MED ORDER — SODIUM CHLORIDE (PF) 0.9 % IJ SOLN
PREFILLED_SYRINGE | INTRAMUSCULAR | Status: DC | PRN
  Administered 2019-02-11: .5 mL
  Administered 2019-02-11: 1 mL

## 2019-02-11 MED ORDER — LACTATED RINGERS IV SOLN
INTRAVENOUS | Status: DC
  Administered 2019-02-11: 13:00:00 via INTRAVENOUS

## 2019-02-11 MED ORDER — SODIUM CHLORIDE 0.9 % IV SOLN
510.0000 mg | Freq: Once | INTRAVENOUS | Status: AC
  Administered 2019-02-12: 09:00:00 510 mg via INTRAVENOUS
  Filled 2019-02-11: qty 17

## 2019-02-11 SURGICAL SUPPLY — 25 items

## 2019-02-11 NOTE — Progress Notes (Signed)
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  This is the very likely source of iron deficiency anemia and heme positive stools.  Colonoscopy without overt lesion but severely limited by poor preparation  I spoke to the patient's brother, Karl Miller, by phone and discussed these results.  I explained that there very well could be more angioectasias in the unexamined small bowel today and even colon.  In the setting of Plavix these will be high risk for oozing, GI blood loss and iron deficiency.  However given his multiple prior strokes Plavix is felt necessary.  After our discussion I think it is reasonable for the following: --Daily PPI --Repeat IV iron, tomorrow will be day 3 after first infusion and thus Feraheme can be redosed --Closely monitor hemoglobin and iron studies after discharge --Supplement iron as needed --If he continues to be iron deficient or certainly if he has overt bleeding, then the decision to stop Plavix will need to be revisited with the patient and his family.

## 2019-02-11 NOTE — Op Note (Signed)
[facility] Patient Name: Karl Miller Procedure Date : 02/11/2019 MRN: [id number] Attending MD: [name] , MD Date of Birth: [birth year] CSN: [id number] Age: 63 Admit Type: Inpatient Procedure:                Colonoscopy Indications:              Heme positive stool, Iron deficiency anemia Providers:                Lajuan Lines. Hilarie Fredrickson, MD, Carlyn Reichert, RN, William Dalton, Technician Referring MD:             [facility] Medicines:                Monitored Anesthesia Care Complications:            No immediate complications. Estimated Blood Loss:     Estimated blood loss: none. Procedure:                Pre-Anesthesia Assessment:                           - Prior to the procedure, a History and Physical                            was performed, and patient medications and                            allergies were reviewed. The patient's tolerance of                            previous anesthesia was also reviewed. The risks                            and benefits of the procedure and the sedation                            options and risks were discussed with the patient.                            All questions were answered, and informed consent                            was obtained. Prior Anticoagulants: The patient has                            taken Plavix (clopidogrel), last dose was 1 day                            prior to procedure. ASA Grade Assessment: III - A                            patient with severe systemic disease. After  reviewing the risks and benefits, the patient was                            deemed in satisfactory condition to undergo the                            procedure.                           After obtaining informed consent, the colonoscope                            was passed under direct vision. Throughout the                            procedure, the patient's  blood pressure, pulse, and                            oxygen saturations were monitored continuously. The                            CF-HQ190L ([id number] Olympus colonoscope was                            introduced through the anus with the intention of                            advancing to the cecum. The scope was advanced to                            the hepatic flexure before the procedure was                            aborted. Medications were given. The colonoscopy                            was performed with difficulty due to poor bowel                            prep and poor endoscopic visualization. The patient                            tolerated the procedure well. The quality of the                            bowel preparation was poor. Scope In: 2:07:56 PM Scope Out: 2:20:18 PM Total Procedure Duration: 0 hours 12 minutes 22 seconds  Findings:      The digital rectal exam was normal.      Extensive amounts of semi-liquid semi-solid stool was found in the       entire colon, interfering with visualization. No evidence of active or       recent bleeding seen today in the colon.      Two sessile polyps were found in the descending colon and transverse  colon. The polyps were small in size. Polypectomy was not attempted due       to inadequate bowel preparation, low-risk nature of these polyps and       recent Plavix use.      Multiple small and large-mouthed diverticula were found in the sigmoid       colon, descending colon and transverse colon.      Retroflexion in the rectum was not performed. Impression:               - Preparation of the colon was poor. Very limited                            views of the examined colon; ascending colon and                            cecum not visualized.                           - Two small polyps in the descending colon and in                            the transverse colon. Resection not attempted.                            - Diverticulosis in the sigmoid colon, in the                            descending colon and in the transverse colon.                           - No specimens collected. Moderate Sedation:      N/A Recommendation:           - Return patient to hospital ward for ongoing care.                           - Resume previous diet.                           - Continue present medications.                           - See the other procedure note for documentation of                            additional recommendations. Procedure Code(s):        --- Professional ---                           [phone], 2, Colonoscopy, flexible; diagnostic,                            including collection of specimen(s) by brushing or                            washing, when performed (separate procedure) Diagnosis Code(s):        ---  Professional ---                           K63.5, Polyp of colon                           R19.5, Other fecal abnormalities                           D50.9, Iron deficiency anemia, unspecified                           K57.30, Diverticulosis of large intestine without                            perforation or abscess without bleeding CPT copyright 2019 [employer]. All rights reserved. The codes documented in this report are preliminary and upon coder review may  be revised to meet current compliance requirements. [name], MD 02/11/2019 3:06:13 PM This report has been signed electronically. Number of Addenda: 0

## 2019-02-11 NOTE — Progress Notes (Signed)
Subjective:  [name] was examined at the bedside this morning after waking up from his sleep. No acute events overnight per chart review. Patient is scheduled for a colonoscopy and EGD today. He confirmed that he has had several bowel movements in preparation for his procedure.   Patient has no complaints this morning. He denies stomach pain, palpitations, chest pain, fatigue and new  weakness.    We will follow-up with patient's brother/guardian following his procedure.   Objective:  Vital signs in last 24 hours: Vitals:   02/10/19 2021 02/11/19 0034 02/11/19 0408 02/11/19 0756  BP: (!) 137/92 (!) 132/95 121/83 (!) 140/101  Pulse: 73 71 79 70  Resp: 18  18 17   Temp: 98.3 F (36.8 C) 98.1 F (36.7 C) 98.4 F (36.9 C) 98.2 F (36.8 C)  TempSrc: Oral Oral Oral Oral  SpO2: 100% 97% 95% 100%  Weight:      Height:       Intake/Output Summary (Last 24 hours) at 02/11/2019 1146 Last data filed at 02/11/2019 0600 Gross per 24 hour  Intake 3480 ml  Output 850 ml  Net 2630 ml   Physical Examination General: Patient is alert and in no apparent distress  HEENT: Normocephalic w/two existing nodules on L. forehead. MMM. PERRL. Pale conjunctivae  CV: Normal rate, regular rhythm. Normal S1, S2. No M/R/G.  Pulmonary: CTAB in upper lung lobes. Normal respiratory expansion.  Abdominal: Soft. NABS. No abdominal tenderness or distension. No rebound or guarding  Neurological: He has normal sensation in all extremities. 5/5 grip strength bilaterally. Extremities: No peripheral edema noted. 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 CBC to be drawn following EGD/colonoscopy. Patient is stable this morning.  Chronic Anemia/GI Bleed:Patient was transfused with 1 unit packed RBC and given 510 mg IV feraheme 7/21. His hemoglobin improved from 9.9 to 11.6 07/22. This morning his hemoglobin was 10.5. Patient still has conjunctival pallor on physical exam. Palliative medicine had a discussion with patient's brother Karl Miller) regarding long term health goals. Patient's brother would like to proceed with GI work-up for presumed GI bleed following conversation with GI regarding risk/benefits. Patient will remain on plavix despite procedure.  - EGD/Colonoscopy today, NPO until procedure will follow GI recs post-procedure  - CBC following GI work-up - continue 40 mg IV pantoprazole Q12h   CVA/Vascular Dementia/Alzheimer's Disease: Patient has a lengthy past medical history of strokes beginning in 2014. Prior documentation from neurologist suggests a component of vascular dementia due to recurrent strokes as well as possible Alzheimer's.Given lack of new MRI, CT findings and patient's limitations as historian regarding reported weakness and gait instability, we are not able to localize a source for the reported admitting symptoms and will continue to monitor for these symptoms during Karl Miller hospital course.Our current theory is that patient's anemia unmasked some deficits from prior strokes which would help explain a transient gait abnormality at his nursing home. - monitor with daily, thourough neurological examination - continue home 75 mg oral plavix QD - continue home 10 mg  oral atorvastatin QD - continue at home 10 mg oral donepezil QD - delirium precautions stillin place  Hypertension -continue to holdpatientshome lisinopril  Hyperlipidemia - continue home med of 10 mg atorvastatin QD @  6pm  BPH:  - continue at home 5 mg finasteride tablet QD   LOS: 3 days   Karl Miller, Marijean Bravo, Medical Student 02/11/2019, 11:46 AM

## 2019-02-11 NOTE — Anesthesia Postprocedure Evaluation (Signed)
Anesthesia Post Note  Patient: Karl Miller  Procedure(s) Performed: ESOPHAGOGASTRODUODENOSCOPY (EGD) WITH PROPOFOL (N/A ) COLONOSCOPY WITH PROPOFOL (N/A ) HOT HEMOSTASIS (ARGON PLASMA COAGULATION/BICAP) (N/A ) HEMOSTASIS CONTROL HEMOSTASIS CLIP PLACEMENT     Patient location during evaluation: PACU Anesthesia Type: MAC Level of consciousness: awake and alert Pain management: pain level controlled Vital Signs Assessment: post-procedure vital signs reviewed and stable Respiratory status: spontaneous breathing, nonlabored ventilation and respiratory function stable Cardiovascular status: blood pressure returned to baseline and stable Postop Assessment: no apparent nausea or vomiting Anesthetic complications: no    Last Vitals:  Vitals:   02/11/19 1433 02/11/19 1444  BP: (!) 134/103 (!) 158/99  Pulse:  (!) 53  Resp: 14 (!) 21  Temp: 37 C   SpO2: 100% 100%    Last Pain:  Vitals:   02/11/19 1444  TempSrc:   PainSc: [place] [facility] Trevino

## 2019-02-11 NOTE — Transfer of Care (Signed)
Immediate Anesthesia Transfer of Care Note  Patient: Karl Miller  Procedure(s) Performed: ESOPHAGOGASTRODUODENOSCOPY (EGD) WITH PROPOFOL (N/A ) COLONOSCOPY WITH PROPOFOL (N/A ) HOT HEMOSTASIS (ARGON PLASMA COAGULATION/BICAP) (N/A ) HEMOSTASIS CONTROL HEMOSTASIS CLIP PLACEMENT  Patient Location: Endoscopy Unit  Anesthesia Type:MAC  Level of Consciousness: sedated  Airway & Oxygen Therapy: Patient Spontanous Breathing and Patient connected to nasal cannula oxygen  Post-op Assessment: Report given to RN, Post -op Vital signs reviewed and stable and Patient moving all extremities  Post vital signs: Reviewed and stable  Last Vitals:  Vitals Value Taken Time  BP 134/103 02/11/19 1433  Temp 37 C 02/11/19 1433  Pulse 59 02/11/19 1438  Resp 13 02/11/19 1438  SpO2 100 % 02/11/19 1438  Vitals shown include unvalidated device data.  Last Pain:  Vitals:   02/11/19 1433  TempSrc: Temporal  PainSc: Asleep         Complications: No apparent anesthesia complications

## 2019-02-11 NOTE — Op Note (Signed)
[facility] Patient Name: Karl Miller Procedure Date : 02/11/2019 MRN: [id number] Attending MD: [name] , MD Date of Birth: 1955/09/07 CSN: [id number] Age: 63 Admit Type: Inpatient Procedure:                Upper GI endoscopy Indications:              Iron deficiency anemia, Heme positive stool Providers:                Lajuan Lines. Hilarie Fredrickson, MD, Carlyn Reichert, RN, William Dalton, Technician Referring MD:             [facility] Medicines:                Monitored Anesthesia Care Complications:            No immediate complications. Estimated Blood Loss:     Estimated blood loss was minimal. Procedure:                Pre-Anesthesia Assessment:                           - Prior to the procedure, a History and Physical                            was performed, and patient medications and                            allergies were reviewed. The patient's tolerance of                            previous anesthesia was also reviewed. The risks                            and benefits of the procedure and the sedation                            options and risks were discussed with the patient.                            All questions were answered, and informed consent                            was obtained. Prior Anticoagulants: The patient has                            taken Plavix (clopidogrel), last dose was 1 day                            prior to procedure. ASA Grade Assessment: III - A                            patient with severe systemic disease. After  reviewing the risks and benefits, the patient was                            deemed in satisfactory condition to undergo the                            procedure.                           After obtaining informed consent, the endoscope was                            passed under direct vision. Throughout the                            procedure, the  patient's blood pressure, pulse, and                            oxygen saturations were monitored continuously. The                            GIF-H190 ([id number] Olympus gastroscope was                            introduced through the mouth, and advanced to the                            second part of duodenum. The upper GI endoscopy was                            accomplished without difficulty. The patient                            tolerated the procedure well. Scope In: Scope Out: Findings:      The examined esophagus was normal.      Four 2 to 5 mm angioectasias, 1 with active bleeding were found in the       gastric body. Area underneath the bleeding lesion was successfully       injected with 1 mL of a 1:10,000 solution of epinephrine for improved       access and control bleeding (by lifting the lesion prior to       destruction). Fulguration to stop the bleeding by argon plasma at 1       liter/minute and 20 watts was successful. To prevent bleeding       post-intervention, 1 hemostatic clip was successfully placed. There was       no bleeding at the end of the maneuver. The second lesion, about 4 mm,       was not injected first and ablated with APC with post-therapy oozing. 2       hemostatic clips were placed with success and cessation of bleeding. The       2 smallest angioectasias, (2-3 mm) were ablation with APC with       epinephrine injection with success.      A single 4 mm angioectasia without bleeding was found in the duodenal  bulb. Area was successfully injected with 1 mL of a 1:10,000 solution of       epinephrine for improved access (by lifting the lesion prior to       destruction). Fulguration to ablate the lesion to prevent bleeding by       argon plasma at 0.5 liters/minute and 20 watts was successful.      Two 1 to 2 mm angioectasias without bleeding were found in the second       portion of the duodenum. Fulguration to ablate the lesion to prevent        bleeding by argon plasma at 0.5 liters/minute and 20 watts was       successful. Impression:               - Normal esophagus.                           - Four angioectasias in the stomach, 1 actively                            bleeding. Injected as above. Treated with argon                            plasma coagulation (APC). Clips were placed as                            above.                           - A single non-bleeding angioectasia in the                            duodenum. Injected. Treated with argon plasma                            coagulation (APC).                           - Two non-bleeding angioectasias in the duodenum.                            Treated with argon plasma coagulation (APC).                           - No specimens collected. Moderate Sedation:      N/A Recommendation:           - Return patient to hospital ward for ongoing care.                           - Advance diet as tolerated.                           - Continue present medications. Decision regarding                            Plavix needs to be made. He will continue to be at  risk for bleeding now and in the future from                            gastric and small intestinal angioectasias. High                            suspicion for additional angioectasias in more                            distal small intestine not seen today. Would give                            2nd dose of Feraheme tomorrow (3 days after initial                            dose). Closely monitor Hgb and iron studies. Daily                            PPI.                           - See colonoscopy report. Procedure Code(s):        --- Professional ---                           [phone], Esophagogastroduodenoscopy, flexible,                            transoral; with control of bleeding, any method Diagnosis Code(s):        --- Professional ---                           O35.009,  Angiodysplasia of stomach and duodenum                            with bleeding                           D50.9, Iron deficiency anemia, unspecified                           R19.5, Other fecal abnormalities CPT copyright 2019 American Medical Association. All rights reserved. The codes documented in this report are preliminary and upon coder review may  be revised to meet current compliance requirements. [name], MD 02/11/2019 2:58:24 PM This report has been signed electronically. Number of Addenda: 0

## 2019-02-11 NOTE — Anesthesia Preprocedure Evaluation (Addendum)
Anesthesia Evaluation  Patient identified by MRN, date of birth, ID band Patient awake and Patient confused    Reviewed: Allergy & Precautions, NPO status , Patient's Chart, lab work & pertinent test results  History of Anesthesia Complications Negative for: history of anesthetic complications  Airway Mallampati: II  TM Distance: >3 FB Neck ROM: Full    Dental no notable dental hx.    Pulmonary Current Smoker,    Pulmonary exam normal        Cardiovascular hypertension, Normal cardiovascular exam     Neuro/Psych Dementia CVA    GI/Hepatic Neg liver ROS, GERD  Medicated and Controlled,GI bleed   Endo/Other  negative endocrine ROS  Renal/GU Renal InsufficiencyRenal disease (Cr 1.4)     Musculoskeletal negative musculoskeletal ROS (+)   Abdominal   Peds  Hematology  (+) anemia , Hgb 10.5   Anesthesia Other Findings Day of surgery medications reviewed with the patient.  Reproductive/Obstetrics                            Anesthesia Physical Anesthesia Plan  ASA: III  Anesthesia Plan: MAC   Post-op Pain Management:    Induction:   PONV Risk Score and Plan: Treatment may vary due to age or medical condition and Propofol infusion  Airway Management Planned: Natural Airway and Nasal Cannula  Additional Equipment:   Intra-op Plan:   Post-operative Plan:   Informed Consent: I have reviewed the patients History and Physical, chart, labs and discussed the procedure including the risks, benefits and alternatives for the proposed anesthesia with the patient or authorized representative who has indicated his/her understanding and acceptance.     Dental advisory given and Consent reviewed with POA  Plan Discussed with: CRNA  Anesthesia Plan Comments: (Consent obtained from patient's brother, [name], via phone. )       Anesthesia Quick Evaluation

## 2019-02-11 NOTE — Interval H&P Note (Signed)
History and Physical Interval Note: For EGD and colonoscopy now to eval IDA in setting of Plavix.  Plavix not interrupted which limits some therapeutic options during these exams.  Dr. Silverio Decamp discussed this with the patient's POA/brother yesterday. HIGHER THAN BASELINE RISK.The nature of the procedure, as well as the risks, benefits, and alternatives were carefully and thoroughly reviewed with the patient. Ample time for discussion and questions allowed. The patient understood, was satisfied, and agreed to proceed.     02/11/2019 1:15 PM  Karl Miller  has presented today for surgery, with the diagnosis of anemia, black fobt + stool.  The various methods of treatment have been discussed with the patient and family. After consideration of risks, benefits and other options for treatment, the patient has consented to  Procedure(s): ESOPHAGOGASTRODUODENOSCOPY (EGD) WITH PROPOFOL (N/A) COLONOSCOPY WITH PROPOFOL (N/A) as a surgical intervention.  The patient's history has been reviewed, patient examined, no change in status, stable for surgery.  I have reviewed the patient's chart and labs.  Questions were answered to the patient's satisfaction.     Lajuan Lines Jashan Cotten

## 2019-02-12 LAB — CBC
HCT: 34.5 % — ABNORMAL LOW (ref 39.0–52.0)
Hemoglobin: 10.4 g/dL — ABNORMAL LOW (ref 13.0–17.0)
MCH: 25.7 pg — ABNORMAL LOW (ref 26.0–34.0)
MCHC: 30.1 g/dL (ref 30.0–36.0)
MCV: 85.2 fL (ref 80.0–100.0)
Platelets: 284 10*3/uL (ref 150–400)
RBC: 4.05 MIL/uL — ABNORMAL LOW (ref 4.22–5.81)
RDW: 14.6 % (ref 11.5–15.5)
WBC: 6.8 10*3/uL (ref 4.0–10.5)
nRBC: 0 % (ref 0.0–0.2)

## 2019-02-12 NOTE — Progress Notes (Signed)
   Subjective: Patient is resting comfortably in bed this morning drinking coffee.  He has no concerns.  IV iron is running.  Objective:  Vital signs in last 24 hours: Vitals:   02/12/19 0408 02/12/19 0818 02/12/19 1307 02/12/19 1809  BP: (!) 150/93  (!) 138/94 (!) 167/114  Pulse: (!) 53 (!) 114 63 85  Resp: 18 16 18 19   Temp: 97.8 F (36.6 C) 98.6 F (37 C) 98.8 F (37.1 C)   TempSrc: Oral Oral Oral   SpO2: 100% 95% 100% 100%  Weight:      Height:       Physical Exam Constitutional:      General: He is not in acute distress. Cardiovascular:     Rate and Rhythm: Normal rate and regular rhythm.     Heart sounds: No murmur.  Pulmonary:     Effort: Pulmonary effort is normal.     Breath sounds: Normal breath sounds.  Abdominal:     General: Bowel sounds are normal.     Palpations: Abdomen is soft.  Skin:    General: Skin is warm and dry.  Neurological:     Mental Status: He is alert.      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 Lata is a 63 year old male with past medical history significant vascular dementia, Alzheimer's disease, CVA, hypertension, hyperlipidemia, and chronic anemia who underwent EGD/colonoscopy on 7/24 for chronic anemia.  Patient found to have approximately 7 angioid ectasias in his stomach and duodenum all treated with  argon plasma cauterization.  Patient has a lengthy past medical history of strokes beginning in 2014. Prior documentation from neurologist suggests a component of vascular dementia due to recurrent strokes as well as possible Alzheimer's.Given lack of new MRI, CT findings and patient's limitations as historian regarding reported  weakness and gait instability, we are not able to localize a source for the reported admitting symptoms and will continue to monitor for these symptoms during Mr. [facility] hospital course.Our current theory is that patient's anemia unmasked some deficits from prior strokes which would help explain a transient gait abnormality at his nursing home.  #Chronic anemia/GI bleed Since admission patient has received 1 unit RBC and now 2 doses of Feraheme (7/21 and 7/25).  Hemoglobin down trended from 11.4 to 10.4 today.  Patient has remained on Plavix due to history of multiple CVAs.  If patient hemoglobin continues to drop will follow GIs recommendation of repeating colonoscopy and performing a video capsule endoscopy -Appreciate gastroenterologist recommendations -40 mg IV pantoprazole every 12 hours   #CVA/Vascular Dementia/Alzheimer's Disease: Patient at his normal baseline today on exam.  Will continue medication regimen as below.  - continue home 75 mg oral plavix QD - continue home 10 mg oral atorvastatin QD - continue at home 10 mg oral donepezil QD - delirium precautions stillin place  #Hypertension -continue toholdpatientshome lisinopril  #Hyperlipidemia - continue home med of 10 mg atorvastatin QD @ 6pm  #BPH:  - continue at home 5 mg finasteride tablet QD  Dispo: Anticipated discharge in approximately 3-5 day(s).   VTE Prophx: Plavix Diet: Regular Code:Full  Tamsen Snider, MD PGY1  [phone]

## 2019-02-12 NOTE — NC FL2 (Addendum)
[place] LEVEL OF CARE SCREENING TOOL     IDENTIFICATION  Patient Name: Karl Miller Birthdate: 1955/08/09 Sex: male Admission Date (Current Location): 02/08/2019  [place] and [place] Number:  Kathleen Argue [id number] [place] and Address:  [facility], [place] [address] [place],  [address]      Provider Number: [id number]  Attending Physician Name and Address:  Axel Filler, *  Relative Name and Phone Number:  Allyn, Bertoni, [place] Roxine Caddy, Mother, [phone]    Current Level of Care: Hospital Recommended Level of Care: Memory Care Prior Approval Number:    Date Approved/Denied:   PASRR Number: [id number]  Discharge Plan: Memory Care    Current Diagnoses: Patient Active Problem List   Diagnosis Date Noted  . Gastric and duodenal angiodysplasia with hemorrhage   . Heme positive stool   . Anemia   . Vascular dementia without behavioral disturbance ([place])   . Advanced care planning/counseling discussion   . Goals of care, counseling/discussion   . 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[place])   . Palliative care by specialist   . GI bleed 02/08/2019  . CVA (cerebral vascular accident) ([place]) 11/15/2018  . Benign essential HTN   . Tobacco abuse   . TIA (transient ischemic attack)   . Diastolic dysfunction   . Prediabetes   . Acute blood loss anemia   . Stage 3 chronic kidney disease ([place])   . Hyperlipidemia   . Syphilis   . Cerebral embolism with cerebral infarction 12/05/2017  . Smoker   . Acute encephalopathy 12/04/2017  . HTN (hypertension) 12/04/2017  . AKI (acute kidney injury) ([place]) 12/04/2017  . Elevated PSA 12/04/2017  . Rhabdomyolysis 12/04/2017    Orientation RESPIRATION BLADDER Height & Weight     Self  Normal Incontinent, External catheter Weight: 170 lb (77.1 kg) Height:  5\' 9"  (175.3 cm)  BEHAVIORAL SYMPTOMS/MOOD NEUROLOGICAL BOWEL NUTRITION STATUS      Incontinent Diet(Regular,  needs assistance setting up)  AMBULATORY STATUS COMMUNICATION OF NEEDS Skin     Verbally Normal(Dry skin)                       Personal Care Assistance Level of Assistance  Feeding   Feeding assistance: Limited assistance(Needs assistance setting up)       Functional Limitations Info  Sight, Hearing, Speech Sight Info: Adequate Hearing Info: Adequate Speech Info: Adequate    SPECIAL CARE FACTORS FREQUENCY                       Contractures Contractures Info: Not present    Additional Factors Info  Code Status, Allergies Code Status Info: Full Code Allergies Info: No Known Allergies           Current Medications (02/12/2019):  This is the current hospital active medication list Current Facility-Administered Medications  Medication Dose Route Frequency Provider Last Rate Last Dose  . acetaminophen (TYLENOL) tablet 650 mg  650 mg Oral Q6H PRN Neva Seat, MD       Or  . acetaminophen (TYLENOL) suppository 650 mg  650 mg Rectal Q6H PRN Neva Seat, MD      . atorvastatin (LIPITOR) tablet 10 mg  10 mg Oral q1800 Neva Seat, MD   10 mg at 02/11/19 1718  . clopidogrel (PLAVIX) tablet 75 mg  75 mg Oral Daily Neva Seat, MD   75 mg at 02/12/19 0834  . donepezil (ARICEPT) tablet 10 mg  10 mg Oral Daily Neva Seat, MD   10 mg at 02/12/19 0834  . finasteride (PROSCAR) tablet 5 mg  5 mg Oral Daily Neva Seat, MD   5 mg at 02/12/19 0834  . lisinopril (ZESTRIL) tablet 10 mg  10 mg Oral Daily Gilles Chiquito B, MD   10 mg at 02/12/19 0835  . pantoprazole (PROTONIX) EC tablet 40 mg  40 mg Oral Q0600 Vena Rua, PA-C   40 mg at 02/12/19 1594  . ramelteon (ROZEREM) tablet 8 mg  8 mg Oral Once Maudie Mercury, MD   Stopped at 02/10/19 0240  . sodium chloride flush (NS) 0.9 % injection 3 mL  3 mL Intravenous Q12H Neva Seat, MD   3 mL at 02/12/19 0835     Discharge Medications: STOP taking these medications   ferrous  sulfate 325 (65 FE) MG tablet     TAKE these medications   acetaminophen 500 MG tablet Commonly known as: TYLENOL Take 500-1,000 mg by mouth every 6 (six) hours as needed for mild pain.   atorvastatin 10 MG tablet Commonly known as: LIPITOR Take 1 tablet (10 mg total) by mouth daily at 6 PM.   clopidogrel 75 MG tablet Commonly known as: PLAVIX Take 75 mg by mouth daily.   donepezil 10 MG tablet Commonly known as: ARICEPT Take 10 mg by mouth every morning.   finasteride 5 MG tablet Commonly known as: PROSCAR Take 5 mg by mouth daily.   guaiFENesin 100 MG/5ML Soln Commonly known as: ROBITUSSIN Take 10 mLs by mouth every 4 (four) hours as needed for cough or to loosen phlegm.   lisinopril 10 MG tablet Commonly known as: ZESTRIL Take 1 tablet (10 mg total) by mouth daily. Start taking on: February 15, 2019   loperamide 2 MG capsule Commonly known as: IMODIUM Take 2 mg by mouth as needed for diarrhea or loose stools (not to exceed 8 doses in 24hrs).   magnesium hydroxide 400 MG/5ML suspension Commonly known as: MILK OF MAGNESIA Take 30 mLs by mouth at bedtime as needed for mild constipation.   neomycin-bacitracin-polymyxin Oint Commonly known as: NEOSPORIN Apply 1 application topically as needed for wound care.   pantoprazole 40 MG tablet Commonly known as: PROTONIX Take 40 mg by mouth daily.     Relevant Imaging Results:  Relevant Lab Results:   Additional Information SSN: [id number]  Philippa Chester Pinion, LCSWA

## 2019-02-12 NOTE — TOC Progression Note (Signed)
Transition of Care [facility]) - Progression Note    Patient Details  Name: Rider Ermis MRN: [id number] Date of Birth: [birth year]  Transition of Care [facility]) CM/SW [place], [place] Phone Number: 02/12/2019, 2:13 PM  Clinical Narrative:     CSW received a consult to refer the patient out to an outpatient palliative facility. CSW called and spoke with [name], the patient's brother and POA. He stated that he would like his brother to have [facility] follow him once he returns back to [facility]. CSW provided contact information if he had any more questions or concerns.   CSW called Bevely Palmer with [facility] to make the outpatient palliative referral. Audrea Muscat said that they would follow up with [name] and start the process.   CSW will continue to follow.   Expected Discharge Plan: Assisted Living Barriers to Discharge: Continued Medical Work up  Expected Discharge Plan and Services Expected Discharge Plan: Assisted Living     Post Acute Care Choice: NA Living arrangements for the past 2 months: Assisted Living Facility                                       Social Determinants of Health (SDOH) Interventions    Readmission Risk Interventions No flowsheet data found.

## 2019-02-12 NOTE — Progress Notes (Signed)
     [facility] Progress Note  CC:  Anemia   Subjective: He denies having any abdominal pain. No BM overnight or this am as confirmed by his RN. He is tolerating a regular diet. Receiving 2nd dose of Feraheme at this time.    Objective:  S/P EGD 7/24: identified 4 AVMs in the stomach, 1 actively bleeding injected with epi, clips and APC. A single non-bleeding angioectasia in the duodenum treated with APC and two non-bleeding angioectasias in the duodenum. Treated with APC.  S/P colonoscopy 7/24: Preparation of the colon was poor. Very limited views of the examined colon; ascending colon and cecum not visualized. Two small polyps in the descending colon and in the transverse colon. Resection not attempted. Diverticulosis in the sigmoid colon, in the descending colon and in the transvers  Vital signs in last 24 hours: Temp:  [97.6 F (36.4 C)-98.7 F (37.1 C)] 98.6 F (37 C) (07/25 0818) Pulse Rate:  [50-114] 114 (07/25 0818) Resp:  [14-21] 16 (07/25 0818) BP: (134-165)/(90-103) 150/93 (07/25 0408) SpO2:  [95 %-100 %] 95 % (07/25 0818) Weight:  [77.1 kg] 77.1 kg (07/24 1227) Last BM Date: 02/11/19 General:   Alert, well developed, in NAD. Heart: RRR, no murmurs.  Pulm:  Lungs slightly coarse but clear throughout.  Abdomen: Soft, nontender, + BS x 4 quads. Extremities:  Without edema. Neurologic:  Alert and  oriented x x2, He knows he is at [place] all extremities.  Psych:  Alert and cooperative. Normal mood and affect.  Intake/Output from previous day: 07/24 0701 - 07/25 0700 In: 600 [I.V.:600] Out: 500 [Urine:500] Intake/Output this shift: No intake/output data recorded.  Lab Results: Recent Labs    02/10/19 0712 02/11/19 1630 02/12/19 0349  WBC 6.3 7.1 6.8  HGB 10.5* 11.4* 10.4*  HCT 34.4* 37.9* 34.5*  PLT 298 302 284   BMET Recent Labs    02/10/19 0712  NA 139  K 4.3  CL 108  CO2 24  GLUCOSE 87  BUN 19  CREATININE 1.40*   CALCIUM 9.8    Assessment / Plan:  1. 63 y.o. male with anemia, FOBT+. S/P EGD 7/24 identified 4 AVMs in the stomach, 1 actively bleeding injected with epi, clips and APC. A single non-bleeding angioectasia in the duodenum treated with APC and two non-bleeding angioectasias in the duodenum also treated with APC. S/P colonoscopy 7/24 resulted in a poor prep. Ascending colon and cecum not visualized. Two small polyps in the descending colon and in the transverse colon. Resection not attempted. Diverticulosis. Hg 10.4 down from 11.4. No obvious signs of active GI bleeding.  -regular diet. -2nd dose of Feraheme infusing now -continue Protonix 40mg  po QD -will need repeat CBC and iron studies as outpatient -? When to restart Plavix -repeat CBC in am  2. CKD stabe 3  3. Hx multiple CVAs, Plavix currently on hold  4. C spine epidural tumor  Further recommendations per Dr. Hilarie Fredrickson   LOS: 4 days   Noralyn Pick  02/12/2019, 9:18 AM

## 2019-02-12 NOTE — TOC Initial Note (Signed)
Transition of Care [facility]) - Initial/Assessment Note    Patient Details  Name: Karl Miller MRN: [id number] Date of Birth: [birth year]  Transition of Care [facility]) CM/SW Contact:    Candie Chroman, LCSW Phone Number: 02/12/2019, 9:59 AM  Clinical Narrative: CSW met with patient, introduced role, and explained that discharge planning would be discussed. Patient confirmed he is a resident at [facility] and plans to return there at discharge. He has lived there for 6 years. No further concerns. CSW encouraged patient to contact CSW as needed. CSW will continue to follow patient for support and facilitate return to ALF once medically stable.                 Expected Discharge Plan: Assisted Living Barriers to Discharge: Continued Medical Work up   Patient Goals and CMS Choice     Choice offered to / list presented to : NA  Expected Discharge Plan and Services Expected Discharge Plan: Assisted Living     Post Acute Care Choice: NA Living arrangements for the past 2 months: [place]                                      Prior Living Arrangements/Services Living arrangements for the past 2 months: [place] Lives with:: Facility Resident Patient language and need for interpreter reviewed:: Yes Do you feel safe going back to the place where you live?: Yes      Need for Family Participation in Patient Care: Yes (Comment) Care giver support system in place?: Yes (comment)   Criminal Activity/Legal Involvement Pertinent to Current Situation/Hospitalization: No - Comment as needed  Activities of Daily Living      Permission Sought/Granted Permission sought to share information with : Facility Art therapist granted to share information with : Yes, Verbal Permission Granted     Permission granted to share info w AGENCY: [facility]        Emotional Assessment Appearance:: Appears stated  age Attitude/Demeanor/Rapport: Engaged, Gracious Affect (typically observed): Accepting, Appropriate, Calm, Pleasant Orientation: : Oriented to Self, Oriented to Place, Oriented to  Time, Oriented to Situation Alcohol / Substance Use: Never Used Psych Involvement: No (comment)  Admission diagnosis:  Gastrointestinal hemorrhage, unspecified gastrointestinal hemorrhage type [K92.2] Anemia, unspecified type [D64.9] Patient Active Problem List   Diagnosis Date Noted  . Gastric and duodenal angiodysplasia with hemorrhage   . Heme positive stool   . Anemia   . Vascular dementia without behavioral disturbance ([place])   . Advanced care planning/counseling discussion   . Goals of care, counseling/discussion   . 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[place])   . Palliative care by specialist   . GI bleed 02/08/2019  . CVA (cerebral vascular accident) ([place]) 11/15/2018  . Benign essential HTN   . Tobacco abuse   . TIA (transient ischemic attack)   . Diastolic dysfunction   . Prediabetes   . Acute blood loss anemia   . Stage 3 chronic kidney disease ([place])   . Hyperlipidemia   . Syphilis   . Cerebral embolism with cerebral infarction 12/05/2017  . Smoker   . Acute encephalopathy 12/04/2017  . HTN (hypertension) 12/04/2017  . AKI (acute kidney injury) ([place]) 12/04/2017  . Elevated PSA 12/04/2017  . Rhabdomyolysis 12/04/2017   PCP:  Administration, [facility] Pharm[facility]:   [id number] [address]  [place] [address] Phone: [phone] Fax: [phone]  [place], [place] - [place] [phone] [address] [place] [address] Phone: [phone] Fax: [phone]     Social Determinants of Health (SDOH) Interventions    Readmission Risk Interventions No flowsheet data found.

## 2019-02-13 ENCOUNTER — Encounter (HOSPITAL_COMMUNITY): Payer: Self-pay | Admitting: Internal Medicine

## 2019-02-13 DIAGNOSIS — K5521 Angiodysplasia of colon with hemorrhage: Secondary | ICD-10-CM

## 2019-02-13 DIAGNOSIS — Z79899 Other long term (current) drug therapy: Secondary | ICD-10-CM

## 2019-02-13 DIAGNOSIS — E785 Hyperlipidemia, unspecified: Secondary | ICD-10-CM

## 2019-02-13 DIAGNOSIS — Z9889 Other specified postprocedural states: Secondary | ICD-10-CM

## 2019-02-13 DIAGNOSIS — Z8673 Personal history of transient ischemic attack (TIA), and cerebral infarction without residual deficits: Secondary | ICD-10-CM

## 2019-02-13 DIAGNOSIS — F05 Delirium due to known physiological condition: Secondary | ICD-10-CM

## 2019-02-13 DIAGNOSIS — N400 Enlarged prostate without lower urinary tract symptoms: Secondary | ICD-10-CM

## 2019-02-13 DIAGNOSIS — Z7902 Long term (current) use of antithrombotics/antiplatelets: Secondary | ICD-10-CM

## 2019-02-13 DIAGNOSIS — I10 Essential (primary) hypertension: Secondary | ICD-10-CM

## 2019-02-13 LAB — BASIC METABOLIC PANEL
Anion gap: 8 (ref 5–15)
BUN: 19 mg/dL (ref 8–23)
CO2: 24 mmol/L (ref 22–32)
Calcium: 9.7 mg/dL (ref 8.9–10.3)
Chloride: 109 mmol/L (ref 98–111)
Creatinine, Ser: 1.41 mg/dL — ABNORMAL HIGH (ref 0.61–1.24)
GFR calc Af Amer: >60 mL/min (ref >60)
GFR calc non Af Amer: 52 mL/min — ABNORMAL LOW (ref >60)
Glucose, Bld: 84 mg/dL (ref 70–99)
Potassium: 4 mmol/L (ref 3.5–5.1)
Sodium: 141 mmol/L (ref 135–145)

## 2019-02-13 LAB — CBC
HCT: 34.2 % — ABNORMAL LOW (ref 39.0–52.0)
Hemoglobin: 10.4 g/dL — ABNORMAL LOW (ref 13.0–17.0)
MCH: 26.2 pg (ref 26.0–34.0)
MCHC: 30.4 g/dL (ref 30.0–36.0)
MCV: 86.1 fL (ref 80.0–100.0)
Platelets: 294 10*3/uL (ref 150–400)
RBC: 3.97 MIL/uL — ABNORMAL LOW (ref 4.22–5.81)
RDW: 14.6 % (ref 11.5–15.5)
WBC: 5.9 10*3/uL (ref 4.0–10.5)
nRBC: 0 % (ref 0.0–0.2)

## 2019-02-13 LAB — SARS CORONAVIRUS 2 BY RT PCR (HOSPITAL ORDER, PERFORMED IN ~~LOC~~ HOSPITAL LAB): SARS Coronavirus 2: NEGATIVE

## 2019-02-13 NOTE — Progress Notes (Signed)
    Progress Note   Subjective  Feels well Tolerating diet No BMs today No abd pain, nausea or vomiting   Objective  Vital signs in last 24 hours: Temp:  [97.8 F (36.6 C)-98.8 F (37.1 C)] 98.4 F (36.9 C) (07/26 0834) Pulse Rate:  [52-85] 84 (07/26 0834) Resp:  [15-19] 15 (07/26 0319) BP: (123-170)/(89-114) 123/89 (07/26 0834) SpO2:  [100 %] 100 % (07/26 0834) Last BM Date: 02/11/19  Gen: awake, alert, NAD HEENT: anicteric, op clear CV: RRR, no mrg Pulm: CTA b/l Abd: soft, NT/ND, +BS throughout Ext: no c/c/e, scds in place Neuro: nonfocal  Intake/Output from previous day: 07/25 0701 - 07/26 0700 In: -  Out: 500 [Urine:500] Intake/Output this shift: Total I/O In: 356 [P.O.:356] Out: -   Lab Results: Recent Labs    02/11/19 1630 02/12/19 0349 02/13/19 0429  WBC 7.1 6.8 5.9  HGB 11.4* 10.4* 10.4*  HCT 37.9* 34.5* 34.2*  PLT 302 284 294   BMET Recent Labs    02/13/19 0429  NA 141  K 4.0  CL 109  CO2 24  GLUCOSE 84  BUN 19  CREATININE 1.41*  CALCIUM 9.7      Assessment & Recommendations  63 year old male with iron deficiency anemia found to have angioectasias of the stomach and duodenum, history of multiple prior strokes on Plavix, CKD and a cervical spine epidural tumor  1.  Iron deficiency anemia due to GI bleeding in the setting of stomach and small bowel angioectasias --status post upper endoscopy and colonoscopy, the latter limited by poor preparation.  Endoscopic therapy for angioectasias of the stomach and small bowel.  Hemodynamically stable and now hemoglobin is stable --Stable hemoglobin is a good sign, no plans for further endoscopic intervention at present --He has received IV iron --We will continue Plavix given his history of multiple strokes; in the event of recurrent overt GI bleeding or declining hemoglobin we will need to consider further endoscopic evaluation but also whether Plavix should be stopped --Daily PPI -- Monitor Hgb  and in about 1 month repeat iron studies (repeat IV PRN)  2.  History of CVA on Plavix --Plavix continues uninterrupted, see above  GI will sign off for now, call with questions      LOS: 5 days   [name]  02/13/2019, 12:00 PM

## 2019-02-13 NOTE — Progress Notes (Signed)
Subjective:  Karl Miller was examined at the bedside this morning while he was watching television. Patient was alert and oriented x 3 today. Delirium precautions still in place.   He feels much better today. He denied fatigue, palpitations, abdominal pain, dizziness or weakness. His only complaint is that he feels cold. He was resting with 1 sheet and 1 blanket. We discussed his hemoglobin values. Educated patient to take note of his bowel movement color moving forward.   Patient plans to call his brother [name]) today and is okay if we call him as well.   Objective:  Vital signs in last 24 hours: Vitals:   02/12/19 2038 02/13/19 0000 02/13/19 0101 02/13/19 0319  BP: (!) 149/92 (!) 162/93 (!) 151/96 (!) 150/100  Pulse:  78 (!) 52 (!) 52  Resp:  16 18 15   Temp:  98.1 F (36.7 C)  98.1 F (36.7 C)  TempSrc:  Oral  Oral  SpO2:  100%  100%  Weight:      Height:        Intake/Output Summary (Last 24 hours) at 02/13/2019 0956 Last data filed at 02/13/2019 0700 Gross per 24 hour  Intake --  Output 500 ml  Net -500 ml   Physical Examination General:Patient is alert and oriented x 3. In no apparent distress  HEENT: Normocephalic w/two existingnodules on L.forehead. MMM. PERRL. Conjunctival pallor. No mucosal lesions/ telangiectasias     CV: Tachycardic, regular rhythm. Normal S1, S2. No M/R/G.  Pulmonary: CTAB in upper lung lobes. Normal respiratory expansion.  Abdominal: Soft. NABS. No abdominal tenderness or distension. No rebound or guarding  Neurological: He has normal sensation in all extremities. 5/5 grip strength bilaterally. Extremities: No peripheral edema noted. Cold fingers. Warm LE.  Assessment/Plan:  Active Problems:   GI bleed   Anemia   Vascular dementia without behavioral disturbance (HCC)   Advanced care planning/counseling discussion   Goals of care, counseling/discussion   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[place])   Palliative care by specialist   Heme positive  stool   Gastric and duodenal angiodysplasia with hemorrhage  Karl Miller is a 63 year old man with a PMHx significant for vascular dementia, possible alzheimer's disease, CVA, HTN, HLD, and recurring anemia who is currently receiving treatment for iron deficiency anemia due to multiple AVMs in small intestine. Patient is stable and is being monitored with CBC and vitals. Discharge probable.    Chronic Anemia/GI Bleed:Patient was transfused with 1 unit packed RBCand given 510 mg IV feraheme 7/21. His hemoglobin improved from9.9 to 11.6 07/22. This morning his hemoglobin was 10.5. Patient still has conjunctival pallor on physical exam.Patients EGD/Colonoscopy demonstrated multiple AVMS in small bowel that were all treated. Patient will remain on plavix despite procedure.  - monitor with daily CBC - continue 40 mg IV pantoprazole Q12h - start home 325 mg ferrous sulfate tomorrow   CVA/Vascular Dementia/Alzheimer's Disease: Patient has a lengthy past medical history of strokes beginning in 2014. Prior documentation from neurologist suggests a component of vascular dementia due to recurrent strokes as well as possible Alzheimer's.Given lack of new MRI, CT findings and patient's limitations as historian regarding reported weakness and gait instability, we are not able to localize a source for the reported admitting symptoms and will continue to monitor for these symptoms during Mr. [facility] hospital course.Our current theory is that patient's anemia unmasked some deficits from prior strokes which would help explain a transient gait abnormality at his nursing home which lead to his arrival to  [facility]. - monitor with daily, thourough neurological examination - continue home 75 mg oral plavix QD - continue home 10 mg oral atorvastatin QD - continue at home 10 mg oral donepezil QD - delirium precautions stillin place  Hypertension -continuepatientshome 10 mg oral  lisinoprildaily  Hyperlipidemia - continue home med of 10 mg atorvastatin QD @ 6pm  BPH:  - continue at home 5 mg finasteride tablet QD   LOS: 5 days   Juanna Cao, Medical Student 02/13/2019, 9:56 AM

## 2019-02-13 NOTE — Discharge Summary (Addendum)
Name: Karl Miller MRN: [id number] DOB: 1956-05-29 63 y.o. PCP: [facility]  Date of Admission: 02/08/2019  8:02 AM Date of Discharge: 02/14/19 Attending Physician: Oda Kilts, MD  Discharge Diagnosis: 1. Anemia due to GI bleed   Discharge Medications: Allergies as of 02/14/2019   No Known Allergies      Medication List     STOP taking these medications    ferrous sulfate 325 (65 FE) MG tablet       TAKE these medications    acetaminophen 500 MG tablet Commonly known as: TYLENOL Take 500-1,000 mg by mouth every 6 (six) hours as needed for mild pain.   atorvastatin 10 MG tablet Commonly known as: LIPITOR Take 1 tablet (10 mg total) by mouth daily at 6 PM.   clopidogrel 75 MG tablet Commonly known as: PLAVIX Take 75 mg by mouth daily.   donepezil 10 MG tablet Commonly known as: ARICEPT Take 10 mg by mouth every morning.   finasteride 5 MG tablet Commonly known as: PROSCAR Take 5 mg by mouth daily.   guaiFENesin 100 MG/5ML Soln Commonly known as: ROBITUSSIN Take 10 mLs by mouth every 4 (four) hours as needed for cough or to loosen phlegm.   lisinopril 10 MG tablet Commonly known as: ZESTRIL Take 1 tablet (10 mg total) by mouth daily. Start taking on: February 15, 2019   loperamide 2 MG capsule Commonly known as: IMODIUM Take 2 mg by mouth as needed for diarrhea or loose stools (not to exceed 8 doses in 24hrs).   magnesium hydroxide 400 MG/5ML suspension Commonly known as: MILK OF MAGNESIA Take 30 mLs by mouth at bedtime as needed for mild constipation.   neomycin-bacitracin-polymyxin Oint Commonly known as: NEOSPORIN Apply 1 application topically as needed for wound care.   pantoprazole 40 MG tablet Commonly known as: PROTONIX Take 40 mg by mouth daily.        Disposition and follow-up:   Mr.Karl Miller was discharged from [facility] in stable condition.  At the hospital follow up visit please  address:  1.  Hemoglobin levels   2.  Symptoms of GI bleed: changes in bowel movement color, dizziness, weakness, gait instability   3.  Labs / imaging needed at time of follow-up: CBC, Iron studies in ~ a month  3.  Pending labs/ test needing follow-up: N/A   Follow-up Appointments:     Hospital Course by problem list:  1. Symptomatic anemia due to GI bleed:  Patient arrived to [facility] 07/21 following gait abnormality reported by nursing staff of  [facility]. Nursing staff also mentioned left sided weakness assessed via grip strength examination. Upon arrival, patient underwent stroke evaluation with head CT and brain MRI, which demonstrated chronic abnormalities (right corona radiata, occipital, basal ganglia, lacunar infarcts) but no new or acute findings. Hgb was 6.9, FOBT positive. He received 2 units of packed RBCs and Hgb responded to 9.9 the following day (07/22). GI team consulted.   Due to patient's PMHx of vascular dementia, possible alzheimer's disease and plavix use due to multiple CVA palliative medicine was consulted to discuss long term health goals with patient's brother/power of attorney [name]). POA consented to GI work-up (07/23). Patient received first dose 510 mg IV feraheme (07/23). Colonoscopy (07/24) noted two small polyps in the descending colon and in the transverse colon and diverticulosis in the sigmoid colon, in the descending colon and in the transverse colon. EGD (07/24) revealed four angioectasias in the stomach which were  all treated with epinephrine, argon laser, and clips. (07/25) patient was given second dose of 510 mg IV feraheme. Last three CBCs demonstrated HgB of 11.4, 10.4, & 10.4. Patient had no episodes of L. Sided weakness or gait instability while in the hospital. His symptoms likely represented recrudescence of his stroke due to acute blood loss anemia.  He should have follow up testing of his CBC, iron, and ferritin.   Discharge Vitals:   BP (!)  149/104 (BP Location: Left Arm)    Pulse 68    Temp (!) 96.8 F (36 C) (Axillary)    Resp 20    Ht 5\' 9"  (1.753 m)    Wt 77.1 kg    SpO2 99%    BMI 25.10 kg/m   Pertinent Labs, Studies, and Procedures:  CBC (07/21): WBC 4.0 - 10.5 K/uL 3.5Low    RBC 4.22 - 5.81 MIL/uL 2.69Low    Hemoglobin 13.0 - 17.0 g/dL 6.9Low Panic    HCT 39.0 - 52.0 % 24.3Low    MCV 80.0 - 100.0 fL 90.3   MCH 26.0 - 34.0 pg 25.7Low    MCHC 30.0 - 36.0 g/dL 28.4Low    RDW 11.5 - 15.5 % 13.9   Platelets 150 - 400 K/uL 309   nRBC 0.0 - 0.2 % 0.0    CT Head W/O Contrast Findings (07/21):  Extensive chronic small vessel disease and numerous old lacunar infarcts. Old right occipital infarct. No acute intracranial abnormality.  MRI Brain W/O Contrast impressions 07/21: 1. Evolving late subacute/early chronic right corona radiata lacunar infarct without significant change in location from the previous study. 2. No new or acute intracranial abnormalities. 3. Multiple other remote lacunar infarcts of the basal ganglia and corona radiata bilaterally are stable. 4. Age advanced atrophy and white matter disease likely reflects the sequela of chronic microvascular ischemia. 5. Remote infarcts involving the posterior right occipital lobe and the inferior left cerebellum are stable.  Iron Studies (07/22): Ferritin 24 - 336 ng/mL 13 Low     Iron 45 - 182 ug/dL 41Low    TIBC 250 - 450 ug/dL 375   Saturation Ratios 17.9 - 39.5 % 11 Low    UIBC ug/dL 334    Retic Ct Pct 0.4 - 3.1 % 2.4   RBC. 4.22 - 5.81 MIL/uL 3.78 Low    Retic Count, Absolute 19.0 - 186.0 K/uL 90.0   Immature Retic Fract 2.3 - 15.9 % 25.0 High     EGD Impressions (07/24):  - Normal esophagus. - Four angioectasias in the stomach, 1 actively bleeding. Injected as above. Treated with argon plasma coagulation (APC). Clips were placed as above. - A single non-bleeding angioectasia in the duodenum. Injected. Treated with argon plasma coagulation (APC). - Two  non-bleeding angioectasias in the duodenum. Treated with argon plasma coagulation (APC). - No specimens collected.  Colonoscopy Impressions (07/24): Preparation of the colon was poor. Very limited views of the examined colon; ascending colon and cecum not visualized. - Two small polyps in the descending colon and in the transverse colon. Resection not attempted. - Diverticulosis in the sigmoid colon, in the descending colon and in the transverse colon. - No specimens collected.  Discharge Instructions:   Discharge Instructions     Diet - low sodium heart healthy   Complete by: As directed    Discharge instructions   Complete by: As directed    Mr.Karl Miller,  It was our pleasure to take part in your care.You came in with signs  you were losing blood and this was seen on labs. The GI doctors found blood vessels in your small bowel bleeding and they were cauterized. Please keep a eye on your bowel movements each day and note any color changes to nursing home staff. In addition   1. Patient will be followed by outpatient palliative medicine facility [facility], waiting for [facility] to make the referral 2. Follow-up with your primary care physician (PCP) regarding: iron studies in 1 month, discontinuing iron supplementation, continuation of Plavix   Increase activity slowly   Complete by: As directed        Signed:   Tamsen Snider, MD PGY1  [phone]

## 2019-02-13 NOTE — Progress Notes (Signed)
CSW received a phone call from internal medicine inquiring if the patient would be able to discharge back to the facility today.   CSW called and spoke with the RN, [place]. She stated that they do not have the charge nurse at the facility today to accept him back. She stated they can take him on Monday. Jasmine also stated that the patient will require another COVID screen before returning.   CSW informed internal medicine. CSW will attempt to discharge the patient on Monday.   Domenic Schwab, MSW, [place] Worker [facility]  [phone]

## 2019-02-14 DIAGNOSIS — D62 Acute posthemorrhagic anemia: Secondary | ICD-10-CM

## 2019-02-14 DIAGNOSIS — K5730 Diverticulosis of large intestine without perforation or abscess without bleeding: Secondary | ICD-10-CM

## 2019-02-14 DIAGNOSIS — G309 Alzheimer's disease, unspecified: Secondary | ICD-10-CM

## 2019-02-14 DIAGNOSIS — F0280 Dementia in other diseases classified elsewhere without behavioral disturbance: Secondary | ICD-10-CM

## 2019-02-14 MED ORDER — LISINOPRIL 10 MG PO TABS: 10.0000 mg | ORAL_TABLET | Freq: Every day | ORAL | 0 refills | Status: DC

## 2019-02-14 NOTE — Progress Notes (Signed)
Subjective:  Patient was examined at the bedside this morning while he was watching television. Alert and oriented to person, place, year.  Patient does not have any questions, concerns or complaints. He denies abdominal pain, weakness, or feeling cold. He was not able to call his brother yesterday but will attempt to speak with him today. He feels comfortable being discharged today.    Patient was reminded to take note of color of his bowel movements.   Discharge dependent on [facility] staff. SARS COV-2 screen negative. Will work with social work to have patient discharged today.   Objective:  Vital signs in last 24 hours: Vitals:   02/13/19 0834 02/13/19 2002 02/13/19 2328 02/14/19 0405  BP: 123/89 130/83 (!) 145/94 (!) 153/88  Pulse: 84  74 62  Resp:  15 15 15   Temp: 98.4 F (36.9 C) 99.5 F (37.5 C) 97.9 F (36.6 C) 98.4 F (36.9 C)  TempSrc: Oral Oral Oral Oral  SpO2: 100%  100% 100%  Weight:      Height:        Intake/Output Summary (Last 24 hours) at 02/14/2019 0755 Last data filed at 02/14/2019 0630 Gross per 24 hour  Intake 1016 ml  Output 250 ml  Net 766 ml   Physical Examination General:Patientis alert and oriented x 3. In no apparent distress HEENT: Normocephalic w/two existingnodules on L.forehead. MMM. PERRL. Conjunctival pallor. No mucosal lesions/ telangiectasias     CV: Tachycardic, regular rhythm. Normal S1, S2. No M/R/G.  Pulmonary: CTABin upper lung lobes. Normal respiratory expansion.  Abdominal: Soft. NABS. No abdominal tenderness or distension. No rebound or guarding  Neurological: He has normal sensation in all extremities. 5/5grip strength bilaterally. Extremities: No peripheral edema noted.Fingers still cold but warmer than 07/26. Warm LE.   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   Chronic Anemia/GI Bleed:Patient was transfused with 2 unit packed RBC 07/21and given 510 mg IV feraheme 7/22. His hemoglobin improved from9.9 to 11.6 07/22. 07/25, patient's hemoglobin was 10.4. Patient still has slight conjunctival pallor on physical exam.Patients EGD/Colonoscopy demonstrated multiple AVMS in small bowel that were all treated. Patient will remain on plavix for now will be addressed at follow up.  -continue40 mg IV pantoprazole Q12h - hold home 325 mg ferrous sulfate upon discharge, will address at follow up   CVA/Vascular Dementia/Alzheimer's Disease: Patient has a lengthy past medical history of strokes beginning in 2014. Prior documentation from neurologist suggests a component of vascular dementia due to recurrent strokes as well as possible Alzheimer's.Given lack of new MRI, CT findings and patient's limitations as historian regarding reported weakness and gait instability, we are not able to localize a source for the reported admitting symptoms and will continue to monitor for these symptoms during Mr. [facility] hospital course.Our current theory is that patient's anemia unmasked some deficits from prior strokes which would help explain a transient gait abnormality at his  nursing home which lead to his arrival to [facility]. He has had no deficits throughout his hospitalization. - monitor with daily, thourough neurological examination - continue home 75 mg oral plavix QD - continue home 10 mg oral  atorvastatin QD - continue at home 10 mg oral donepezil QD - delirium precautionsstillin place  Hypertension -continuepatientshome 10 mg oral lisinoprildaily  Hyperlipidemia - continue home med of 10 mg atorvastatin QD @ 6pm  BPH:  - continue at home 5 mg finasteride tablet QD   LOS: 6 days   BrinsonMarijean Bravo, Medical Student 02/14/2019, 7:55 AM

## 2019-02-14 NOTE — Care Management Important Message (Signed)
Important Message  Patient Details  Name: Karl Miller MRN: [id number] Date of Birth: [birth year]   Medicare Important Message Given:  Yes     Orbie Pyo 02/14/2019, 2:44 PM

## 2019-02-14 NOTE — TOC Transition Note (Signed)
Transition of Care [facility]) - CM/SW Discharge Note   Patient Details  Name: Karl Miller MRN: [id number] Date of Birth: 1955-09-16  [facility] CM/SW Contact:  [name], LCSW Phone Number: 02/14/2019, 4:04 PM   Clinical Narrative:   Nurse to call report to [phone]    Final next level of care: Memory Care Barriers to Discharge: Barriers Resolved   Patient Goals and CMS Choice     Choice offered to / list presented to : NA  Discharge Placement              Patient chooses bed at: [facility]) Patient to be transferred to facility by: [facility] Name of family member notified: Brother Patient and family notified of of transfer: 02/14/19  Discharge Plan and Services     Post Acute Care Choice: NA                               Social Determinants of Health (SDOH) Interventions     Readmission Risk Interventions No flowsheet data found.

## 2019-03-08 ENCOUNTER — Other Ambulatory Visit: Payer: Self-pay | Admitting: Internal Medicine

## 2019-06-02 ENCOUNTER — Telehealth: Payer: Self-pay

## 2019-06-02 NOTE — Telephone Encounter (Signed)
Error

## 2019-07-30 ENCOUNTER — Inpatient Hospital Stay (HOSPITAL_COMMUNITY)
Admission: EM | Admit: 2019-07-30 | Discharge: 2019-08-08 | DRG: 377 | Disposition: A | Discharge status: Skilled Nursing Facility | Payer: Medicare Other | Attending: Internal Medicine | Admitting: Internal Medicine

## 2019-07-30 ENCOUNTER — Emergency Department (HOSPITAL_COMMUNITY): Payer: Medicare Other

## 2019-07-30 DIAGNOSIS — K5521 Angiodysplasia of colon with hemorrhage: Secondary | ICD-10-CM

## 2019-07-30 DIAGNOSIS — Z833 Family history of diabetes mellitus: Secondary | ICD-10-CM

## 2019-07-30 DIAGNOSIS — K31811 Angiodysplasia of stomach and duodenum with bleeding: Secondary | ICD-10-CM | POA: Diagnosis present

## 2019-07-30 DIAGNOSIS — F17210 Nicotine dependence, cigarettes, uncomplicated: Secondary | ICD-10-CM | POA: Diagnosis present

## 2019-07-30 DIAGNOSIS — D631 Anemia in chronic kidney disease: Secondary | ICD-10-CM | POA: Diagnosis present

## 2019-07-30 DIAGNOSIS — I129 Hypertensive chronic kidney disease with stage 1 through stage 4 chronic kidney disease, or unspecified chronic kidney disease: Secondary | ICD-10-CM | POA: Diagnosis present

## 2019-07-30 DIAGNOSIS — K922 Gastrointestinal hemorrhage, unspecified: Secondary | ICD-10-CM | POA: Diagnosis not present

## 2019-07-30 DIAGNOSIS — K31819 Angiodysplasia of stomach and duodenum without bleeding: Secondary | ICD-10-CM | POA: Diagnosis not present

## 2019-07-30 DIAGNOSIS — N183 Chronic kidney disease, stage 3 unspecified: Secondary | ICD-10-CM | POA: Diagnosis present

## 2019-07-30 DIAGNOSIS — R64 Cachexia: Secondary | ICD-10-CM | POA: Diagnosis present

## 2019-07-30 DIAGNOSIS — Z8673 Personal history of transient ischemic attack (TIA), and cerebral infarction without residual deficits: Secondary | ICD-10-CM

## 2019-07-30 DIAGNOSIS — U071 COVID-19: Secondary | ICD-10-CM

## 2019-07-30 DIAGNOSIS — E871 Hypo-osmolality and hyponatremia: Secondary | ICD-10-CM | POA: Diagnosis not present

## 2019-07-30 DIAGNOSIS — J1282 Pneumonia due to coronavirus disease 2019: Secondary | ICD-10-CM | POA: Diagnosis present

## 2019-07-30 DIAGNOSIS — Z79899 Other long term (current) drug therapy: Secondary | ICD-10-CM

## 2019-07-30 DIAGNOSIS — F0150 Vascular dementia without behavioral disturbance: Secondary | ICD-10-CM | POA: Diagnosis present

## 2019-07-30 DIAGNOSIS — E785 Hyperlipidemia, unspecified: Secondary | ICD-10-CM | POA: Diagnosis present

## 2019-07-30 DIAGNOSIS — G309 Alzheimer's disease, unspecified: Secondary | ICD-10-CM | POA: Diagnosis present

## 2019-07-30 DIAGNOSIS — Z9889 Other specified postprocedural states: Secondary | ICD-10-CM | POA: Diagnosis not present

## 2019-07-30 DIAGNOSIS — D62 Acute posthemorrhagic anemia: Secondary | ICD-10-CM

## 2019-07-30 DIAGNOSIS — N179 Acute kidney failure, unspecified: Secondary | ICD-10-CM | POA: Diagnosis present

## 2019-07-30 DIAGNOSIS — Z7902 Long term (current) use of antithrombotics/antiplatelets: Secondary | ICD-10-CM | POA: Diagnosis not present

## 2019-07-30 DIAGNOSIS — K219 Gastro-esophageal reflux disease without esophagitis: Secondary | ICD-10-CM | POA: Diagnosis present

## 2019-07-30 DIAGNOSIS — Z6822 Body mass index (BMI) 22.0-22.9, adult: Secondary | ICD-10-CM | POA: Diagnosis not present

## 2019-07-30 DIAGNOSIS — F0280 Dementia in other diseases classified elsewhere without behavioral disturbance: Secondary | ICD-10-CM | POA: Diagnosis present

## 2019-07-30 DIAGNOSIS — E870 Hyperosmolality and hypernatremia: Secondary | ICD-10-CM | POA: Diagnosis present

## 2019-07-30 DIAGNOSIS — D500 Iron deficiency anemia secondary to blood loss (chronic): Secondary | ICD-10-CM | POA: Diagnosis not present

## 2019-07-30 DIAGNOSIS — I248 Other forms of acute ischemic heart disease: Secondary | ICD-10-CM | POA: Diagnosis present

## 2019-07-30 DIAGNOSIS — A539 Syphilis, unspecified: Secondary | ICD-10-CM | POA: Diagnosis present

## 2019-07-30 DIAGNOSIS — D649 Anemia, unspecified: Secondary | ICD-10-CM | POA: Diagnosis not present

## 2019-07-30 DIAGNOSIS — K5520 Angiodysplasia of colon without hemorrhage: Secondary | ICD-10-CM | POA: Diagnosis not present

## 2019-07-30 DIAGNOSIS — D509 Iron deficiency anemia, unspecified: Secondary | ICD-10-CM | POA: Diagnosis not present

## 2019-07-30 DIAGNOSIS — I10 Essential (primary) hypertension: Secondary | ICD-10-CM | POA: Diagnosis not present

## 2019-07-30 DIAGNOSIS — G20 Parkinson's disease: Secondary | ICD-10-CM | POA: Diagnosis not present

## 2019-07-30 LAB — LACTATE DEHYDROGENASE: LDH: 321 U/L — ABNORMAL HIGH (ref 98–192)

## 2019-07-30 LAB — CBC WITH DIFFERENTIAL/PLATELET
Abs Immature Granulocytes: 0.05 10*3/uL (ref 0.00–0.07)
Abs Immature Granulocytes: 0 10*3/uL (ref 0.00–0.07)
Band Neutrophils: 0 %
Basophils Absolute: 0 10*3/uL (ref 0.0–0.1)
Basophils Absolute: 0 10*3/uL (ref 0.0–0.1)
Basophils Relative: 0 %
Basophils Relative: 0 %
Blasts: 0 %
Eosinophils Absolute: 0 10*3/uL (ref 0.0–0.5)
Eosinophils Absolute: 0 10*3/uL (ref 0.0–0.5)
Eosinophils Relative: 0 %
Eosinophils Relative: 0 %
HCT: 23.1 % — ABNORMAL LOW (ref 39.0–52.0)
HCT: 24.2 % — ABNORMAL LOW (ref 39.0–52.0)
Hemoglobin: 5.8 g/dL — CL (ref 13.0–17.0)
Hemoglobin: 6.1 g/dL — CL (ref 13.0–17.0)
Immature Granulocytes: 1 %
Lymphocytes Relative: 9 %
Lymphocytes Relative: 8 %
Lymphs Abs: 0.5 10*3/uL — ABNORMAL LOW (ref 0.7–4.0)
Lymphs Abs: 0.4 10*3/uL — ABNORMAL LOW (ref 0.7–4.0)
MCH: 18.4 pg — ABNORMAL LOW (ref 26.0–34.0)
MCH: 18.7 pg — ABNORMAL LOW (ref 26.0–34.0)
MCHC: 25.1 g/dL — ABNORMAL LOW (ref 30.0–36.0)
MCHC: 25.2 g/dL — ABNORMAL LOW (ref 30.0–36.0)
MCV: 73.3 fL — ABNORMAL LOW (ref 80.0–100.0)
MCV: 74 fL — ABNORMAL LOW (ref 80.0–100.0)
Metamyelocytes Relative: 0 %
Monocytes Absolute: 0.3 10*3/uL (ref 0.1–1.0)
Monocytes Absolute: 0.3 10*3/uL (ref 0.1–1.0)
Monocytes Relative: 5 %
Monocytes Relative: 5 %
Myelocytes: 0 %
Neutro Abs: 4.8 10*3/uL (ref 1.7–7.7)
Neutro Abs: 4.5 10*3/uL (ref 1.7–7.7)
Neutrophils Relative %: 85 %
Neutrophils Relative %: 87 %
Other: 0 %
Platelets: 191 10*3/uL (ref 150–400)
Platelets: 192 10*3/uL (ref 150–400)
Promyelocytes Relative: 0 %
RBC: 3.15 MIL/uL — ABNORMAL LOW (ref 4.22–5.81)
RBC: 3.27 MIL/uL — ABNORMAL LOW (ref 4.22–5.81)
RDW: 25.4 % — ABNORMAL HIGH (ref 11.5–15.5)
RDW: 25.5 % — ABNORMAL HIGH (ref 11.5–15.5)
WBC: 5.6 10*3/uL (ref 4.0–10.5)
WBC: 5.2 10*3/uL (ref 4.0–10.5)
nRBC: 0.7 % — ABNORMAL HIGH (ref 0.0–0.2)
nRBC: 0 /100 WBC
nRBC: 0.4 % — ABNORMAL HIGH (ref 0.0–0.2)

## 2019-07-30 LAB — LACTIC ACID, PLASMA
Lactic Acid, Venous: 1.3 mmol/L (ref 0.5–1.9)
Lactic Acid, Venous: 1.2 mmol/L (ref 0.5–1.9)

## 2019-07-30 LAB — BASIC METABOLIC PANEL
Anion gap: 11 (ref 5–15)
BUN: 44 mg/dL — ABNORMAL HIGH (ref 8–23)
CO2: 25 mmol/L (ref 22–32)
Calcium: 9.9 mg/dL (ref 8.9–10.3)
Chloride: 114 mmol/L — ABNORMAL HIGH (ref 98–111)
Creatinine, Ser: 1.9 mg/dL — ABNORMAL HIGH (ref 0.61–1.24)
GFR calc Af Amer: 42 mL/min — ABNORMAL LOW (ref >60)
GFR calc non Af Amer: 36 mL/min — ABNORMAL LOW (ref >60)
Glucose, Bld: 140 mg/dL — ABNORMAL HIGH (ref 70–99)
Potassium: 4.1 mmol/L (ref 3.5–5.1)
Sodium: 150 mmol/L — ABNORMAL HIGH (ref 135–145)

## 2019-07-30 LAB — TROPONIN I (HIGH SENSITIVITY)
Troponin I (High Sensitivity): 24 ng/L — ABNORMAL HIGH (ref <18)
Troponin I (High Sensitivity): 22 ng/L — ABNORMAL HIGH (ref <18)

## 2019-07-30 LAB — RESPIRATORY PANEL BY RT PCR (FLU A&B, COVID)
Influenza A by PCR: NEGATIVE
Influenza B by PCR: NEGATIVE
SARS Coronavirus 2 by RT PCR: POSITIVE — AB

## 2019-07-30 LAB — PREPARE RBC (CROSSMATCH)

## 2019-07-30 LAB — C-REACTIVE PROTEIN: CRP: 9.7 mg/dL — ABNORMAL HIGH (ref <1.0)

## 2019-07-30 LAB — D-DIMER, QUANTITATIVE: D-Dimer, Quant: 4.21 ug/mL-FEU — ABNORMAL HIGH (ref 0.00–0.50)

## 2019-07-30 LAB — IRON AND TIBC
Iron: 12 ug/dL — ABNORMAL LOW (ref 45–182)
Saturation Ratios: 4 % — ABNORMAL LOW (ref 17.9–39.5)
TIBC: 339 ug/dL (ref 250–450)
UIBC: 327 ug/dL

## 2019-07-30 LAB — POC SARS CORONAVIRUS 2 AG -  ED: SARS Coronavirus 2 Ag: POSITIVE — AB

## 2019-07-30 LAB — POC OCCULT BLOOD, ED: Fecal Occult Bld: POSITIVE — AB

## 2019-07-30 LAB — TRIGLYCERIDES: Triglycerides: 160 mg/dL — ABNORMAL HIGH (ref <150)

## 2019-07-30 LAB — PROCALCITONIN: Procalcitonin: 0.21 ng/mL

## 2019-07-30 LAB — FERRITIN: Ferritin: 40 ng/mL (ref 24–336)

## 2019-07-30 LAB — FIBRINOGEN: Fibrinogen: 609 mg/dL — ABNORMAL HIGH (ref 210–475)

## 2019-07-30 MED ORDER — FINASTERIDE 5 MG PO TABS
5.0000 mg | ORAL_TABLET | Freq: Every day | ORAL | Status: DC
  Administered 2019-07-31 – 2019-08-08 (×8): 5 mg via ORAL
  Filled 2019-07-30 (×8): qty 1

## 2019-07-30 MED ORDER — DONEPEZIL HCL 10 MG PO TABS
10.0000 mg | ORAL_TABLET | ORAL | Status: DC
  Administered 2019-08-01 – 2019-08-08 (×7): 10 mg via ORAL
  Filled 2019-07-30 (×9): qty 1

## 2019-07-30 MED ORDER — SODIUM CHLORIDE 0.9 % IV SOLN
100.0000 mg | Freq: Every day | INTRAVENOUS | Status: AC
  Administered 2019-07-31 – 2019-08-03 (×4): 100 mg via INTRAVENOUS
  Filled 2019-07-30 (×3): qty 100
  Filled 2019-07-30: qty 20

## 2019-07-30 MED ORDER — GUAIFENESIN 100 MG/5ML PO SOLN
10.0000 mL | ORAL | Status: DC | PRN
  Filled 2019-07-30: qty 10

## 2019-07-30 MED ORDER — PANTOPRAZOLE SODIUM 40 MG IV SOLR: 40.0000 mg | Freq: Two times a day (BID) | INTRAVENOUS | Status: DC

## 2019-07-30 MED ORDER — ACETAMINOPHEN 650 MG RE SUPP
650.0000 mg | Freq: Three times a day (TID) | RECTAL | Status: DC | PRN
  Administered 2019-07-31: 650 mg via RECTAL
  Filled 2019-07-30: qty 1

## 2019-07-30 MED ORDER — ATORVASTATIN CALCIUM 40 MG PO TABS
40.0000 mg | ORAL_TABLET | Freq: Every day | ORAL | Status: DC
  Administered 2019-07-31 – 2019-08-08 (×8): 40 mg via ORAL
  Filled 2019-07-30 (×8): qty 1

## 2019-07-30 MED ORDER — PANTOPRAZOLE SODIUM 40 MG IV SOLR
40.0000 mg | Freq: Once | INTRAVENOUS | Status: AC
  Administered 2019-07-30: 15:00:00 40 mg via INTRAVENOUS
  Filled 2019-07-30: qty 40

## 2019-07-30 MED ORDER — SODIUM CHLORIDE 0.9 % IV SOLN
10.0000 mL/h | Freq: Once | INTRAVENOUS | Status: AC
  Administered 2019-07-30: 10 mL/h via INTRAVENOUS

## 2019-07-30 MED ORDER — BACITRACIN-NEOMYCIN-POLYMYXIN OINTMENT TUBE
1.0000 "application " | TOPICAL_OINTMENT | CUTANEOUS | Status: DC | PRN
  Filled 2019-07-30: qty 14

## 2019-07-30 MED ORDER — PANTOPRAZOLE SODIUM 40 MG IV SOLR
40.0000 mg | Freq: Two times a day (BID) | INTRAVENOUS | Status: DC
  Administered 2019-07-31 – 2019-08-02 (×6): 40 mg via INTRAVENOUS
  Filled 2019-07-30 (×6): qty 40

## 2019-07-30 MED ORDER — SODIUM CHLORIDE 0.9 % IV SOLN
510.0000 mg | Freq: Once | INTRAVENOUS | Status: AC
  Administered 2019-07-31: 01:00:00 510 mg via INTRAVENOUS
  Filled 2019-07-30: qty 17

## 2019-07-30 MED ORDER — SODIUM CHLORIDE 0.9 % IV SOLN
200.0000 mg | Freq: Once | INTRAVENOUS | Status: AC
  Administered 2019-07-30: 200 mg via INTRAVENOUS
  Filled 2019-07-30: qty 40

## 2019-07-30 MED ORDER — DEXTROSE 5 % IV SOLN: INTRAVENOUS | Status: AC

## 2019-07-30 NOTE — ED Notes (Signed)
[employer] transport manager by [employer]

## 2019-07-30 NOTE — ED Notes (Signed)
Reached out to [facility] per MD request. Spoke with nursing staff- reports pt tested COVID positive on Jan 7 , reports pt had a fever today and they were concerned. Also states pt not as "Active"MD made aware.

## 2019-07-30 NOTE — ED Notes (Signed)
Paged MD for tempeture

## 2019-07-30 NOTE — ED Notes (Signed)
Pt has hx dementia. Reached out to patient brother Chin Cermeno Sr. Identified as patients legal guardian. He gives verbal consent for patient to receive blood transfusion.

## 2019-07-30 NOTE — ED Triage Notes (Signed)
Pt to ED via EMS from [facility], COVID positive c/o chest pain that started at 0845 this morning. Hx of dementia, orientation at baseline. # 20 Left forearm. EMS attempted to give aspirin, but pt kept them in his mouth without swallowing. Last VS 138/78, 104 pulse, RR 18, 100 % RA Temp100. CBG 219.

## 2019-07-30 NOTE — H&P (Signed)
Date: 07/30/2019               Patient Name:  Karl Miller MRN: [id number]  DOB: [birth year] Age / Sex: 64 y.o., male   PCP: [facility]         Medical Service: Internal Medicine Teaching Service         Attending Physician: Dr. Aldine Contes, MD    First Contact: Dr. Ladona Horns Pager: [phone]  Second Contact: Dr. Modena Nunnery Pager: [phone]       After Hours (After 5p/  First Contact Pager: [phone]  weekends / holidays): Second Contact Pager: [phone]   Chief Complaint: chest pain  History of Present Illness:  Karl Miller is a 64 year old M with a significant PMH of vascular dementia, Alzheimer's disease, CVA, hypertension, hyperlipidemia, and chronic anemia who presented to the emergency room today from [facility] for chest pain. History obtained from nursing staff at [facility], pt's brother, Karl Miller, and chart review due to pt's dementia history. Pt began having low grade fevers (last 99.6) at home 5-7 days ago. Since that time, staff noted him to have decreased PO intake and generalized weakness and malaise. Nursing staff reports that he was requiring total care from staff. Pt tested positive for COVID-19 on 07/28/2019. His living facility has multiple staff and entire floors of residents who are COVID positive.   This morning, pt reported to nursing that he was having chest pain and appeared uncomfortable when moving around in bed. Vital signs were stable per nursing report, though pt's pulse ox had been fluctuating between 92-97. Nursing denies episodes of tachycardia or hypotension. Pt has been taking plavix 75mg  daily. No nursing reports of bleeding, bruising, trauma, melena, or hematochezia. Pt denies chest pain, shortness of breath, nausea/vomiting, or abdominal pain when asked in the ED.   Pt's baseline mental status is described as pleasant with frequent laughing outbursts and occasional visual hallucinations. Pt's brother reports that he speaks  to the pt once a week, and pt was noted to have a good week prior to this acute illness where he was more coherent and somewhat conversant.   In the ED, pt was afebrile (99.1), non-tachycardic with HR 95, BP 121/88, and O2 saturation 99% on RA. His lab work was significant for Hgb 5.8 (prior baseline 10.4), WBC 5.6, and platelets 191. BMP remarkable for Na 150, K 4.1, bicarb 25, BUN 44, Cr 1.90, and Glc 140. Troponin mildly elevated to 24. Lactic acid unremarkable at 1.3. EKG stable. CXR revealed developing pneumonia in the R base. Internal medicine was called for admission for pt's acute on chronic anemia.  Meds:  Current Meds  Medication Sig  . acetaminophen (TYLENOL) 500 MG tablet Take 500-1,000 mg by mouth every 8 (eight) hours as needed for mild pain.   Marland Kitchen alum & mag hydroxide-simeth (MYLANTA) 200-200-20 MG/5ML suspension Take 30 mLs by mouth as needed for indigestion or heartburn.  Marland Kitchen atorvastatin (LIPITOR) 10 MG tablet Take 1 tablet (10 mg total) by mouth daily at 6 PM. (Patient taking differently: Take 40 mg by mouth daily. )  . clopidogrel (PLAVIX) 75 MG tablet Take 75 mg by mouth daily.  Marland Kitchen donepezil (ARICEPT) 10 MG tablet Take 10 mg by mouth every morning.  . ferrous sulfate 325 (65 FE) MG tablet Take 325 mg by mouth 3 (three) times a week. Mon, Wed, Fri  . finasteride (PROSCAR) 5 MG tablet Take 5 mg by mouth daily.  Marland Kitchen guaiFENesin (ROBITUSSIN)  100 MG/5ML SOLN Take 10 mLs by mouth every 4 (four) hours as needed for cough or to loosen phlegm.  Marland Kitchen lisinopril (ZESTRIL) 10 MG tablet Take 1 tablet (10 mg total) by mouth daily.  Marland Kitchen loperamide (IMODIUM) 2 MG capsule Take 2 mg by mouth as needed for diarrhea or loose stools (not to exceed 8 doses in 24hrs).  . magnesium hydroxide (MILK OF MAGNESIA) 400 MG/5ML suspension Take 30 mLs by mouth at bedtime as needed for mild constipation.   Marland Kitchen neomycin-bacitracin-polymyxin (NEOSPORIN) OINT Apply 1 application topically as needed for wound care.  .  pantoprazole (PROTONIX) 40 MG tablet Take 40 mg by mouth daily.  . Vitamin D, Ergocalciferol, (DRISDOL) 1.25 MG (50000 UT) CAPS capsule Take 50,000 Units by mouth every 7 (seven) days. Wednesday   Allergies: Allergies as of 07/30/2019  . (No Known Allergies)   Past Medical History:  Diagnosis Date  . Acute encephalopathy 12/03/2017   [name] 12/04/2017  . High cholesterol   . Hypertension   . Stroke ([place])   . TIA (transient ischemic attack) 12/03/2017   [name] 12/04/2017   Family History:  Diabetes in brother, sister, and mother. Breast cancer in sister. Leukemia in father.  Social History:  Pt coming from [facility] living facility.  Social History   Tobacco Use  . Smoking status: Current Every Day Smoker    Packs/day: 0.50    Years: 24.00    Pack years: 12.00    Types: Cigarettes  . Smokeless tobacco: Never Used  Substance Use Topics  . Alcohol use: No  . Drug use: No   Review of Systems: ROS limited by pt's mental status.  Review of Systems  Constitutional: Positive for fever and malaise/fatigue.  Respiratory: Negative for shortness of breath.   Cardiovascular: Positive for chest pain.  Gastrointestinal: Negative for abdominal pain, blood in stool and melena.   Physical Exam: Blood pressure 137/88, pulse 84, temperature 99.1 F (37.3 C), temperature source Oral, resp. rate (!) 21, SpO2 98 %. Physical Exam Vitals and nursing note reviewed.  Constitutional:      General: He is not in acute distress.    Appearance: He is cachectic. He is not ill-appearing.  HENT:     Head: Normocephalic and atraumatic.  Eyes:     Comments: Conjunctival pallor.   Cardiovascular:     Rate and Rhythm: Normal rate and regular rhythm.     Heart sounds: Normal heart sounds.  Pulmonary:     Effort: Pulmonary effort is normal.     Breath sounds: Normal breath sounds.     Comments: On room air.  Abdominal:     General: Abdomen is flat. Bowel sounds are normal.     Palpations:  Abdomen is soft.  Musculoskeletal:        General: No swelling or deformity.     Right lower leg: No edema.     Left lower leg: No edema.  Skin:    General: Skin is warm and dry.  Neurological:     General: No focal deficit present.     Mental Status: He is alert and oriented to person, place, and time.  Psychiatric:        Mood and Affect: Mood normal.     Comments: Speech limited to short responses/yes/no.    EKG: personally reviewed my interpretation is normal rate, sinus rhythm, no ST segment changes   CXR: personally reviewed my interpretation is no cardiomegaly, pulmonary vascular congestion, or pleural effusions. Airspace opacity  on R lung base. Loop recorder present on the L.   Assessment & Plan by Problem: Active Problems:   Acute on chronic blood loss anemia  Mr. Leff is a 64 year old M with a significant PMH of vascular dementia, Alzheimer's disease, CVA, hypertension, hyperlipidemia, and chronic anemia who presented for chest pain in the setting of a COVID-19 infection and was found to have a Hgb of 5.8.   Acute on chronic blood loss anemia - likely secondary to recurrent GI bleed on clopidogrel Fe deficiency anemia Pt presented due to chest pain and subsequent lab work revealed a Hgb of 5.8. FOBT positive. Pt without any recent episodes of bleeding, melena, or hematochezia. Hemodynamically stable at this time. Of note, he was admitted in July of 2020 because of anemia secondary to a GI bleed. Work-up at that time with EGD and colonoscopy revealed angioectasias in the stomach and small bowel treated with epinephrine, laser, and clips in addition to diverticulosis. GI at that time opted to continue clopidogrel give history of multiple stroke, though stated that in the event of a recurrent GI bleeding pt would need endoscopic evaluation in addition to discussion around whether the clopidogrel should be stopped.  - acute drop in chronic anemia likely secondary to recurrent GI  bleed on plavix - transfuse 2 units pRBCs - follow-up post-transfusion H&H for transfusion goal of 7 - pantoprazole 40mg  IV BID - holding pt's home clopidogrel  - pt instructed to have follow-up in August 2020 for repeat blood work for possible outpatient IV iron transfusions, does not appear this was complete with no results available in the chart  - iron studies indicative of iron deficiency anemia with ferritin 40, iron 12, and TIBC 339  - will give IV iron  GI consulted, appreciate their input   Chest pain Pt reported chest pain to staff this morning. Upon arrival to the ED, he denied any further chest pain. Vital signs remained stable. Troponin flat 22 << 24. EKG stable, without ischemic changes. ACS unlikely. Favor chest pain secondary to anemia as above and subsequent demand. Could also be burning or epigastric pain associated with upper GI bleeding. - will continue to monitor  COVID-19 - mild Pt with symptom onset on 07/23/2019 and positive test on 07/28/2019. His living facility has many positive cases between residents at staff. Symptoms thus far are low-grade fevers, malaise, and decreased PO intake. No episodes of hypoxia. O2 saturations between 92-97% per facility, though pt maintaining >99% on room air in the ED. - continuous pulse ox - starting remdesivir - d-dimer, CRP, and ferritin daily  AKI Cr on admission 1.9, with prior baseline of 1.4 in July 2020. Pt having decreased PO intake for past 5-7 days due to COVID. This correlates with his hypernatremia of 150 on admission. Calculated free water deficit of 3L. - D5W @ 174mL/hr - continue to monitor on morning BMP  HTN Hyperlipidemia Multiple prior CVAs Pt's residual deficits appear mostly cognitive with possible word finding difficulty. Nursing staff at facility endorses he has been taking clopidogrel daily.  - holding home clopidogrel 75mg  daily and lisinopril 10mg  daily - continue atorvastatin 40mg  daily - will need to  readdress risk/benefit of continuing clopidogrel at discharge   Diet - soft Fluids - 159mL/hr of D5W Isolation precautions - airborne and contact DVT ppx - SCDs CODE STATUS - FULL CODE  Cindie Laroche, pt's brother, is his guardian. Contact information [phone]  Dispo: Admit patient to Inpatient with expected length of  stay greater than 2 midnights. When pt is medically stable, he will be able to discharge back [facility] despite COVID status.  Signed: Ladona Horns, MD 07/30/2019, 2:16 PM  Pager: [phone]

## 2019-07-30 NOTE — ED Notes (Signed)
Date and time results received: 07/30/19  Test: POC COVID Critical Value: Positive  Name of Provider Notified: Koleen Distance

## 2019-07-30 NOTE — ED Notes (Signed)
Verified with pt's brother and legal guardian, Ulice Bold, that he gives verbal consent for pt to receive blood transfusion

## 2019-07-30 NOTE — ED Notes (Signed)
Spoke with blood bank , reports will be delay in receving units of blood.

## 2019-07-30 NOTE — ED Provider Notes (Addendum)
[facility] EMERGENCY DEPARTMENT Provider Note   CSN: [id number] Arrival date & time: 07/30/19  [id number]     History Chief Complaint  Patient presents with  . Chest Pain    COVID positive     Karl Miller is a 64 y.o. male.  Presents to ER with reported complaint of chest pain.  Patient tested positive yesterday with Covid per facility.  Had fever today and sent to ER for further eval.  At time of my interview patient denies any complaints.  Additional history obtained from patient's son, no other concerns.  History limited due to patient's dementia.  When asked, he denies any chest pain, shortness of breath, abdominal pain, vomiting, blood in stools.  HPI     Past Medical History:  Diagnosis Date  . Acute encephalopathy 12/03/2017   [name] 12/04/2017  . High cholesterol   . Hypertension   . Stroke ([place])   . TIA (transient ischemic attack) 12/03/2017   [name] 12/04/2017    Patient Active Problem List   Diagnosis Date Noted  . Gastric and duodenal angiodysplasia with hemorrhage   . Heme positive stool   . Anemia   . Vascular dementia without behavioral disturbance ([place])   . Advanced care planning/counseling discussion   . Goals of care, counseling/discussion   . 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[place])   . Palliative care by specialist   . GI bleed 02/08/2019  . CVA (cerebral vascular accident) ([place]) 11/15/2018  . Benign essential HTN   . Tobacco abuse   . TIA (transient ischemic attack)   . Diastolic dysfunction   . Prediabetes   . Acute blood loss anemia   . Stage 3 chronic kidney disease   . Hyperlipidemia   . Syphilis   . Cerebral embolism with cerebral infarction 12/05/2017  . Smoker   . Acute encephalopathy 12/04/2017  . HTN (hypertension) 12/04/2017  . AKI (acute kidney injury) ([place]) 12/04/2017  . Elevated PSA 12/04/2017  . Rhabdomyolysis 12/04/2017    Past Surgical History:  Procedure Laterality Date  . COLONOSCOPY WITH PROPOFOL N/A 02/11/2019    Procedure: COLONOSCOPY WITH PROPOFOL;  Surgeon: [name], MD;  Location: [place];  Service: Gastroenterology;  Laterality: N/A;  . ESOPHAGOGASTRODUODENOSCOPY (EGD) WITH PROPOFOL N/A 02/11/2019   Procedure: ESOPHAGOGASTRODUODENOSCOPY (EGD) WITH PROPOFOL;  Surgeon: [name], MD;  Location: [facility] ENDOSCOPY;  Service: Gastroenterology;  Laterality: N/A;  . HEMOSTASIS CLIP PLACEMENT  02/11/2019   Procedure: HEMOSTASIS CLIP PLACEMENT;  Surgeon: [name], MD;  Location: [place];  Service: Gastroenterology;;  . HEMOSTASIS CONTROL  02/11/2019   Procedure: HEMOSTASIS CONTROL;  Surgeon: [name], MD;  Location: [place];  Service: Gastroenterology;;  . HOT HEMOSTASIS N/A 02/11/2019   Procedure: HOT HEMOSTASIS (ARGON PLASMA COAGULATION/BICAP);  Surgeon: [name], MD;  Location: [facility] ENDOSCOPY;  Service: Gastroenterology;  Laterality: N/A;  . LOOP RECORDER INSERTION N/A 12/07/2017   Procedure: LOOP RECORDER INSERTION;  Surgeon: Constance Haw, MD;  Location: [place] CV LAB;  Service: Cardiovascular;  Laterality: N/A;  . NO PAST SURGERIES    . TEE WITHOUT CARDIOVERSION N/A 12/07/2017   Procedure: TRANSESOPHAGEAL ECHOCARDIOGRAM (TEE);  Surgeon: Dorothy Spark, MD;  Location: [facility] ENDOSCOPY;  Service: Cardiovascular;  Laterality: N/A;       No family history on file.  Social History   Tobacco Use  . Smoking status: Current Every Day Smoker    Packs/day: 0.50    Years: 24.00    Pack years: 12.00  Types: Cigarettes  . Smokeless tobacco: Never Used  Substance Use Topics  . Alcohol use: No  . Drug use: No    Home Medications Prior to Admission medications   Medication Sig Start Date End Date Taking? Authorizing Provider  acetaminophen (TYLENOL) 500 MG tablet Take 500-1,000 mg by mouth every 8 (eight) hours as needed for mild pain.    Yes [provider]  alum & mag hydroxide-simeth (MYLANTA) 200-200-20 MG/5ML suspension Take 30 mLs by mouth as  needed for indigestion or heartburn.   Yes [provider]  atorvastatin (LIPITOR) 10 MG tablet Take 1 tablet (10 mg total) by mouth daily at 6 PM. Patient taking differently: Take 40 mg by mouth daily.  11/19/18  Yes Meccariello, Bernita Raisin, DO  clopidogrel (PLAVIX) 75 MG tablet Take 75 mg by mouth daily.   Yes [provider]  donepezil (ARICEPT) 10 MG tablet Take 10 mg by mouth every morning.   Yes [provider]  ferrous sulfate 325 (65 FE) MG tablet Take 325 mg by mouth 3 (three) times a week. Mon, Wed, Fri   Yes [provider]  finasteride (PROSCAR) 5 MG tablet Take 5 mg by mouth daily.   Yes [provider]  guaiFENesin (ROBITUSSIN) 100 MG/5ML SOLN Take 10 mLs by mouth every 4 (four) hours as needed for cough or to loosen phlegm.   Yes [provider]  lisinopril (ZESTRIL) 10 MG tablet Take 1 tablet (10 mg total) by mouth daily. 02/15/19 07/30/19 Yes Madalyn Rob, MD  loperamide (IMODIUM) 2 MG capsule Take 2 mg by mouth as needed for diarrhea or loose stools (not to exceed 8 doses in 24hrs).   Yes [provider]  magnesium hydroxide (MILK OF MAGNESIA) 400 MG/5ML suspension Take 30 mLs by mouth at bedtime as needed for mild constipation.    Yes [provider]  neomycin-bacitracin-polymyxin (NEOSPORIN) OINT Apply 1 application topically as needed for wound care.   Yes [provider]  pantoprazole (PROTONIX) 40 MG tablet Take 40 mg by mouth daily.   Yes [provider]  Vitamin D, Ergocalciferol, (DRISDOL) 1.25 MG (50000 UT) CAPS capsule Take 50,000 Units by mouth every 7 (seven) days. Wednesday   Yes [provider]    Allergies    Patient has no known allergies.  Review of Systems   Review of Systems  Unable to perform ROS: Dementia    Physical Exam Updated Vital Signs BP 137/88   Pulse 84   Temp 99.1 F (37.3 C) (Oral)   Resp (!) 21   SpO2 98%   Physical Exam Vitals and nursing  note reviewed.  Constitutional:      Appearance: He is well-developed.     Comments: Pleasantly demented  HENT:     Head: Normocephalic and atraumatic.  Eyes:     Conjunctiva/sclera: Conjunctivae normal.     Comments: Pale conjunctiva  Cardiovascular:     Rate and Rhythm: Normal rate and regular rhythm.     Heart sounds: No murmur.  Pulmonary:     Effort: Pulmonary effort is normal. No respiratory distress.     Breath sounds: Normal breath sounds.  Abdominal:     Palpations: Abdomen is soft.     Tenderness: There is no abdominal tenderness.  Genitourinary:    Comments: Stool brown, no hemorrhoids or fissures Musculoskeletal:     Cervical back: Neck supple.  Skin:    General: Skin is warm and dry.     Capillary  Refill: Capillary refill takes less than 2 seconds.  Neurological:     Mental Status: He is alert.     ED Results / Procedures / Treatments   Labs (all labs ordered are listed, but only abnormal results are displayed) Labs Reviewed  CBC WITH DIFFERENTIAL/PLATELET - Abnormal; Notable for the following components:      Result Value   RBC 3.15 (*)    Hemoglobin 5.8 (*)    HCT 23.1 (*)    MCV 73.3 (*)    MCH 18.4 (*)    MCHC 25.1 (*)    RDW 25.4 (*)    nRBC 0.7 (*)    Lymphs Abs 0.5 (*)    All other components within normal limits  BASIC METABOLIC PANEL - Abnormal; Notable for the following components:   Sodium 150 (*)    Chloride 114 (*)    Glucose, Bld 140 (*)    BUN 44 (*)    Creatinine, Ser 1.90 (*)    GFR calc non Af Amer 36 (*)    GFR calc Af Amer 42 (*)    All other components within normal limits  CBC WITH DIFFERENTIAL/PLATELET - Abnormal; Notable for the following components:   RBC 3.27 (*)    Hemoglobin 6.1 (*)    HCT 24.2 (*)    MCV 74.0 (*)    MCH 18.7 (*)    MCHC 25.2 (*)    RDW 25.5 (*)    nRBC 0.4 (*)    Lymphs Abs 0.4 (*)    All other components within normal limits  D-DIMER, QUANTITATIVE (NOT AT [facility]) - Abnormal; Notable for the  following components:   D-Dimer, Quant 4.21 (*)    All other components within normal limits  LACTATE DEHYDROGENASE - Abnormal; Notable for the following components:   LDH 321 (*)    All other components within normal limits  TRIGLYCERIDES - Abnormal; Notable for the following components:   Triglycerides 160 (*)    All other components within normal limits  FIBRINOGEN - Abnormal; Notable for the following components:   Fibrinogen 609 (*)    All other components within normal limits  C-REACTIVE PROTEIN - Abnormal; Notable for the following components:   CRP 9.7 (*)    All other components within normal limits  POC OCCULT BLOOD, ED - Abnormal; Notable for the following components:   Fecal Occult Bld POSITIVE (*)    All other components within normal limits  TROPONIN I (HIGH SENSITIVITY) - Abnormal; Notable for the following components:   Troponin I (High Sensitivity) 24 (*)    All other components within normal limits  CULTURE, BLOOD (ROUTINE X 2)  CULTURE, BLOOD (ROUTINE X 2)  LACTIC ACID, PLASMA  PROCALCITONIN  FERRITIN  LACTIC ACID, PLASMA  TYPE AND SCREEN  PREPARE RBC (CROSSMATCH)  TROPONIN I (HIGH SENSITIVITY)    EKG EKG Interpretation  Date/Time:  Saturday July 30 2019 10:12:22 EST Ventricular Rate:  98 PR Interval:    QRS Duration: 103 QT Interval:  322 QTC Calculation: 412 R Axis:   70 Text Interpretation: Sinus rhythm Confirmed by Madalyn Rob [phone]) on 07/30/2019 10:35:13 AM   Radiology DG Chest Portable 1 View  Result Date: 07/30/2019 CLINICAL DATA:  Chest pain.  COVID-19 positive EXAM: PORTABLE CHEST 1 VIEW COMPARISON:  Dec 04, 2017 FINDINGS: There is ill-defined opacity in the right base. Lungs elsewhere clear. Heart is upper normal in size with pulmonary vascularity normal. No adenopathy. No bone lesions. Loop recorder present on  the left. No pneumothorax. There is aortic atherosclerosis. IMPRESSION: Ill-defined opacity right base, likely developing  pneumonia, potentially of atypical organism etiology. Lungs elsewhere clear. Heart upper normal in size. No adenopathy. Aortic Atherosclerosis (ICD10-I70.0). Electronically Signed   By: Lowella Grip III M.D.   On: 07/30/2019 11:29    Procedures .Critical Care Performed by: Lucrezia Starch, MD Authorized by: Lucrezia Starch, MD   Critical care provider statement:    Critical care time (minutes):  41   Critical care was necessary to treat or prevent imminent or life-threatening deterioration of the following conditions: GI Bleed.   Critical care was time spent personally by me on the following activities:  Discussions with consultants, evaluation of patient's response to treatment, examination of patient, ordering and performing treatments and interventions, ordering and review of laboratory studies, ordering and review of radiographic studies, pulse oximetry, re-evaluation of patient's condition, obtaining history from patient or surrogate and review of old charts   (including critical care time)  Medications Ordered in ED Medications  0.9 %  sodium chloride infusion (has no administration in time range)  pantoprazole (PROTONIX) injection 40 mg (has no administration in time range)    ED Course  I have reviewed the triage vital signs and the nursing notes.  Pertinent labs & imaging results that were available during my care of the patient were reviewed by me and considered in my medical decision making (see chart for details).  Clinical Course as of Jul 29 1357  Sat Jul 30, 2019  1041 Discussed case with patient's son, legal guardian, no additional history except that he was diagnosed yesterday with Covid, son believes it was on a routine test, unaware of any other medical concerns, no known history of coronary artery disease, lung disease, does have history of stroke and dementia   [RD]  1224 Second bedside chaperone rectal, dark brown, no frank dark or tarry stool or bright  red blood noted   [RD]  1300 Reviewed op note from last scope, multiple angioectasias; will c/s GI, admit medicine, start IV PPI   [RD]    Clinical Course User Index [RD] Lucrezia Starch, MD   MDM Rules/Calculators/A&P                      64 year old male history of CVA, on Plavix, dementia presents to ER with reported fever, reported COVID-19 positive test.  Here patient is noted to be well-appearing in no distress, he denied any ongoing complaints, vital signs were stable.  Basic labs were obtained which demonstrated significant drop in his hemoglobin to 5.8.  His stool was brown but Hemoccult positive.  Has prior history of GI bleed.  Placed order for 2 unit transfusion.  Placed order for GI consult however at time of admission had not gotten a call back.  Discussed with internal medicine residency team.  They will come evaluate patient and place admission orders.  Final Clinical Impression(s) / ED Diagnoses Final diagnoses:  COVID-19  Gastrointestinal hemorrhage, unspecified gastrointestinal hemorrhage type  Acute blood loss anemia    Rx / DC Orders ED Discharge Orders    None       Lucrezia Starch, MD 07/30/19 1359    Lucrezia Starch, MD 08/19/19 1627

## 2019-07-30 NOTE — ED Notes (Signed)
Date and time results received: 07/30/19    Test: hgb  Critical Value: 5.8  Name of Provider Notified: Roslynn Amble

## 2019-07-31 DIAGNOSIS — F0150 Vascular dementia without behavioral disturbance: Secondary | ICD-10-CM

## 2019-07-31 DIAGNOSIS — Z9889 Other specified postprocedural states: Secondary | ICD-10-CM

## 2019-07-31 DIAGNOSIS — E785 Hyperlipidemia, unspecified: Secondary | ICD-10-CM

## 2019-07-31 DIAGNOSIS — D509 Iron deficiency anemia, unspecified: Secondary | ICD-10-CM

## 2019-07-31 DIAGNOSIS — Z8673 Personal history of transient ischemic attack (TIA), and cerebral infarction without residual deficits: Secondary | ICD-10-CM

## 2019-07-31 DIAGNOSIS — G309 Alzheimer's disease, unspecified: Secondary | ICD-10-CM

## 2019-07-31 DIAGNOSIS — F0280 Dementia in other diseases classified elsewhere without behavioral disturbance: Secondary | ICD-10-CM

## 2019-07-31 DIAGNOSIS — Z79899 Other long term (current) drug therapy: Secondary | ICD-10-CM

## 2019-07-31 DIAGNOSIS — D649 Anemia, unspecified: Secondary | ICD-10-CM

## 2019-07-31 DIAGNOSIS — N179 Acute kidney failure, unspecified: Secondary | ICD-10-CM

## 2019-07-31 DIAGNOSIS — I10 Essential (primary) hypertension: Secondary | ICD-10-CM

## 2019-07-31 LAB — COMPREHENSIVE METABOLIC PANEL
ALT: 21 U/L (ref 0–44)
AST: 42 U/L — ABNORMAL HIGH (ref 15–41)
Albumin: 2.8 g/dL — ABNORMAL LOW (ref 3.5–5.0)
Alkaline Phosphatase: 31 U/L — ABNORMAL LOW (ref 38–126)
Anion gap: 13 (ref 5–15)
BUN: 36 mg/dL — ABNORMAL HIGH (ref 8–23)
CO2: 23 mmol/L (ref 22–32)
Calcium: 9.6 mg/dL (ref 8.9–10.3)
Chloride: 112 mmol/L — ABNORMAL HIGH (ref 98–111)
Creatinine, Ser: 1.75 mg/dL — ABNORMAL HIGH (ref 0.61–1.24)
GFR calc Af Amer: 46 mL/min — ABNORMAL LOW (ref >60)
GFR calc non Af Amer: 40 mL/min — ABNORMAL LOW (ref >60)
Glucose, Bld: 120 mg/dL — ABNORMAL HIGH (ref 70–99)
Potassium: 3.9 mmol/L (ref 3.5–5.1)
Sodium: 148 mmol/L — ABNORMAL HIGH (ref 135–145)
Total Bilirubin: 0.8 mg/dL (ref 0.3–1.2)
Total Protein: 6.6 g/dL (ref 6.5–8.1)

## 2019-07-31 LAB — CBC WITH DIFFERENTIAL/PLATELET
Abs Immature Granulocytes: 0.05 10*3/uL (ref 0.00–0.07)
Basophils Absolute: 0 10*3/uL (ref 0.0–0.1)
Basophils Relative: 0 %
Eosinophils Absolute: 0 10*3/uL (ref 0.0–0.5)
Eosinophils Relative: 0 %
HCT: 29.4 % — ABNORMAL LOW (ref 39.0–52.0)
Hemoglobin: 8.3 g/dL — ABNORMAL LOW (ref 13.0–17.0)
Immature Granulocytes: 1 %
Lymphocytes Relative: 7 %
Lymphs Abs: 0.4 10*3/uL — ABNORMAL LOW (ref 0.7–4.0)
MCH: 21.2 pg — ABNORMAL LOW (ref 26.0–34.0)
MCHC: 28.2 g/dL — ABNORMAL LOW (ref 30.0–36.0)
MCV: 75 fL — ABNORMAL LOW (ref 80.0–100.0)
Monocytes Absolute: 0.4 10*3/uL (ref 0.1–1.0)
Monocytes Relative: 6 %
Neutro Abs: 5.5 10*3/uL (ref 1.7–7.7)
Neutrophils Relative %: 86 %
Platelets: 161 10*3/uL (ref 150–400)
RBC: 3.92 MIL/uL — ABNORMAL LOW (ref 4.22–5.81)
RDW: 23.5 % — ABNORMAL HIGH (ref 11.5–15.5)
WBC: 6.4 10*3/uL (ref 4.0–10.5)
nRBC: 0.8 % — ABNORMAL HIGH (ref 0.0–0.2)

## 2019-07-31 LAB — FERRITIN: Ferritin: 48 ng/mL (ref 24–336)

## 2019-07-31 LAB — C-REACTIVE PROTEIN: CRP: 10.3 mg/dL — ABNORMAL HIGH (ref <1.0)

## 2019-07-31 LAB — D-DIMER, QUANTITATIVE: D-Dimer, Quant: 8.42 ug/mL-FEU — ABNORMAL HIGH (ref 0.00–0.50)

## 2019-07-31 NOTE — ED Notes (Signed)
ED TO INPATIENT HANDOFF REPORT  ED Nurse Name and Phone #: Rainey Kahrs   S Name/Age/Gender Karl Miller 64 y.o. male Room/Bed: 013C/013C  Code Status   Code Status: Full Code  Home/SNF/Other Home Patient oriented to: self Is this baseline? dementia at baseline  Triage Complete: Triage complete  Chief Complaint Acute on chronic blood loss anemia [D62]  Triage Note Pt to ED via EMS from [place], COVID positive c/o chest pain that started at 0845 this morning. Hx of dementia, orientation at baseline. # 20 Left forearm. EMS attempted to give aspirin, but pt kept them in his mouth without swallowing. Last VS 138/78, 104 pulse, RR 18, 100 % RA Temp100. CBG 219.     Allergies No Known Allergies  Level of Care/Admitting Diagnosis ED Disposition    ED Disposition Condition [facility] Area: [place] [[id number]]  Level of Care: Telemetry Medical [104]  Covid Evaluation: Confirmed COVID Positive  Diagnosis: Acute on chronic blood loss anemia [[id number]]  Admitting Physician: Aldine Contes [[id number]]  Attending Physician: Aldine Contes [phone]  Estimated length of stay: past midnight tomorrow  Certification:: I certify this patient will need inpatient services for at least 2 midnights       B Medical/Surgery History Past Medical History:  Diagnosis Date  . Acute encephalopathy 12/03/2017   [name] 12/04/2017  . High cholesterol   . Hypertension   . Stroke ([place])   . TIA (transient ischemic attack) 12/03/2017   [name] 12/04/2017   Past Surgical History:  Procedure Laterality Date  . COLONOSCOPY WITH PROPOFOL N/A 02/11/2019   Procedure: COLONOSCOPY WITH PROPOFOL;  Surgeon: [name], MD;  Location: [facility];  Service: Gastroenterology;  Laterality: N/A;  . ESOPHAGOGASTRODUODENOSCOPY (EGD) WITH PROPOFOL N/A 02/11/2019   Procedure: ESOPHAGOGASTRODUODENOSCOPY (EGD) WITH PROPOFOL;  Surgeon: [name], MD;  Location: [facility]  ENDOSCOPY;  Service: Gastroenterology;  Laterality: N/A;  . HEMOSTASIS CLIP PLACEMENT  02/11/2019   Procedure: HEMOSTASIS CLIP PLACEMENT;  Surgeon: [name], MD;  Location: [place];  Service: Gastroenterology;;  . HEMOSTASIS CONTROL  02/11/2019   Procedure: HEMOSTASIS CONTROL;  Surgeon: [name], MD;  Location: [facility];  Service: Gastroenterology;;  . HOT HEMOSTASIS N/A 02/11/2019   Procedure: HOT HEMOSTASIS (ARGON PLASMA COAGULATION/BICAP);  Surgeon: [name], MD;  Location: [facility] ENDOSCOPY;  Service: Gastroenterology;  Laterality: N/A;  . LOOP RECORDER INSERTION N/A 12/07/2017   Procedure: LOOP RECORDER INSERTION;  Surgeon: Constance Haw, MD;  Location: [place] [facility];  Service: Cardiovascular;  Laterality: N/A;  . NO PAST SURGERIES    . TEE WITHOUT CARDIOVERSION N/A 12/07/2017   Procedure: TRANSESOPHAGEAL ECHOCARDIOGRAM (TEE);  Surgeon: Dorothy Spark, MD;  Location: [facility] ENDOSCOPY;  Service: Cardiovascular;  Laterality: N/A;     A IV Location/Drains/Wounds Patient Lines/Drains/Airways Status   Active Line/Drains/Airways    Name:   Placement date:   Placement time:   Site:   Days:   Peripheral IV 07/30/19 Left Forearm   07/30/19    1100    Forearm   1   Peripheral IV 07/30/19 Right Antecubital   07/30/19    --    Antecubital   1   External Urinary Catheter   02/08/19    1812    --   173          Intake/Output Last 24 hours  Intake/Output Summary (Last 24 hours) at 07/31/2019 1707 Last data filed at 07/31/2019 1138 Gross per 24  hour  Intake 415 ml  Output --  Net 415 ml    Labs/Imaging Results for orders placed or performed during the hospital encounter of 07/30/19 (from the past 48 hour(s))  CBC with Differential     Status: Abnormal   Collection Time: 07/30/19 10:28 AM  Result Value Ref Range   WBC 5.6 4.0 - 10.5 K/uL   RBC 3.15 (L) 4.22 - 5.81 MIL/uL   Hemoglobin 5.8 (LL) 13.0 - 17.0 g/dL    Comment: REPEATED TO VERIFY Reticulocyte  Hemoglobin testing may be clinically indicated, consider ordering this additional test [id number] THIS CRITICAL RESULT HAS VERIFIED AND BEEN CALLED TO RN A STRADER BY LESLIE BENFIELD ON 01 09 2021 AT 1497, AND HAS BEEN READ BACK.     HCT 23.1 (L) 39.0 - 52.0 %   MCV 73.3 (L) 80.0 - 100.0 fL   MCH 18.4 (L) 26.0 - 34.0 pg   MCHC 25.1 (L) 30.0 - 36.0 g/dL   RDW 25.4 (H) 11.5 - 15.5 %   Platelets 191 150 - 400 K/uL   nRBC 0.7 (H) 0.0 - 0.2 %   Neutrophils Relative % 85 %   Neutro Abs 4.8 1.7 - 7.7 K/uL   Lymphocytes Relative 9 %   Lymphs Abs 0.5 (L) 0.7 - 4.0 K/uL   Monocytes Relative 5 %   Monocytes Absolute 0.3 0.1 - 1.0 K/uL   Eosinophils Relative 0 %   Eosinophils Absolute 0.0 0.0 - 0.5 K/uL   Basophils Relative 0 %   Basophils Absolute 0.0 0.0 - 0.1 K/uL   Immature Granulocytes 1 %   Abs Immature Granulocytes 0.05 0.00 - 0.07 K/uL   Ovalocytes PRESENT     Comment: Performed at [facility], [address], [place], [place] [address]  Basic metabolic panel     Status: Abnormal   Collection Time: 07/30/19 10:28 AM  Result Value Ref Range   Sodium 150 (H) 135 - 145 mmol/L   Potassium 4.1 3.5 - 5.1 mmol/L   Chloride 114 (H) 98 - 111 mmol/L   CO2 25 22 - 32 mmol/L   Glucose, Bld 140 (H) 70 - 99 mg/dL   BUN 44 (H) 8 - 23 mg/dL   Creatinine, Ser 1.90 (H) 0.61 - 1.24 mg/dL   Calcium 9.9 8.9 - 10.3 mg/dL   GFR calc non Af Amer 36 (L) >60 mL/min   GFR calc Af Amer 42 (L) >60 mL/min   Anion gap 11 5 - 15    Comment: Performed at [facility] [address] [place], [place] [address]  Troponin I (High Sensitivity)     Status: Abnormal   Collection Time: 07/30/19 10:28 AM  Result Value Ref Range   Troponin I (High Sensitivity) 24 (H) <18 ng/L    Comment: (NOTE) Elevated high sensitivity troponin I (hsTnI) values and significant  changes across serial measurements may suggest ACS but many other  chronic and acute conditions are known to elevate hsTnI results.  Refer  to the "Links" section for chest pain algorithms and additional  guidance. Performed at [facility], [place] [address], [place], [place] [address]   Prepare RBC     Status: None   Collection Time: 07/30/19 11:30 AM  Result Value Ref Range   Order Confirmation      ORDER PROCESSED BY BLOOD BANK Performed at [facility], [place] [address] [place], [place] [address]   Type and screen [facility]     Status: None (Preliminary result)   Collection Time: 07/30/19 11:46 AM  Result Value Ref Range   ABO/RH(D) O NEG    Antibody Screen POS    Sample Expiration 08/02/2019,2359    Antibody Identification ANTI D ANTI C    DAT, IgG NEG    PT AG Type NEGATIVE FOR C ANTIGEN    Unit Number [id number]    Blood Component Type RED CELLS,LR    Unit division 00    Status of Unit ISSUED,FINAL    Donor AG Type NEGATIVE FOR C ANTIGEN    Transfusion Status OK TO TRANSFUSE    Crossmatch Result COMPATIBLE    Unit Number [id number]    Blood Component Type RED CELLS,LR    Unit division 00    Status of Unit ISSUED,FINAL    Donor AG Type NEGATIVE FOR C ANTIGEN    Transfusion Status OK TO TRANSFUSE    Crossmatch Result COMPATIBLE    Unit Number [id number]    Blood Component Type RBC LR PHER1    Unit division 00    Status of Unit ALLOCATED    Transfusion Status OK TO TRANSFUSE    Crossmatch Result COMPATIBLE    Donor AG Type NEGATIVE FOR C ANTIGEN    Unit Number [id number]    Blood Component Type RED CELLS,LR    Unit division 00    Status of Unit ALLOCATED    Transfusion Status OK TO TRANSFUSE    Crossmatch Result COMPATIBLE    Donor AG Type NEGATIVE FOR C ANTIGEN   Lactic acid, plasma     Status: None   Collection Time: 07/30/19 11:52 AM  Result Value Ref Range   Lactic Acid, Venous 1.3 0.5 - 1.9 mmol/L    Comment: Performed at [facility] [address] [place], [place] [address]  Blood Culture (routine x 2)     Status: None (Preliminary result)    Collection Time: 07/30/19 11:59 AM   Specimen: BLOOD  Result Value Ref Range   Specimen Description BLOOD RIGHT ANTECUBITAL    Special Requests      BOTTLES DRAWN AEROBIC AND ANAEROBIC Blood Culture adequate volume   Culture      NO GROWTH < 24 HOURS Performed at [facility], [place] [address], [place], [place] [address]    Report Status PENDING   CBC WITH DIFFERENTIAL     Status: Abnormal   Collection Time: 07/30/19 12:00 PM  Result Value Ref Range   WBC 5.2 4.0 - 10.5 K/uL   RBC 3.27 (L) 4.22 - 5.81 MIL/uL   Hemoglobin 6.1 (LL) 13.0 - 17.0 g/dL    Comment: REPEATED TO VERIFY CRITICAL VALUE NOTED.  VALUE IS CONSISTENT WITH PREVIOUSLY REPORTED AND CALLED VALUE. Reticulocyte Hemoglobin testing may be clinically indicated, consider ordering this additional test [id number]    HCT 24.2 (L) 39.0 - 52.0 %   MCV 74.0 (L) 80.0 - 100.0 fL   MCH 18.7 (L) 26.0 - 34.0 pg   MCHC 25.2 (L) 30.0 - 36.0 g/dL   RDW 25.5 (H) 11.5 - 15.5 %   Platelets 192 150 - 400 K/uL   nRBC 0.4 (H) 0.0 - 0.2 %   Neutrophils Relative % 87 %   Lymphocytes Relative 8 %   Monocytes Relative 5 %   Eosinophils Relative 0 %   Basophils Relative 0 %   Band Neutrophils 0 %   Metamyelocytes Relative 0 %   Myelocytes 0 %  Promyelocytes Relative 0 %   Blasts 0 %   nRBC 0 0 /100 WBC   Other 0 %   Neutro Abs 4.5 1.7 - 7.7 K/uL   Lymphs Abs 0.4 (L) 0.7 - 4.0 K/uL   Monocytes Absolute 0.3 0.1 - 1.0 K/uL   Eosinophils Absolute 0.0 0.0 - 0.5 K/uL   Basophils Absolute 0.0 0.0 - 0.1 K/uL   Abs Immature Granulocytes 0.00 0.00 - 0.07 K/uL   Smear Review MORPHOLOGY UNREMARKABLE     Comment: Performed at [facility] [address] [place], [place] [address]  D-dimer, quantitative     Status: Abnormal   Collection Time: 07/30/19 12:00 PM  Result Value Ref Range   D-Dimer, Quant 4.21 (H) 0.00 - 0.50 ug/mL-FEU    Comment: (NOTE) At the manufacturer cut-off of 0.50 ug/mL FEU, this assay has been documented to  exclude PE with a sensitivity and negative predictive value of 97 to 99%.  At this time, this assay has not been approved by the FDA to exclude DVT/VTE. Results should be correlated with clinical presentation. Performed at [facility], [place] [address] [place], [place] [address]   Procalcitonin     Status: None   Collection Time: 07/30/19 12:00 PM  Result Value Ref Range   Procalcitonin 0.21 ng/mL    Comment:        Interpretation: PCT (Procalcitonin) <= 0.5 ng/mL: Systemic infection (sepsis) is not likely. Local bacterial infection is possible. (NOTE)       Sepsis PCT Algorithm           Lower Respiratory Tract                                      Infection PCT Algorithm    ----------------------------     ----------------------------         PCT < 0.25 ng/mL                PCT < 0.10 ng/mL         Strongly encourage             Strongly discourage   discontinuation of antibiotics    initiation of antibiotics    ----------------------------     -----------------------------       PCT 0.25 - 0.50 ng/mL            PCT 0.10 - 0.25 ng/mL               OR       >80% decrease in PCT            Discourage initiation of                                            antibiotics      Encourage discontinuation           of antibiotics    ----------------------------     -----------------------------         PCT >= 0.50 ng/mL              PCT 0.26 - 0.50 ng/mL               AND        <80% decrease in PCT  Encourage initiation of                                             antibiotics       Encourage continuation           of antibiotics    ----------------------------     -----------------------------        PCT >= 0.50 ng/mL                  PCT > 0.50 ng/mL               AND         increase in PCT                  Strongly encourage                                      initiation of antibiotics    Strongly encourage escalation           of antibiotics                                      -----------------------------                                           PCT <= 0.25 ng/mL                                                 OR                                        > 80% decrease in PCT                                     Discontinue / Do not initiate                                             antibiotics Performed at [facility], [address] [place], [place] [address]   Lactate dehydrogenase     Status: Abnormal   Collection Time: 07/30/19 12:00 PM  Result Value Ref Range   LDH 321 (H) 98 - 192 U/L    Comment: Performed at [facility], [place] [address], [place], [place] [address]  Ferritin     Status: None   Collection Time: 07/30/19 12:00 PM  Result Value Ref Range   Ferritin 40 24 - 336 ng/mL    Comment: Performed at [facility], [place] [address] [place], [place] [address]  Triglycerides     Status: Abnormal   Collection Time: 07/30/19 12:00 PM  Result Value Ref  Range   Triglycerides 160 (H) <150 mg/dL    Comment: Performed at [facility] [address] [place], [place] [address]  Fibrinogen     Status: Abnormal   Collection Time: 07/30/19 12:00 PM  Result Value Ref Range   Fibrinogen 609 (H) 210 - 475 mg/dL    Comment: Performed at [facility] [address], [place], [place] [address]  C-reactive protein     Status: Abnormal   Collection Time: 07/30/19 12:00 PM  Result Value Ref Range   CRP 9.7 (H) <1.0 mg/dL    Comment: Performed at [facility], [place] [address] [place], [place] [address]  Troponin I (High Sensitivity)     Status: Abnormal   Collection Time: 07/30/19 12:00 PM  Result Value Ref Range   Troponin I (High Sensitivity) 22 (H) <18 ng/L    Comment: (NOTE) Elevated high sensitivity troponin I (hsTnI) values and significant  changes across serial measurements may suggest ACS but many other  chronic and acute conditions are known to elevate hsTnI results.  Refer to the "Links"  section for chest pain algorithms and additional  guidance. Performed at [facility], [place] [address], [place], [place] [address]   Iron and TIBC     Status: Abnormal   Collection Time: 07/30/19 12:00 PM  Result Value Ref Range   Iron 12 (L) 45 - 182 ug/dL   TIBC 339 250 - 450 ug/dL   Saturation Ratios 4 (L) 17.9 - 39.5 %   UIBC 327 ug/dL    Comment: Performed at [facility] [address], [place], [place] [address]  POC occult blood, ED     Status: Abnormal   Collection Time: 07/30/19 12:16 PM  Result Value Ref Range   Fecal Occult Bld POSITIVE (A) NEGATIVE  Lactic acid, plasma     Status: None   Collection Time: 07/30/19  3:45 PM  Result Value Ref Range   Lactic Acid, Venous 1.2 0.5 - 1.9 mmol/L    Comment: Performed at [facility] [address], [place], [place] [address]  Blood Culture (routine x 2)     Status: None (Preliminary result)   Collection Time: 07/30/19  3:45 PM   Specimen: BLOOD  Result Value Ref Range   Specimen Description BLOOD RIGHT ANTECUBITAL    Special Requests      BOTTLES DRAWN AEROBIC AND ANAEROBIC Blood Culture results may not be optimal due to an inadequate volume of blood received in culture bottles   Culture      NO GROWTH < 24 HOURS Performed at [facility] [address], [place], [place] [address]    Report Status PENDING   POC SARS Coronavirus 2 Ag-ED - Nasal Swab (BD Veritor Kit)     Status: Abnormal   Collection Time: 07/30/19  4:29 PM  Result Value Ref Range   SARS Coronavirus 2 Ag POSITIVE (A) NEGATIVE    Comment: (NOTE) SARS-CoV-2 antigen PRESENT. Positive results indicate the presence of viral antigens, but clinical correlation with patient history and other diagnostic information is necessary to determine patient infection status.  Positive results do not rule out bacterial infection or co-infection  with other viruses. False positive results are rare but can occur, and confirmatory RT-PCR testing  may be appropriate in some circumstances. The expected result is Negative. Fact Sheet for Patients: PodPark.tn Fact Sheet for Providers: GiftContent.is  This test is not yet approved or  cleared by the [place] and  has been authorized for detection and/or diagnosis of SARS-CoV-2 by FDA under an Emergency Use Authorization (EUA).  This EUA will remain in effect (meaning this test can be used) for the duration of  the COVID-19 declaration under Section 564(b)(1) of the Act, 21 U.S.C. section 360bbb-3(b)(1), unless the a uthorization is terminated or revoked sooner.   Respiratory Panel by RT PCR (Flu A&B, Covid) - Nasopharyngeal Swab     Status: Abnormal   Collection Time: 07/30/19  8:35 PM   Specimen: Nasopharyngeal Swab  Result Value Ref Range   SARS Coronavirus 2 by RT PCR POSITIVE (A) NEGATIVE    Comment: RESULT CALLED TO, READ BACK BY AND VERIFIED WITH: RN WILL WALLACE AT 2142 BY MESSAN HOUEGNIFIO ON 07/30/2019 (NOTE) SARS-CoV-2 target nucleic acids are DETECTED. SARS-CoV-2 RNA is generally detectable in upper respiratory specimens  during the acute phase of infection. Positive results are indicative of the presence of the identified virus, but do not rule out bacterial infection or co-infection with other pathogens not detected by the test. Clinical correlation with patient history and other diagnostic information is necessary to determine patient infection status. The expected result is Negative. Fact Sheet for Patients:  PinkCheek.be Fact Sheet for Healthcare Providers: GravelBags.it This test is not yet approved or cleared by the [place] FDA and  has been authorized for detection and/or diagnosis of SARS-CoV-2 by FDA under an Emergency Use Authorization (EUA).  This EUA will remain in effect (meaning thi s test can be used) for the duration of  the  COVID-19 declaration under Section 564(b)(1) of the Act, 21 U.S.C. section 360bbb-3(b)(1), unless the authorization is terminated or revoked sooner.    Influenza A by PCR NEGATIVE NEGATIVE   Influenza B by PCR NEGATIVE NEGATIVE    Comment: (NOTE) The Xpert Xpress SARS-CoV-2/FLU/RSV assay is intended as an aid in  the diagnosis of influenza from Nasopharyngeal swab specimens and  should not be used as a sole basis for treatment. Nasal washings and  aspirates are unacceptable for Xpert Xpress SARS-CoV-2/FLU/RSV  testing. Fact Sheet for Patients: PinkCheek.be Fact Sheet for Healthcare Providers: GravelBags.it This test is not yet approved or cleared by the [place] FDA and  has been authorized for detection and/or diagnosis of SARS-CoV-2 by  FDA under an Emergency Use Authorization (EUA). This EUA will remain  in effect (meaning this test can be used) for the duration of the  Covid-19 declaration under Section 564(b)(1) of the Act, 21  U.S.C. section 360bbb-3(b)(1), unless the authorization is  terminated or revoked. Performed at [facility], [place] [address], [place], [place] [address]   CBC with Differential/Platelet     Status: Abnormal   Collection Time: 07/31/19  3:22 AM  Result Value Ref Range   WBC 6.4 4.0 - 10.5 K/uL   RBC 3.92 (L) 4.22 - 5.81 MIL/uL   Hemoglobin 8.3 (L) 13.0 - 17.0 g/dL    Comment: REPEATED TO VERIFY POST TRANSFUSION SPECIMEN Reticulocyte Hemoglobin testing may be clinically indicated, consider ordering this additional test [id number]    HCT 29.4 (L) 39.0 - 52.0 %   MCV 75.0 (L) 80.0 - 100.0 fL   MCH 21.2 (L) 26.0 - 34.0 pg   MCHC 28.2 (L) 30.0 - 36.0 g/dL   RDW 23.5 (H) 11.5 - 15.5 %   Platelets 161 150 - 400 K/uL   nRBC 0.8 (H) 0.0 - 0.2 %   Neutrophils Relative % 86 %  Neutro Abs 5.5 1.7 - 7.7 K/uL   Lymphocytes Relative 7 %   Lymphs Abs 0.4 (L) 0.7 - 4.0 K/uL   Monocytes  Relative 6 %   Monocytes Absolute 0.4 0.1 - 1.0 K/uL   Eosinophils Relative 0 %   Eosinophils Absolute 0.0 0.0 - 0.5 K/uL   Basophils Relative 0 %   Basophils Absolute 0.0 0.0 - 0.1 K/uL   Immature Granulocytes 1 %   Abs Immature Granulocytes 0.05 0.00 - 0.07 K/uL    Comment: Performed at [facility] [address] [place], [place] [address]  Comprehensive metabolic panel     Status: Abnormal   Collection Time: 07/31/19  3:22 AM  Result Value Ref Range   Sodium 148 (H) 135 - 145 mmol/L   Potassium 3.9 3.5 - 5.1 mmol/L   Chloride 112 (H) 98 - 111 mmol/L   CO2 23 22 - 32 mmol/L   Glucose, Bld 120 (H) 70 - 99 mg/dL   BUN 36 (H) 8 - 23 mg/dL   Creatinine, Ser 1.75 (H) 0.61 - 1.24 mg/dL   Calcium 9.6 8.9 - 10.3 mg/dL   Total Protein 6.6 6.5 - 8.1 g/dL   Albumin 2.8 (L) 3.5 - 5.0 g/dL   AST 42 (H) 15 - 41 U/L   ALT 21 0 - 44 U/L   Alkaline Phosphatase 31 (L) 38 - 126 U/L   Total Bilirubin 0.8 0.3 - 1.2 mg/dL   GFR calc non Af Amer 40 (L) >60 mL/min   GFR calc Af Amer 46 (L) >60 mL/min   Anion gap 13 5 - 15    Comment: Performed at [facility] [address] [place], [place] [address]  C-reactive protein     Status: Abnormal   Collection Time: 07/31/19  3:22 AM  Result Value Ref Range   CRP 10.3 (H) <1.0 mg/dL    Comment: Performed at [facility] [address], [place], [place] [address]  D-dimer, quantitative (not at [facility])     Status: Abnormal   Collection Time: 07/31/19  3:22 AM  Result Value Ref Range   D-Dimer, Quant 8.42 (H) 0.00 - 0.50 ug/mL-FEU    Comment: (NOTE) At the manufacturer cut-off of 0.50 ug/mL FEU, this assay has been documented to exclude PE with a sensitivity and negative predictive value of 97 to 99%.  At this time, this assay has not been approved by the FDA to exclude DVT/VTE. Results should be correlated with clinical presentation. Performed at [facility], [place] [address] , [place] [address]   Ferritin      Status: None   Collection Time: 07/31/19  3:22 AM  Result Value Ref Range   Ferritin 48 24 - 336 ng/mL    Comment: Performed at [facility], [place] [address] [place], [place] [address]   DG Chest Portable 1 View  Result Date: 07/30/2019 CLINICAL DATA:  Chest pain.  COVID-19 positive EXAM: PORTABLE CHEST 1 VIEW COMPARISON:  Dec 04, 2017 FINDINGS: There is ill-defined opacity in the right base. Lungs elsewhere clear. Heart is upper normal in size with pulmonary vascularity normal. No adenopathy. No bone lesions. Loop recorder present on the left. No pneumothorax. There is aortic atherosclerosis. IMPRESSION: Ill-defined opacity right base, likely developing pneumonia, potentially of atypical organism etiology. Lungs elsewhere clear. Heart upper normal in size. No adenopathy. Aortic Atherosclerosis (ICD10-I70.0). Electronically Signed   By: Lowella Grip III M.D.   On:  07/30/2019 11:29    Pending Labs Unresulted Labs (From admission, onward)    Start     Ordered   07/31/19 0500  CBC with Differential/Platelet  Daily,   R     07/30/19 1408   07/31/19 0500  Comprehensive metabolic panel  Daily,   R     07/30/19 1408          Vitals/Pain Today's Vitals   07/31/19 1415 07/31/19 1430 07/31/19 1500 07/31/19 1600  BP: (!) 134/92 (!) 137/94 (!) 133/102 129/87  Pulse: 78 84 80 72  Resp: (!) 21 (!) 22 (!) 22 18  Temp:      TempSrc:      SpO2: 96% 100% 94% 98%  PainSc:        Isolation Precautions Airborne and Contact precautions  Medications Medications  atorvastatin (LIPITOR) tablet 40 mg (40 mg Oral Given 07/31/19 1002)  donepezil (ARICEPT) tablet 10 mg (10 mg Oral Not Given 07/31/19 1437)  finasteride (PROSCAR) tablet 5 mg (5 mg Oral Given 07/31/19 1002)  guaiFENesin (ROBITUSSIN) 100 MG/5ML solution 200 mg (has no administration in time range)  neomycin-bacitracin-polymyxin (NEOSPORIN) ointment 1 application (has no administration in time range)  remdesivir 200 mg in sodium  chloride 0.9% 250 mL IVPB (0 mg Intravenous Stopped 07/30/19 1515)    Followed by  remdesivir 100 mg in sodium chloride 0.9 % 100 mL IVPB (0 mg Intravenous Stopped 07/31/19 1138)  dextrose 5 % solution ( Intravenous Stopped 07/31/19 0026)  pantoprazole (PROTONIX) injection 40 mg (40 mg Intravenous Given 07/31/19 1004)  acetaminophen (TYLENOL) suppository 650 mg (650 mg Rectal Given 07/31/19 0113)  0.9 %  sodium chloride infusion (0 mL/hr Intravenous Stopped 07/31/19 0033)  pantoprazole (PROTONIX) injection 40 mg (40 mg Intravenous Given 07/30/19 1431)  ferumoxytol (FERAHEME) 510 mg in sodium chloride 0.9 % 100 mL IVPB (0 mg Intravenous Stopped 07/31/19 0143)    Mobility walks with person assist     Focused Assessments Cardiac Assessment Handoff:    Lab Results  Component Value Date   CKTOTAL 3,300 (H) 12/05/2017   TROPONINI 0.04 (HH) 12/05/2017   Lab Results  Component Value Date   DDIMER 8.42 (H) 07/31/2019   Does the Patient currently have chest pain? No     R Recommendations: See Admitting Provider Note  Report given to:   Additional Notes:

## 2019-07-31 NOTE — ED Notes (Signed)
Ordered breakfast--[name] 

## 2019-07-31 NOTE — Progress Notes (Addendum)
Date: 07/31/2019  Patient name: Karl Miller  Medical record number: [id number]  Date of birth: [birth year]   I have seen and evaluated Karl Miller and discussed their care with the Residency Team.  In brief, patient is 64 year old male with a past medical history of vascular dementia, Alzheimer's disease, CVA, hypertension, hyperlipidemia and chronic anemia who presented to the ED for chest pain.  Patient unable to provide history and history was obtained from the chart.  Patient resides at [facility].  Per staff there patient started having low-grade fevers approximately 5 to 7 days ago.  Staff have noted decreased oral intake and increased generalized weakness and malaise since these fever started.  Patient tested positive for Covid on July 28, 2019.  Yesterday morning, patient appeared uncomfortable and complained of chest pain.  Patient is unable to characterize this pain.  At baseline, patient is described as pleasant with frequent laughing outbursts and occasional visual hallucinations.    Today, patient nods his head yes or no to questions but did not speak.  Denies any pain currently, no shortness of breath, no cough, no fevers or chills.  PMHx, Fam Hx, and/or Soc Hx : As per resident admit note  Vitals:   07/31/19 1145 07/31/19 1200  BP: (!) 141/98 (!) 128/94  Pulse: 79 86  Resp: (!) 22 (!) 24  Temp:    SpO2: 99% 100%   General: Awake, alert, only nods head yes or no to questions CVs: Regular rate and rhythm, normal heart sounds Lungs: CTA bilaterally Abdomen: Soft, nontender, nondistended, normoactive bowel sounds Extremities: No edema noted, nontender to palpation Psych: Mood appears normal Skin: Warm and dry Neuro: Awake, alert, unable to assess orientation  Assessment and Plan: I have seen and evaluated the patient as outlined above. I agree with the formulated Assessment and Plan as detailed in the residents' note, with the following changes:    1.  Acute blood loss anemia secondary to likely GI source: -Patient presented to the ED with chest pain x1 episode had the nursing facility where he lives.  He was found to be anemic with hemoglobin of 5.8.  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  He was also found to have diverticulosis on colonoscopy at that time.  Patient has no evidence of active bleed currently. -Patient is status post 2 units PRBC with appropriate rise in hemoglobin -We will monitor CBC daily -Continue with IV pantoprazole 40 mg twice daily -We will hold patient's home Plavix at this time -We will follow up GI recommendations.  I suspect patient will need repeat EGD and possible colonoscopy.  However, if he remains stable with no further episodes of bleeding this may be able to be done as an outpatient.  Will discuss with GI. -IV iron given secondary to iron deficiency anemia -I suspect that his chest pain is secondary to demand ischemia from his underlying anemia.  Troponins are only mildly elevated and EKG with no acute ST/T wave changes.  No further work-up at this time.  2.  COVID-19 pneumonia: -Patient was noted to be Covid positive at his nursing facility on January 7.  Chest x-ray done here showed developing pneumonia likely secondary to his underlying Covid -Continue with remdesivir -Patient with O2 sats in the 90s on room air.  We will hold off on Decadron at this point -Patient noted to have  an elevated D-dimer and CRP.  We will need to monitor closely.  3.  AKI: -Patient noted to have elevated creatinine up to 1.9 on admission from baseline of 1.4.  Patient had decreased p.o. intake for the last 5 to 7 days and I suspect this is prerenal in nature. -Patient also noted to be hyponatremic at 150 on admission. -We will continue D5W for now.  Creatinine has improved to 1.7 and sodium is improved  to 148. -We will monitor BMP daily -No further work-up at this time  4.  Hypertension, multiple prior CVAs: -Blood pressures well controlled currently.  Lisinopril on hold secondary to his AKI.  His Plavix is on hold as well secondary to his GI bleed. -We will continue with atorvastatin 40 mg daily. -No further work-up at this time  Karl Miller, pt's brother, is his guardian. Contact information [phone]  I spoke with patient's brother today and given update regarding patient's condition.  I explained the plan and he is in agreement with current course.  He asked when patient be able to return back to his nursing facility but otherwise had no other questions at this time.  Aldine Contes, MD 1/10/20211:29 PM

## 2019-08-01 DIAGNOSIS — K31811 Angiodysplasia of stomach and duodenum with bleeding: Principal | ICD-10-CM

## 2019-08-01 DIAGNOSIS — Z7902 Long term (current) use of antithrombotics/antiplatelets: Secondary | ICD-10-CM

## 2019-08-01 DIAGNOSIS — U071 COVID-19: Secondary | ICD-10-CM

## 2019-08-01 DIAGNOSIS — J1282 Pneumonia due to coronavirus disease 2019: Secondary | ICD-10-CM

## 2019-08-01 DIAGNOSIS — D500 Iron deficiency anemia secondary to blood loss (chronic): Secondary | ICD-10-CM

## 2019-08-01 DIAGNOSIS — D62 Acute posthemorrhagic anemia: Secondary | ICD-10-CM

## 2019-08-01 LAB — CBC WITH DIFFERENTIAL/PLATELET
Abs Immature Granulocytes: 0.03 10*3/uL (ref 0.00–0.07)
Basophils Absolute: 0 10*3/uL (ref 0.0–0.1)
Basophils Relative: 0 %
Eosinophils Absolute: 0.1 10*3/uL (ref 0.0–0.5)
Eosinophils Relative: 1 %
HCT: 32.9 % — ABNORMAL LOW (ref 39.0–52.0)
Hemoglobin: 9.2 g/dL — ABNORMAL LOW (ref 13.0–17.0)
Immature Granulocytes: 1 %
Lymphocytes Relative: 14 %
Lymphs Abs: 0.8 10*3/uL (ref 0.7–4.0)
MCH: 21.2 pg — ABNORMAL LOW (ref 26.0–34.0)
MCHC: 28 g/dL — ABNORMAL LOW (ref 30.0–36.0)
MCV: 76 fL — ABNORMAL LOW (ref 80.0–100.0)
Monocytes Absolute: 0.4 10*3/uL (ref 0.1–1.0)
Monocytes Relative: 7 %
Neutro Abs: 4.8 10*3/uL (ref 1.7–7.7)
Neutrophils Relative %: 77 %
Platelets: 176 10*3/uL (ref 150–400)
RBC: 4.33 MIL/uL (ref 4.22–5.81)
RDW: 24.6 % — ABNORMAL HIGH (ref 11.5–15.5)
WBC: 6.1 10*3/uL (ref 4.0–10.5)
nRBC: 0.3 % — ABNORMAL HIGH (ref 0.0–0.2)

## 2019-08-01 LAB — COMPREHENSIVE METABOLIC PANEL
ALT: 22 U/L (ref 0–44)
AST: 46 U/L — ABNORMAL HIGH (ref 15–41)
Albumin: 2.8 g/dL — ABNORMAL LOW (ref 3.5–5.0)
Alkaline Phosphatase: 41 U/L (ref 38–126)
Anion gap: 12 (ref 5–15)
BUN: 42 mg/dL — ABNORMAL HIGH (ref 8–23)
CO2: 25 mmol/L (ref 22–32)
Calcium: 10.3 mg/dL (ref 8.9–10.3)
Chloride: 113 mmol/L — ABNORMAL HIGH (ref 98–111)
Creatinine, Ser: 1.81 mg/dL — ABNORMAL HIGH (ref 0.61–1.24)
GFR calc Af Amer: 44 mL/min — ABNORMAL LOW (ref >60)
GFR calc non Af Amer: 38 mL/min — ABNORMAL LOW (ref >60)
Glucose, Bld: 82 mg/dL (ref 70–99)
Potassium: 4.1 mmol/L (ref 3.5–5.1)
Sodium: 150 mmol/L — ABNORMAL HIGH (ref 135–145)
Total Bilirubin: 0.7 mg/dL (ref 0.3–1.2)
Total Protein: 7.3 g/dL (ref 6.5–8.1)

## 2019-08-01 MED ORDER — SODIUM CHLORIDE 0.9 % IV SOLN
510.0000 mg | Freq: Once | INTRAVENOUS | Status: AC
  Administered 2019-08-03: 510 mg via INTRAVENOUS
  Filled 2019-08-01: qty 17

## 2019-08-01 MED ORDER — DEXTROSE 5 % IV SOLN: INTRAVENOUS | Status: AC

## 2019-08-01 NOTE — Progress Notes (Signed)
Date: 08/01/2019  Patient name: Karl Miller  Medical record number: [id number]  Date of birth: July 24, 1955   Subjective: Patient nods his head yes or no to questions.  Denies any pain and nods yes when asked if he feels well.  Patient still has a poor appetite and did not have much with breakfast and when asked if he was hungry shook his head no.  Objective: Vitals:   08/01/19 0818 08/01/19 0955  BP:  (!) 133/94  Pulse:    Resp:  20  Temp: 98.3 F (36.8 C)   SpO2:     General: Awake, alert, unable to assess orientation, NAD CVS: Regular rate and rhythm, normal heart sounds Lungs: CTA bilaterally Abdomen: Soft, nontender, nondistended, normoactive bowel sounds Extremities: No edema noted, nontender to palpation  Assessment/plan:  1.  Acute blood loss anemia secondary to likely GI source: -Patient presented to the ED with chest pain x1 episode had the nursing facility where he lives.  He was found to be anemic with hemoglobin of 5.8.  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  He was also found to have diverticulosis on colonoscopy at that time.  Patient has no evidence of active bleed currently. -Patient is status post 2 units PRBC on admission with appropriate rise in hemoglobin.  Patient's hemoglobin stable at 9.2 today -We will monitor CBC daily -Continue with IV pantoprazole 40 mg twice daily -We will hold patient's home Plavix at this time -IV iron given secondary to iron deficiency anemia -We will await GI recommendations.  I suspect that the patient will likely require an EGD/colonoscopy.  However, he has no active bleed at this point and his hemoglobin appears stable.  Would consider having this done as an outpatient given his active Covid status. -I suspect that his chest pain is secondary to demand ischemia from his underlying anemia.  Troponins are only  mildly elevated and EKG with no acute ST/T wave changes.  No further work-up at this time.  2.  COVID-19 pneumonia: -Patient was noted to be Covid positive at his nursing facility on January 7.  Chest x-ray done here showed developing pneumonia likely secondary to his underlying Covid -Continue with remdesivir day 3 -Patient with O2 sats in the mid to late 90s on room air.  We will hold off on Decadron at this point -Patient noted to have an elevated D-dimer and CRP.  We will need to monitor closely.  3.  AKI: -Patient noted to have elevated creatinine up to 1.9 on admission from baseline of 1.4.  Patient had decreased p.o. intake for the last 5 to 7 days and I suspect this is prerenal in nature. -Patient also noted to be hypernatremic at 150 on admission. -Creatinine mildly worse today at 1.81 and patient sodium is back up to 150.  I suspect the patient is not taking in enough fluids.  We will continue with D5W at 150 cc/h for now. -We will monitor BMP daily.  If his sodium and creatinine continue to worsen would consider increasing fluid rate. -No further work-up at this time  4.  Hypertension, multiple prior CVAs: -Blood pressures well controlled currently.  Lisinopril on hold secondary to his AKI.  His Plavix is on hold as well secondary to his GI bleed. -We will continue with atorvastatin 40 mg daily. -No further work-up at this time  Cindie Laroche, pt's  brother, is his guardian. Contact information [phone]  I spoke with his brother yesterday and he is in agreement with plan.  Aldine Contes, MD 08/01/2019, 10:51 AM

## 2019-08-01 NOTE — Progress Notes (Signed)
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  Labs: Recent Labs    07/30/19 1028 07/30/19 1200 07/31/19 0322 08/01/19 0322  WBC 5.6 5.2 6.4 6.1  HGB 5.8* 6.1* 8.3* 9.2*  HCT 23.1* 24.2* 29.4* 32.9*  PLT 191 192 161 176  CREATININE 1.90*  --  1.75* 1.81*  ALBUMIN  --   --  2.8* 2.8*  PROT  --   --  6.6 7.3  AST  --   --  42* 46*  ALT  --   --  21 22  ALKPHOS  --   --  31* 41  BILITOT  --   --  0.8 0.7   CrCl cannot be calculated (Unknown ideal weight.).   Microbiology: Recent Results (from the past 720 hour(s))  Blood Culture (routine x 2)     Status: None (Preliminary result)   Collection Time: 07/30/19 11:59 AM   Specimen: BLOOD  Result Value Ref Range Status   Specimen Description BLOOD RIGHT ANTECUBITAL  Final   Special Requests   Final    BOTTLES DRAWN AEROBIC AND ANAEROBIC Blood Culture adequate volume   Culture   Final    NO GROWTH 2 DAYS Performed at [facility], [address], [place], [place] [address]    Report Status PENDING  Incomplete  Blood Culture (routine x 2)     Status: None (Preliminary result)   Collection Time: 07/30/19  3:45 PM   Specimen: BLOOD  Result Value Ref Range Status   Specimen Description BLOOD RIGHT ANTECUBITAL  Final   Special Requests   Final    BOTTLES DRAWN AEROBIC AND ANAEROBIC Blood Culture results may not be optimal due to an inadequate volume of blood received in culture bottles   Culture   Final    NO GROWTH 2 DAYS Performed at [facility], [place] [address] [place], [place] [address]    Report  Status PENDING  Incomplete  Respiratory Panel by RT PCR (Flu A&B, Covid) - Nasopharyngeal Swab     Status: Abnormal   Collection Time: 07/30/19  8:35 PM   Specimen: Nasopharyngeal Swab  Result Value Ref Range Status   SARS Coronavirus 2 by RT PCR POSITIVE (A) NEGATIVE Final    Comment: RESULT CALLED TO, READ BACK BY AND VERIFIED WITH: RN WILL WALLACE AT 2142 BY MESSAN HOUEGNIFIO ON 07/30/2019 (NOTE) SARS-CoV-2 target nucleic acids are DETECTED. SARS-CoV-2 RNA is generally detectable in upper respiratory specimens  during the acute phase of infection. Positive results are indicative of the presence of the identified virus, but do not rule out bacterial infection or co-infection with other pathogens not detected by the test. Clinical correlation with patient history and other diagnostic information is necessary to determine patient infection status. The expected result is Negative. Fact Sheet for Patients:  PinkCheek.be Fact Sheet for Healthcare Providers: GravelBags.it This test is not yet approved or cleared by the [place] FDA and  has been authorized for detection and/or diagnosis of SARS-CoV-2 by FDA under an Emergency Use Authorization (EUA).  This EUA will remain in effect (meaning  thi s test can be used) for the duration of  the COVID-19 declaration under Section 564(b)(1) of the Act, 21 U.S.C. section 360bbb-3(b)(1), unless the authorization is terminated or revoked sooner.    Influenza A by PCR NEGATIVE NEGATIVE Final   Influenza B by PCR NEGATIVE NEGATIVE Final    Comment: (NOTE) The Xpert Xpress SARS-CoV-2/FLU/RSV assay is intended as an aid in  the diagnosis of influenza from Nasopharyngeal swab specimens and  should not be used as a sole basis for treatment. Nasal washings and  aspirates are unacceptable for Xpert Xpress SARS-CoV-2/FLU/RSV  testing. Fact Sheet for  Patients: PinkCheek.be Fact Sheet for Healthcare Providers: GravelBags.it This test is not yet approved or cleared by the [place] FDA and  has been authorized for detection and/or diagnosis of SARS-CoV-2 by  FDA under an Emergency Use Authorization (EUA). This EUA will remain  in effect (meaning this test can be used) for the duration of the  Covid-19 declaration under Section 564(b)(1) of the Act, 21  U.S.C. section 360bbb-3(b)(1), unless the authorization is  terminated or revoked. Performed at [facility], [place] [address], [place], [place] [address]     Medical History: Past Medical History:  Diagnosis Date  . Acute encephalopathy 12/03/2017   [name] 12/04/2017  . High cholesterol   . Hypertension   . Stroke ([place])   . TIA (transient ischemic attack) 12/03/2017   [name] 12/04/2017    Assessment: 64 year old male admitted from facility with acute blood loss anemia secondary to GI source, acute kidney injury, and noted to be COVID positive. Pharmacy was consulted by GI today to replace iron with IV iron.   Hgb 9.2, Plts 176.  Iron panel 1/9 : Iron 12, Sats 4, Ferritin 40.   Per ordering and administration records, patient received Feraheme 510 mg IV x 1 dose on 07/30/09 which is the maximum amount per dose. Patient can be re-dosed in 3-8 days.   Goal of Therapy:  Serum Iron of 40, Hgb 14  Plan:  Feraheme 510mg  IV x1 - to be given 08/03/19 (3 days since last dose).  Monitor CBC, iron panel as needed.  Pharmacy will sign off, please re-consult if needed.   Sloan Leiter, PharmD, BCPS, BCCCP Clinical Pharmacist Please refer to [facility] for [place] numbers 08/01/2019,3:54 PM

## 2019-08-01 NOTE — Plan of Care (Signed)

## 2019-08-01 NOTE — Consult Note (Signed)
[facility] Consult: 12:11 PM 08/01/2019  LOS: 2 days    Referring Provider: Dr Dareen Piano from teaching service.    Primary Care Physician:  Administration, Hotel manager:  [name].  Seen inpt by [facility] in 10/2018 and 01/2019 Brother is power of attorney.  Keland Higashi [phone]    Reason for Consultation:  Acute on chronic anemia, FOBT +   HPI: Karl Miller is a 64 y.o. male.  PMH Vascular and Alzheimer's dementia.  Recurrent CVAs.  Chronic Plavix.  Spinal mass, followed by Dr. Arnoldo Morale.  Hypertension. S/p loop recorder insertion.   Hyperlipidemia.  Chronic anemia with transfusions on 3 occasions in 2020. 1 PRBC 11/18/2018, 1 PRBC 12/03/2018, 2 PRBCs in 01/2019.   SNF resident/total care at [facility].  COVID-19 positive on 07/28/2019 but not on contact precautions.  02/11/2019 colonoscopy, Dr Hilarie Fredrickson.  For FOBT positive IDA.  Poor colon prep.  Limited visualization with no visualization of ascending colon or cecum.  2 small polyps in the descending and transverse colon, resection not attempted.  Diverticulosis in sigmoid, descending, transverse colon. 02/11/2019 EGD.  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  One gastric AVM was actively bleeding and injected with epinephrine, APC and clipped.  A second, nonbleeding gastric AVM was treated with APC and clips.  The 2 smallest AVMs were treated with epinephrine injection and APC.  All duodenal AVMs nonbleeding and were treated with APC. Patient received 2 doses of Feraheme, 2 PRBCs in 01/2019.  Plavix was restarted.    Brought to the ED 3 days ago with chest pain, weakness, tachycardia. Hb 5.8, previous 10.4 on 08/15/2018.  9.2 after 2 PRBCs on 1/10.   MCV low at 76.  Iron low at 12, iron sats low at 4%, ferritin 40. AKI: 44/1.9.   Hyponatremic at 150.  CRP elevated.   Opponents mildly elevated, no acute/ischemic EKG changes and felt to have demand ischemia as source for chest pain CXR shows ill-defined right base opacity, likely developing pneumonia.  No bloody, no melenic stools.  No abdominal pain.  No nausea vomiting. Day 3 remdesivir, no Decadron..  O2 sats in the mid to high 90s on room air   Past Medical History:  Diagnosis Date  . Acute encephalopathy 12/03/2017   [name] 12/04/2017  . High cholesterol   . Hypertension   . Stroke ([place])   . TIA (transient ischemic attack) 12/03/2017   [name] 12/04/2017    Past Surgical History:  Procedure Laterality Date  . COLONOSCOPY WITH PROPOFOL N/A 02/11/2019   Procedure: COLONOSCOPY WITH PROPOFOL;  Surgeon: [name], MD;  Location: [place];  Service: Gastroenterology;  Laterality: N/A;  . ESOPHAGOGASTRODUODENOSCOPY (EGD) WITH PROPOFOL N/A 02/11/2019   Procedure: ESOPHAGOGASTRODUODENOSCOPY (EGD) WITH PROPOFOL;  Surgeon: [name], MD;  Location: [facility] ENDOSCOPY;  Service: Gastroenterology;  Laterality: N/A;  . HEMOSTASIS CLIP PLACEMENT  02/11/2019   Procedure: HEMOSTASIS CLIP PLACEMENT;  Surgeon: [name], MD;  Location: [facility] ENDOSCOPY;  Service: Gastroenterology;;  . HEMOSTASIS CONTROL  02/11/2019  Procedure: HEMOSTASIS CONTROL;  Surgeon: [name], MD;  Location: [facility] ENDOSCOPY;  Service: Gastroenterology;;  . HOT HEMOSTASIS N/A 02/11/2019   Procedure: HOT HEMOSTASIS (ARGON PLASMA COAGULATION/BICAP);  Surgeon: [name], MD;  Location: [facility] ENDOSCOPY;  Service: Gastroenterology;  Laterality: N/A;  . LOOP RECORDER INSERTION N/A 12/07/2017   Procedure: LOOP RECORDER INSERTION;  Surgeon: Constance Haw, MD;  Location: [place] CV LAB;  Service: Cardiovascular;  Laterality: N/A;  . NO PAST SURGERIES    . TEE WITHOUT CARDIOVERSION N/A 12/07/2017   Procedure: TRANSESOPHAGEAL ECHOCARDIOGRAM (TEE);  Surgeon: Dorothy Spark, MD;  Location: [facility]  ENDOSCOPY;  Service: Cardiovascular;  Laterality: N/A;    Prior to Admission medications   Medication Sig Start Date End Date Taking? Authorizing Provider  acetaminophen (TYLENOL) 500 MG tablet Take 500-1,000 mg by mouth every 8 (eight) hours as needed for mild pain.    Yes [provider]  alum & mag hydroxide-simeth (MYLANTA) 200-200-20 MG/5ML suspension Take 30 mLs by mouth as needed for indigestion or heartburn.   Yes [provider]  atorvastatin (LIPITOR) 10 MG tablet Take 1 tablet (10 mg total) by mouth daily at 6 PM. Patient taking differently: Take 40 mg by mouth daily.  11/19/18  Yes Meccariello, Bernita Raisin, DO  clopidogrel (PLAVIX) 75 MG tablet Take 75 mg by mouth daily.   Yes [provider]  donepezil (ARICEPT) 10 MG tablet Take 10 mg by mouth every morning.   Yes [provider]  ferrous sulfate 325 (65 FE) MG tablet Take 325 mg by mouth 3 (three) times a week. Mon, Wed, Fri   Yes [provider]  finasteride (PROSCAR) 5 MG tablet Take 5 mg by mouth daily.   Yes [provider]  guaiFENesin (ROBITUSSIN) 100 MG/5ML SOLN Take 10 mLs by mouth every 4 (four) hours as needed for cough or to loosen phlegm.   Yes [provider]  lisinopril (ZESTRIL) 10 MG tablet Take 1 tablet (10 mg total) by mouth daily. 02/15/19 07/30/19 Yes Madalyn Rob, MD  loperamide (IMODIUM) 2 MG capsule Take 2 mg by mouth as needed for diarrhea or loose stools (not to exceed 8 doses in 24hrs).   Yes [provider]  magnesium hydroxide (MILK OF MAGNESIA) 400 MG/5ML suspension Take 30 mLs by mouth at bedtime as needed for mild constipation.    Yes [provider]  neomycin-bacitracin-polymyxin (NEOSPORIN) OINT Apply 1 application topically as needed for wound care.   Yes [provider]  pantoprazole (PROTONIX) 40 MG tablet Take 40 mg by mouth daily.   Yes [provider]  Vitamin D, Ergocalciferol, (DRISDOL) 1.25 MG (50000  UT) CAPS capsule Take 50,000 Units by mouth every 7 (seven) days. Wednesday   Yes [provider]    Scheduled Meds: . atorvastatin  40 mg Oral Daily  . donepezil  10 mg Oral BH-q7a  . finasteride  5 mg Oral Daily  . pantoprazole (PROTONIX) IV  40 mg Intravenous Q12H   Infusions: . dextrose 150 mL/hr at 08/01/19 0755  . remdesivir 100 mg in NS 100 mL 100 mg (08/01/19 0959)   PRN Meds: acetaminophen, guaiFENesin, neomycin-bacitracin-polymyxin   Allergies as of 07/30/2019  . (No Known Allergies)    No family history on file.  Social History   Socioeconomic History  . Marital status: Single    Spouse name: Not on file  . Number of children: Not on file  . Years of education: Not on file  .  Highest education level: Not on file  Occupational History  . Not on file  Tobacco Use  . Smoking status: Current Every Day Smoker    Packs/day: 0.50    Years: 24.00    Pack years: 12.00    Types: Cigarettes  . Smokeless tobacco: Never Used  Substance and Sexual Activity  . Alcohol use: No  . Drug use: No  . Sexual activity: Not Currently  Other Topics Concern  . Not on file  Social History Narrative  . Not on file   Social Determinants of Health   Financial Resource Strain:   . Difficulty of Paying Living Expenses: Not on file  Food Insecurity:   . Worried About Charity fundraiser in the Last Year: Not on file  . Ran Out of Food in the Last Year: Not on file  Transportation Needs:   . Lack of Transportation (Medical): Not on file  . Lack of Transportation (Non-Medical): Not on file  Physical Activity:   . Days of Exercise per Week: Not on file  . Minutes of Exercise per Session: Not on file  Stress:   . Feeling of Stress : Not on file  Social Connections:   . Frequency of Communication with Friends and Family: Not on file  . Frequency of Social Gatherings with Friends and Family: Not on file  . Attends Religious Services: Not on file  . Active Member of  Clubs or Organizations: Not on file  . Attends Archivist Meetings: Not on file  . Marital Status: Not on file  Intimate Partner Violence:   . Fear of Current or Ex-Partner: Not on file  . Emotionally Abused: Not on file  . Physically Abused: Not on file  . Sexually Abused: Not on file    REVIEW OF SYSTEMS: Did not speak with patient.  He is unable to provide much history with his dementia.   PHYSICAL EXAM: Vital signs in last 24 hours: Vitals:   08/01/19 0818 08/01/19 0955  BP:  (!) 133/94  Pulse:    Resp:  20  Temp: 98.3 F (36.8 C)   SpO2:     Wt Readings from Last 3 Encounters:  02/11/19 77.1 kg  12/03/18 77.1 kg  11/19/18 74 kg   Did not examine patient.  Intake/Output from previous day: 01/10 0701 - 01/11 0700 In: 100 [IV Piggyback:100] Out: -  Intake/Output this shift: No intake/output data recorded.  LAB RESULTS: Recent Labs    07/30/19 1200 07/31/19 0322 08/01/19 0322  WBC 5.2 6.4 6.1  HGB 6.1* 8.3* 9.2*  HCT 24.2* 29.4* 32.9*  PLT 192 161 176   BMET Lab Results  Component Value Date   NA 150 (H) 08/01/2019   NA 148 (H) 07/31/2019   NA 150 (H) 07/30/2019   K 4.1 08/01/2019   K 3.9 07/31/2019   K 4.1 07/30/2019   CL 113 (H) 08/01/2019   CL 112 (H) 07/31/2019   CL 114 (H) 07/30/2019   CO2 25 08/01/2019   CO2 23 07/31/2019   CO2 25 07/30/2019   GLUCOSE 82 08/01/2019   GLUCOSE 120 (H) 07/31/2019   GLUCOSE 140 (H) 07/30/2019   BUN 42 (H) 08/01/2019   BUN 36 (H) 07/31/2019   BUN 44 (H) 07/30/2019   CREATININE 1.81 (H) 08/01/2019   CREATININE 1.75 (H) 07/31/2019   CREATININE 1.90 (H) 07/30/2019   CALCIUM 10.3 08/01/2019   CALCIUM 9.6 07/31/2019   CALCIUM 9.9 07/30/2019   LFT Recent Labs  07/31/19 0322 08/01/19 0322  PROT 6.6 7.3  ALBUMIN 2.8* 2.8*  AST 42* 46*  ALT 21 22  ALKPHOS 31* 41  BILITOT 0.8 0.7   PT/INR Lab Results  Component Value Date   INR 1.1 02/08/2019   INR 1.1 11/15/2018    Drugs of Abuse      Component Value Date/Time   LABOPIA NONE DETECTED 02/08/2019 1309   COCAINSCRNUR NONE DETECTED 02/08/2019 1309   LABBENZ NONE DETECTED 02/08/2019 1309   AMPHETMU NONE DETECTED 02/08/2019 1309   THCU NONE DETECTED 02/08/2019 1309   LABBARB NONE DETECTED 02/08/2019 1309     RADIOLOGY STUDIES: No results found.    IMPRESSION:   *    Recurrent acute on chronic anemia.  Macrocytic.  FOBT positive.  No overt bleeding.  On 4 separate occasions in the last 9 months has received PRBCs for acute on chronic anemia.  Received Feraheme twice in July 2020 and again yesterday 07/31/2019  *    Dementia, Alzheimer's and vascular type.  History of multiple CVAs, on chronic Plavix. Latest: lacunar infarcts in 10/2018. Last Plavix was 1/9, now on hold.     PLAN:     *    Although he has had multiple CVAs, and both vascular and Alzheimer's dementia, time to revisit the risk-benefit of ongoing Plavix given recurrent episodes of transfusion requiring anemia and GI bleeds over the past 9 months.  *   Repeat EGD vs enteroscopy? Will need consent from pt's brother.    *   On Protonix 40 IV bid, inclined to switch to oral.  Home dose is 40/day   Azucena Freed  08/01/2019, 12:11 PM Phone [phone]

## 2019-08-02 DIAGNOSIS — K922 Gastrointestinal hemorrhage, unspecified: Secondary | ICD-10-CM

## 2019-08-02 LAB — COMPREHENSIVE METABOLIC PANEL
ALT: 22 U/L (ref 0–44)
AST: 41 U/L (ref 15–41)
Albumin: 2.5 g/dL — ABNORMAL LOW (ref 3.5–5.0)
Alkaline Phosphatase: 28 U/L — ABNORMAL LOW (ref 38–126)
Anion gap: 9 (ref 5–15)
BUN: 32 mg/dL — ABNORMAL HIGH (ref 8–23)
CO2: 24 mmol/L (ref 22–32)
Calcium: 9.5 mg/dL (ref 8.9–10.3)
Chloride: 108 mmol/L (ref 98–111)
Creatinine, Ser: 1.58 mg/dL — ABNORMAL HIGH (ref 0.61–1.24)
GFR calc Af Amer: 52 mL/min — ABNORMAL LOW (ref >60)
GFR calc non Af Amer: 45 mL/min — ABNORMAL LOW (ref >60)
Glucose, Bld: 135 mg/dL — ABNORMAL HIGH (ref 70–99)
Potassium: 4.1 mmol/L (ref 3.5–5.1)
Sodium: 141 mmol/L (ref 135–145)
Total Bilirubin: 0.6 mg/dL (ref 0.3–1.2)
Total Protein: 6.3 g/dL — ABNORMAL LOW (ref 6.5–8.1)

## 2019-08-02 LAB — CBC WITH DIFFERENTIAL/PLATELET
Abs Immature Granulocytes: 0 10*3/uL (ref 0.00–0.07)
Basophils Absolute: 0 10*3/uL (ref 0.0–0.1)
Basophils Relative: 0 %
Eosinophils Absolute: 0 10*3/uL (ref 0.0–0.5)
Eosinophils Relative: 0 %
HCT: 32 % — ABNORMAL LOW (ref 39.0–52.0)
Hemoglobin: 9 g/dL — ABNORMAL LOW (ref 13.0–17.0)
Lymphocytes Relative: 4 %
Lymphs Abs: 0.3 10*3/uL — ABNORMAL LOW (ref 0.7–4.0)
MCH: 21.5 pg — ABNORMAL LOW (ref 26.0–34.0)
MCHC: 28.1 g/dL — ABNORMAL LOW (ref 30.0–36.0)
MCV: 76.6 fL — ABNORMAL LOW (ref 80.0–100.0)
Monocytes Absolute: 0.3 10*3/uL (ref 0.1–1.0)
Monocytes Relative: 4 %
Neutro Abs: 5.8 10*3/uL (ref 1.7–7.7)
Neutrophils Relative %: 92 %
Platelets: 174 10*3/uL (ref 150–400)
RBC: 4.18 MIL/uL — ABNORMAL LOW (ref 4.22–5.81)
RDW: 25.3 % — ABNORMAL HIGH (ref 11.5–15.5)
WBC: 6.3 10*3/uL (ref 4.0–10.5)
nRBC: 0 /100 WBC
nRBC: 0.3 % — ABNORMAL HIGH (ref 0.0–0.2)

## 2019-08-02 MED ORDER — PANTOPRAZOLE SODIUM 40 MG PO TBEC
40.0000 mg | DELAYED_RELEASE_TABLET | Freq: Every day | ORAL | Status: DC
  Administered 2019-08-03 – 2019-08-08 (×5): 40 mg via ORAL
  Filled 2019-08-02 (×6): qty 1

## 2019-08-02 NOTE — Progress Notes (Addendum)
Subjective: Patient not has had an answer to questions.  He denied any pain currently.  Was not hungry and his appetite remains poor.  Objective:  Vital signs in last 24 hours: Vitals:   08/01/19 0955 08/01/19 1740 08/02/19 0604 08/02/19 0700  BP: (!) 133/94 128/90 (!) 139/103 136/89  Pulse:  79 72 67  Resp: 20 20    Temp:  99.2 F (37.3 C) 98.7 F (37.1 C) 98.4 F (36.9 C)  TempSrc:  Oral Oral Oral  SpO2:  100% 100% 100%   General: Awake, alert, nods and shakes his head appropriately in response to questions.  Noted to have had a bowel movement while in bed. CVs: Regular rate and rhythm, normal heart sounds Lungs: CTA bilaterally Abdomen: Soft, nontender, nondistended, normoactive bowel sounds Extremities: No edema noted, nontender to palpation  Assessment/Plan:  Active Problems:   Acute on chronic blood loss anemia  1.Acute blood loss anemia secondary to likely GI source: -Patient presented to the ED with chest pain x1 episode had the nursing facility where he lives. He was found to be anemic with hemoglobin of 5.8. 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 He was also found to have diverticulosis on colonoscopy at that time. Patient has no evidence of active bleed currently. -Patient is status post 2 units PRBC on admission with appropriate rise in hemoglobin.  Patient's hemoglobin stable at 9 today -We will monitor CBC daily -Continue with Protonix 40 mg daily -We will continue to hold patient's home Plavix at this time.  Patient has a history of recurrent CVAs but has also had his second admission for GI bleed while on his Plavix.  It is a difficult decision as to whether we can resume this medication given his recurrent GI bleed as we have to balance his risk of CVAs as well.  Dr. Hilarie Fredrickson and I discussed this yesterday.  We will at least continue to  hold his Plavix for now in the setting of an acute GI bleed and will revisit this discussion based on his findings on EGD/enteroscopy. -GI follow-up and recommendations appreciated.  Patient to be scheduled for small bowel enteroscopy on Thursday -IV iron given secondary to iron deficiency anemia.  2.COVID-19 pneumonia: -Patient was noted to be Covid positive at his nursing facility on January 7. Chest x-ray done here showed developing pneumonia likely secondary to his underlying Covid -Continue with remdesivir day 4 -Patient with O2 sats in the  90s to 100 on room air. We will hold off on Decadron at this point  3.AKI: -Patient noted to have elevated creatinine up to 1.9 on admission from baseline of 1.4. Patient had decreased p.o. intake for the last 5 to 7 days and I suspect this is prerenal in nature. -Patient also noted to be hypernatremic at 150 on admission. -Creatinine has now improved to 1.58 today and his sodium has normalized to 141. -We will decrease his rate of D5W to 100 cc/h from 150. -We will monitor BMP daily.  -No further work-up at this time  4.Hypertension, multiple prior CVAs: -Blood pressures well controlled currently. Lisinopril on hold secondary to his AKI. His Plavix is on hold as well secondary to his GI bleed. -We will continue with atorvastatin 40 mg daily. -No further work-up at this time  Cindie Laroche, pt's brother, is his guardian. Contact information [phone].  Dr.  Bloomfield called the patient's brother today to discuss current plan with him and he is in agreement.  Dispo: Anticipated discharge in approximately 2 to 3 days.   Aldine Contes, MD 08/02/2019, 3:12 PM

## 2019-08-02 NOTE — Progress Notes (Signed)
Called to update patient's brother and POA, [name]. Also discussed the difficult situation Karl Miller is in requiring chronic Plavix for multiple CVA history, but now having his second admission for GI bleeding. He states he does not have the medical expertise to make this decision and would like to proceed based on what his physicians think will give his brother the best chance longterm.

## 2019-08-02 NOTE — Progress Notes (Signed)
          Daily Rounding Note  08/02/2019, 10:30 AM  LOS: 3 days   SUBJECTIVE:   Chief complaint:  Anemia.  FOBT +   Hypertensive to 130s/103. RN says no BM's, nausea, abd pain, respiratory distress.  Tolerating soft diet sats 100% on RA.    OBJECTIVE:         Vital signs in last 24 hours:    Temp:  [98.4 F (36.9 C)-99.2 F (37.3 C)] 98.4 F (36.9 C) (01/12 0700) Pulse Rate:  [67-79] 67 (01/12 0700) Resp:  [20] 20 (01/11 1740) BP: (128-139)/(89-103) 136/89 (01/12 0700) SpO2:  [100 %] 100 % (01/12 0700)   There were no vitals filed for this visit.  Not examined.    Intake/Output from previous day: 01/11 0701 - 01/12 0700 In: -  Out: 400 [Urine:400]  Intake/Output this shift: No intake/output data recorded.  Lab Results: Recent Labs    07/31/19 0322 08/01/19 0322 08/02/19 0608  WBC 6.4 6.1 6.3  HGB 8.3* 9.2* 9.0*  HCT 29.4* 32.9* 32.0*  PLT 161 176 174   BMET Recent Labs    07/31/19 0322 08/01/19 0322 08/02/19 0608  NA 148* 150* 141  K 3.9 4.1 4.1  CL 112* 113* 108  CO2 23 25 24   GLUCOSE 120* 82 135*  BUN 36* 42* 32*  CREATININE 1.75* 1.81* 1.58*  CALCIUM 9.6 10.3 9.5   LFT Recent Labs    07/31/19 0322 08/01/19 0322 08/02/19 0608  PROT 6.6 7.3 6.3*  ALBUMIN 2.8* 2.8* 2.5*  AST 42* 46* 41  ALT 21 22 22   ALKPHOS 31* 41 28*  BILITOT 0.8 0.7 0.6   Scheduled Meds: . atorvastatin  40 mg Oral Daily  . donepezil  10 mg Oral BH-q7a  . finasteride  5 mg Oral Daily  . pantoprazole (PROTONIX) IV  40 mg Intravenous Q12H   Continuous Infusions: . dextrose 150 mL/hr at 08/02/19 0501  . [START ON 08/03/2019] ferumoxytol    . remdesivir 100 mg in NS 100 mL 100 mg (08/02/19 0916)   PRN Meds:.acetaminophen, guaiFENesin, neomycin-bacitracin-polymyxin   ASSESMENT:   *   Recurrent acute on chronic anemia.  FOBT positive.  Macrocytic.  No overt bleeding. Has received Feraheme, PRBCs previously.  AVMs,  one bleeding, treated with epi, APC, clipped on EGD in 01/2019.   Received 2 PRBCs 1/10, Feraheme 1/10. On BID IV Protonix.    *    Chronic Plavix for history of multiple CVAs.  Last dose 1/9, on hold. Medical team debating whether Plavix should be permanently discontinued. Hgb improved, stable.    *   Covid 19 +.  Remdesivir day 3.  No respiratory distress.     PLAN   *   Switch Protonix to po.  CBC in AM  *   Possible enteroscopy on 1/14.      Karl Miller  08/02/2019, 10:30 AM Phone [phone]

## 2019-08-03 LAB — COMPREHENSIVE METABOLIC PANEL
ALT: 26 U/L (ref 0–44)
AST: 43 U/L — ABNORMAL HIGH (ref 15–41)
Albumin: 2.5 g/dL — ABNORMAL LOW (ref 3.5–5.0)
Alkaline Phosphatase: 31 U/L — ABNORMAL LOW (ref 38–126)
Anion gap: 10 (ref 5–15)
BUN: 24 mg/dL — ABNORMAL HIGH (ref 8–23)
CO2: 22 mmol/L (ref 22–32)
Calcium: 9.2 mg/dL (ref 8.9–10.3)
Chloride: 102 mmol/L (ref 98–111)
Creatinine, Ser: 1.19 mg/dL (ref 0.61–1.24)
GFR calc Af Amer: >60 mL/min (ref >60)
GFR calc non Af Amer: >60 mL/min (ref >60)
Glucose, Bld: 109 mg/dL — ABNORMAL HIGH (ref 70–99)
Potassium: 3.4 mmol/L — ABNORMAL LOW (ref 3.5–5.1)
Sodium: 134 mmol/L — ABNORMAL LOW (ref 135–145)
Total Bilirubin: 0.7 mg/dL (ref 0.3–1.2)
Total Protein: 6.4 g/dL — ABNORMAL LOW (ref 6.5–8.1)

## 2019-08-03 LAB — TYPE AND SCREEN
ABO/RH(D): O NEG
Antibody Screen: POSITIVE
DAT, IgG: NEGATIVE
Donor AG Type: NEGATIVE
Donor AG Type: NEGATIVE
Donor AG Type: NEGATIVE
Donor AG Type: NEGATIVE
PT AG Type: NEGATIVE
Unit division: 0
Unit division: 0
Unit division: 0
Unit division: 0

## 2019-08-03 LAB — CBC WITH DIFFERENTIAL/PLATELET
Abs Immature Granulocytes: 0.03 10*3/uL (ref 0.00–0.07)
Basophils Absolute: 0 10*3/uL (ref 0.0–0.1)
Basophils Relative: 0 %
Eosinophils Absolute: 0.1 10*3/uL (ref 0.0–0.5)
Eosinophils Relative: 1 %
HCT: 31.7 % — ABNORMAL LOW (ref 39.0–52.0)
Hemoglobin: 9.2 g/dL — ABNORMAL LOW (ref 13.0–17.0)
Immature Granulocytes: 1 %
Lymphocytes Relative: 10 %
Lymphs Abs: 0.6 10*3/uL — ABNORMAL LOW (ref 0.7–4.0)
MCH: 21.2 pg — ABNORMAL LOW (ref 26.0–34.0)
MCHC: 29 g/dL — ABNORMAL LOW (ref 30.0–36.0)
MCV: 73 fL — ABNORMAL LOW (ref 80.0–100.0)
Monocytes Absolute: 0.6 10*3/uL (ref 0.1–1.0)
Monocytes Relative: 10 %
Neutro Abs: 4.6 10*3/uL (ref 1.7–7.7)
Neutrophils Relative %: 78 %
Platelets: 182 10*3/uL (ref 150–400)
RBC: 4.34 MIL/uL (ref 4.22–5.81)
RDW: 25.2 % — ABNORMAL HIGH (ref 11.5–15.5)
WBC: 5.9 10*3/uL (ref 4.0–10.5)
nRBC: 0.3 % — ABNORMAL HIGH (ref 0.0–0.2)

## 2019-08-03 LAB — BPAM RBC
Blood Product Expiration Date: 202101292359
Blood Product Expiration Date: 202101302359
Blood Product Expiration Date: 202101312359
Blood Product Expiration Date: 202101312359
ISSUE DATE / TIME: 202101091708
ISSUE DATE / TIME: 202101092130
Unit Type and Rh: 9500
Unit Type and Rh: 9500
Unit Type and Rh: 9500
Unit Type and Rh: 9500

## 2019-08-03 NOTE — Progress Notes (Signed)
Attempted to call pt brother [name] to try and get consent for endoscopy tomorrow but not answer. Will try again later.

## 2019-08-03 NOTE — Progress Notes (Addendum)
Still no decision re: restarting chronic Plavix.  No resp distress related to Covid infection.  Remdesivir continues.    Stools are brown.  Tolerating soft diet.     BPs 140s/low 100s.  HR in 80s.   Hgb stable.    Scheduled Meds: . atorvastatin  40 mg Oral Daily  . donepezil  10 mg Oral BH-q7a  . finasteride  5 mg Oral Daily  . pantoprazole  40 mg Oral Q0600   Continuous Infusions: . ferumoxytol    . remdesivir 100 mg in NS 100 mL 100 mg (08/02/19 0916)   PRN Meds:.acetaminophen, guaiFENesin, neomycin-bacitracin-polymyxin   Received IV iron 1/10 and 2nd dose today.  2 PRBCs transfused initially.  Plavix on hold.   For enteroscopy tmrw.   Pt's brother will need to provide consent  Azucena Freed PA-C.

## 2019-08-03 NOTE — Progress Notes (Signed)
     Subjective: Patient is nonverbal but does nod and shakes his head in response to questions.  He denies any pain and overall feels well.  No shortness of breath.  He does follow commands.  Objective:  Vital signs in last 24 hours: Vitals:   08/02/19 2309 08/02/19 2338 08/03/19 0925 08/03/19 1100  BP: (!) 146/104  (!) 138/99   Pulse: 82 82 74   Resp:   (!) 21   Temp: 99.5 F (37.5 C)  98.6 F (37 C)   TempSrc: Oral  Oral   SpO2:  100% 99%   Weight:    68.4 kg   General: Awake, alert, NAD CVs: Regular rate and rhythm, normal heart sounds Lungs: CTA bilaterally Abdomen: Soft, nontender, nondistended, normoactive bowel sounds Extremities: No edema noted, nontender to palpation  Assessment/Plan:  Active Problems:   Acute on chronic blood loss anemia  1.Acute blood loss anemia secondary to likely GI source: -Patient presented to the ED with chest pain x1 episode had the nursing facility where he lives. He was found to be anemic with hemoglobin of 5.8. 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 He was also found to have diverticulosis on colonoscopy at that time. Patient has no evidence of active bleed currently. -Patient is status post 2 units PRBCon admissionwith appropriate rise in hemoglobin. Patient's hemoglobin stable at 9.2 today -We will monitor CBC daily -Continue with Protonix 40 mg daily -We will continue to hold patient's home Plavix at this time.  We will need to discuss with patient's brother after small bowel enteroscopy tomorrow as to the risks and benefits of continuing Plavix.  It is a difficult decision given patient's recurrent CVAs as well as risk from recurrent GI bleeds.  I hope that the procedure tomorrow will help clarify some of the risks. -GI follow-up and recommendations appreciated.  Patient to be scheduled for small bowel  enteroscopy tomorrow -IV iron given today secondary to iron deficiency anemia.  2.COVID-19 pneumonia: -Patient was noted to be Covid positive at his nursing facility on January 7. Chest x-ray done here showed developing pneumonia likely secondary to his underlying Covid -Continue with remdesivirday 5 -Patient with O2 sats in the 90s to 100 on room air. We will hold off on Decadron at this point  3.AKI: -Patient noted to have elevated creatinine up to 1.9 on admission from baseline of 1.4. Patient had decreased p.o. intake for the last 5 to 7 days and I suspect this is prerenal in nature. -Patient also noted to behypernatremicat 150 on admission. -Creatinine has now normalized and his sodium is now 134. -We will DC his IV fluids and monitor -We will monitor BMP daily.  -No further work-up at this time  4.Hypertension, multiple prior CVAs: -Blood pressures well controlled currently. Lisinopril was on hold secondary to his AKI.  Will consider restarting tomorrow after his procedure now that his AKI has resolved.  -His Plavix is on hold as well secondary to his GI bleed. -We will continue with atorvastatin 40 mg daily. -No further work-up at this time  Dispo: Anticipated discharge in approximately 2-3 day(s).   Aldine Contes, MD 08/03/2019, 3:26 PM Pager: @[email or web address] @

## 2019-08-04 ENCOUNTER — Inpatient Hospital Stay (HOSPITAL_COMMUNITY): Payer: Medicare Other | Admitting: Anesthesiology

## 2019-08-04 ENCOUNTER — Encounter (HOSPITAL_COMMUNITY)
Admission: EM | Disposition: A | Discharge status: Skilled Nursing Facility | Payer: Self-pay | Source: Home / Self Care | Attending: Internal Medicine

## 2019-08-04 DIAGNOSIS — K31819 Angiodysplasia of stomach and duodenum without bleeding: Secondary | ICD-10-CM

## 2019-08-04 DIAGNOSIS — K5521 Angiodysplasia of colon with hemorrhage: Secondary | ICD-10-CM

## 2019-08-04 HISTORY — PX: HOT HEMOSTASIS: SHX5433

## 2019-08-04 HISTORY — PX: ENTEROSCOPY: SHX5533

## 2019-08-04 LAB — COMPREHENSIVE METABOLIC PANEL
ALT: 30 U/L (ref 0–44)
AST: 45 U/L — ABNORMAL HIGH (ref 15–41)
Albumin: 2.4 g/dL — ABNORMAL LOW (ref 3.5–5.0)
Alkaline Phosphatase: 38 U/L (ref 38–126)
Anion gap: 9 (ref 5–15)
BUN: 23 mg/dL (ref 8–23)
CO2: 23 mmol/L (ref 22–32)
Calcium: 9.3 mg/dL (ref 8.9–10.3)
Chloride: 105 mmol/L (ref 98–111)
Creatinine, Ser: 1.19 mg/dL (ref 0.61–1.24)
GFR calc Af Amer: >60 mL/min (ref >60)
GFR calc non Af Amer: >60 mL/min (ref >60)
Glucose, Bld: 86 mg/dL (ref 70–99)
Potassium: 4 mmol/L (ref 3.5–5.1)
Sodium: 137 mmol/L (ref 135–145)
Total Bilirubin: 0.9 mg/dL (ref 0.3–1.2)
Total Protein: 6.2 g/dL — ABNORMAL LOW (ref 6.5–8.1)

## 2019-08-04 LAB — CBC WITH DIFFERENTIAL/PLATELET
Abs Immature Granulocytes: 0.06 10*3/uL (ref 0.00–0.07)
Basophils Absolute: 0 10*3/uL (ref 0.0–0.1)
Basophils Relative: 0 %
Eosinophils Absolute: 0.1 10*3/uL (ref 0.0–0.5)
Eosinophils Relative: 1 %
HCT: 31.6 % — ABNORMAL LOW (ref 39.0–52.0)
Hemoglobin: 9.2 g/dL — ABNORMAL LOW (ref 13.0–17.0)
Immature Granulocytes: 1 %
Lymphocytes Relative: 9 %
Lymphs Abs: 0.8 10*3/uL (ref 0.7–4.0)
MCH: 21.4 pg — ABNORMAL LOW (ref 26.0–34.0)
MCHC: 29.1 g/dL — ABNORMAL LOW (ref 30.0–36.0)
MCV: 73.5 fL — ABNORMAL LOW (ref 80.0–100.0)
Monocytes Absolute: 0.8 10*3/uL (ref 0.1–1.0)
Monocytes Relative: 10 %
Neutro Abs: 6.9 10*3/uL (ref 1.7–7.7)
Neutrophils Relative %: 79 %
Platelets: 220 10*3/uL (ref 150–400)
RBC: 4.3 MIL/uL (ref 4.22–5.81)
RDW: 25.7 % — ABNORMAL HIGH (ref 11.5–15.5)
WBC: 8.7 10*3/uL (ref 4.0–10.5)
nRBC: 0.2 % (ref 0.0–0.2)

## 2019-08-04 LAB — CULTURE, BLOOD (ROUTINE X 2)
Culture: NO GROWTH
Special Requests: ADEQUATE

## 2019-08-04 LAB — URINALYSIS, ROUTINE W REFLEX MICROSCOPIC
Bilirubin Urine: NEGATIVE
Glucose, UA: NEGATIVE mg/dL
Hgb urine dipstick: NEGATIVE
Ketones, ur: NEGATIVE mg/dL
Leukocytes,Ua: NEGATIVE
Nitrite: NEGATIVE
Protein, ur: >=300 mg/dL — AB
Specific Gravity, Urine: 1.024 (ref 1.005–1.030)
pH: 9 — ABNORMAL HIGH (ref 5.0–8.0)

## 2019-08-04 SURGERY — ENTEROSCOPY
Anesthesia: General

## 2019-08-04 MED ORDER — LIDOCAINE 2% (20 MG/ML) 5 ML SYRINGE
INTRAMUSCULAR | Status: DC | PRN
  Administered 2019-08-04: 100 mg via INTRAVENOUS

## 2019-08-04 MED ORDER — PHENYLEPHRINE 40 MCG/ML (10ML) SYRINGE FOR IV PUSH (FOR BLOOD PRESSURE SUPPORT)
PREFILLED_SYRINGE | INTRAVENOUS | Status: DC | PRN
  Administered 2019-08-04 (×2): 200 ug via INTRAVENOUS
  Administered 2019-08-04: 160 ug via INTRAVENOUS

## 2019-08-04 MED ORDER — EPHEDRINE SULFATE-NACL 50-0.9 MG/10ML-% IV SOSY
PREFILLED_SYRINGE | INTRAVENOUS | Status: DC | PRN
  Administered 2019-08-04: 15 mg via INTRAVENOUS

## 2019-08-04 MED ORDER — SODIUM CHLORIDE 0.9 % IV SOLN: INTRAVENOUS | Status: DC | PRN

## 2019-08-04 MED ORDER — DEXAMETHASONE SODIUM PHOSPHATE 10 MG/ML IJ SOLN
INTRAMUSCULAR | Status: DC | PRN
  Administered 2019-08-04: 10 mg via INTRAVENOUS

## 2019-08-04 MED ORDER — PROPOFOL 10 MG/ML IV BOLUS
INTRAVENOUS | Status: DC | PRN
  Administered 2019-08-04: 170 mg via INTRAVENOUS

## 2019-08-04 MED ORDER — PHENYLEPHRINE HCL-NACL 10-0.9 MG/250ML-% IV SOLN
INTRAVENOUS | Status: DC | PRN
  Administered 2019-08-04: 50 ug/min via INTRAVENOUS

## 2019-08-04 MED ORDER — ROCURONIUM BROMIDE 50 MG/5ML IV SOSY
PREFILLED_SYRINGE | INTRAVENOUS | Status: DC | PRN
  Administered 2019-08-04: 140 mg via INTRAVENOUS

## 2019-08-04 MED ORDER — ONDANSETRON HCL 4 MG/2ML IJ SOLN
INTRAMUSCULAR | Status: DC | PRN
  Administered 2019-08-04: 4 mg via INTRAVENOUS

## 2019-08-04 MED ORDER — FENTANYL CITRATE (PF) 100 MCG/2ML IJ SOLN
INTRAMUSCULAR | Status: AC
  Filled 2019-08-04: qty 2

## 2019-08-04 MED ORDER — FENTANYL CITRATE (PF) 100 MCG/2ML IJ SOLN
INTRAMUSCULAR | Status: DC | PRN
  Administered 2019-08-04: 50 ug via INTRAVENOUS

## 2019-08-04 NOTE — Progress Notes (Signed)
Called patient brother [name] back with no answer. Will try again in little bit.

## 2019-08-04 NOTE — Progress Notes (Signed)
     Subjective: Patient denies any pain at this time.  Patient denies shortness of breath as well.  Patient follows commands.  Objective:  Vital signs in last 24 hours: Vitals:   08/04/19 1412 08/04/19 1515 08/04/19 1525 08/04/19 1530  BP: (!) 143/97 115/84 108/75 112/82  Pulse: 79 83 83 87  Resp:  18 18 18   Temp: 98 F (36.7 C) (!) 97.3 F (36.3 C)    TempSrc: Oral Oral    SpO2: 92% 100% 96% 97%  Weight:       General: Awake, alert, NAD CVS: Regular rate and rhythm, normal heart sounds Lungs: CTA bilaterally Abdomen: Soft, nontender, nondistended, normoactive bowel sounds Extremities: No edema noted, nontender to palpation  Assessment/Plan:  Active Problems:   Acute on chronic blood loss anemia  1.Acute blood loss anemia secondary to likely GI source: -Patient presented to the ED with chest pain x1 episode had the nursing facility where he lives. He was found to be anemic with hemoglobin of 5.8. 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 He was also found to have diverticulosis on colonoscopy at that time. Patient has no evidence of active bleed currently. -Patient is status post 2 units PRBCon admissionwith appropriate rise in hemoglobin. Patient's hemoglobin stable at9.2today -We will monitor CBC daily -Continue withProtonix 40 mg daily -We willcontinue tohold patient's home Plavix at this time.  We will need to discuss with patient's brother after small bowel enteroscopy today as to the risks and benefits of continuing Plavix.  It is a difficult decision given patient's recurrent CVAs as well as risk from recurrent GI bleeds.  -GI follow-up and recommendations appreciated. Patient to undergo small bowel enteroscopy today -IV iron given on this admission secondary to his iron deficiency anemia  2.COVID-19 pneumonia: -Patient was noted to  be Covid positive at his nursing facility on January 7. Chest x-ray done here showed developing pneumonia likely secondary to his underlying Covid -We will DC remdesivir as patient has completed 5-day course. -Patient with O2 sats in the 90son room air. We will hold off on Decadron at this point  3.AKI likely prerenal: -Patient noted to have elevated creatinine up to 1.9 on admission from baseline of 1.4. Patient had decreased p.o. intake for the last 5 to 7 days and I suspect this is prerenal in nature. -Patient also noted to behypernatremicat 150 on admission. -Creatinine and sodium has now normalized. -We will  continue to monitor him off IV fluids -No further work-up at this time  4.Hypertension, multiple prior CVAs: -Blood pressures well controlled currently. Lisinopril was on hold secondary to his AKI.  Will consider restarting tomorrow now that his AKI has resolved.  -His Plavix is on hold as well secondary to his GI bleed. -We will continue with atorvastatin 40 mg daily. -No further work-up at this time  Dispo: Anticipated discharge in approximately 1-2 day(s).   Aldine Contes, MD 08/04/2019, 4:00 PM

## 2019-08-04 NOTE — Plan of Care (Signed)
  Problem: Clinical Measurements: Goal: Cardiovascular complication will be avoided Outcome: Progressing   Problem: Pain Managment: Goal: General experience of comfort will improve Outcome: Progressing   Problem: Safety: Goal: Ability to remain free from injury will improve Outcome: Progressing   

## 2019-08-04 NOTE — Progress Notes (Signed)
PHARMACY - PHYSICIAN COMMUNICATION CRITICAL VALUE ALERT - BLOOD CULTURE IDENTIFICATION (BCID)  Karl Miller is an 64 y.o. male who presented to [facility] on 07/30/2019 with a chief complaint of GI bleed  Assessment:  1/2 blood cultures from 1/9 growing Gram positive rods.  Likely contaminant.    Name of physician (or Provider) Contacted:  Dr. Myrtie Hawk  Current antibiotics: None  Changes to prescribed antibiotics recommended:   No changes at this time.    No results found for this or any previous visit.  Caryl Pina 08/04/2019  1:15 AM

## 2019-08-04 NOTE — Op Note (Signed)
[facility] Patient Name: Karl Miller Procedure Date : 08/04/2019 MRN: [id number] Attending MD: [name] , MD Date of Birth: [birth year] CSN: [id number] Age: 64 Admit Type: Inpatient Procedure:                Small bowel enteroscopy Indications:              Melena, Arteriovenous malformation in the stomach,                            Arteriovenous malformation in the small intestine                            previously treated, iron deficiency anemia Providers:                Lajuan Lines. Hilarie Fredrickson, MD, Angus Seller, William Dalton,                            Technician Referring MD:             Internal Medicine Teaching Service Medicines:                Monitored Anesthesia Care Complications:            No immediate complications. Estimated Blood Loss:     Estimated blood loss was minimal. Procedure:                Pre-Anesthesia Assessment:                           - Prior to the procedure, a History and Physical                            was performed, and patient medications and                            allergies were reviewed. The patient's tolerance of                            previous anesthesia was also reviewed. The risks                            and benefits of the procedure and the sedation                            options and risks were discussed with the patient.                            All questions were answered, and informed consent                            was obtained. Prior Anticoagulants: The patient has                            taken Plavix (clopidogrel), last dose was 5 days  prior to procedure. ASA Grade Assessment: III - A                            patient with severe systemic disease. After                            reviewing the risks and benefits, the patient was                            deemed in satisfactory condition to undergo the                            procedure.      After obtaining informed consent, the endoscope was                            passed under direct vision. Throughout the                            procedure, the patient's blood pressure, pulse, and                            oxygen saturations were monitored continuously. The                            [id number] Olympus pediatric colonoscope                            was introduced through the mouth and advanced to                            the proximal jejunum. The small bowel enteroscopy                            was accomplished without difficulty. The patient                            tolerated the procedure well. Scope In: Scope Out: Findings:      The examined esophagus was normal.      An endoclip (previously placed in 2020) was found in the gastric body.       This was intact without bleeding and thus it was not removed.      The exam of the stomach was otherwise normal.      Six angioectasias with no active bleeding and typical arborization were       found in the duodenal bulb, in the second portion of the duodenum, in       the third portion of the duodenum and in the fourth portion of the       duodenum. Fulguration to ablate the lesion to prevent bleeding by argon       plasma at 0.5 liters/minute and 20 watts was successful.      Two angioectasias with no bleeding were found in the proximal jejunum.       Fulguration to ablate the lesion to prevent bleeding by argon plasma at  0.5 liters/minute and 20 watts was successful.      Exam of the duodenum and examined jejunum was otherwise normal. Impression:               - Normal esophagus.                           - An previously placed endoclip was found in the                            stomach.                           - Six non-bleeding angioectasias in the duodenum.                            Treated with argon plasma coagulation (APC).                           - Two non-bleeding  angioectasias in the jejunum.                            Treated with argon plasma coagulation (APC).                           - No specimens collected. Moderate Sedation:      N/A Recommendation:           - Return patient to hospital ward for ongoing care.                           - Advance diet as tolerated.                           - Would hold Plavix an additional 7 days after                            endoscopic therapy.                           - Complete IV iron therapy                           - Okay to resume Plavix given history and risk of                            recurrent CVA in 7 days. However, if he has                            recurrent chronic bleeding requiring transfusion,                            then I would recommend Plavix be discontinued. It                            is likely there are additional angioectasias in the  more distal bowel which was not examined today.                           - Hgb and iron studies should be monitored                            periodically as an outpatient. IV iron can be                            repeated in the future as needed.                           - GI will sign off for now, though remain available                            if questions. Procedure Code(s):        --- Professional ---                           [phone], Small intestinal endoscopy, enteroscopy                            beyond second portion of duodenum, not including                            ileum; with control of bleeding (eg, injection,                            bipolar cautery, unipolar cautery, laser, heater                            probe, stapler, plasma coagulator) Diagnosis Code(s):        --- Professional ---                           [id number], Foreign body in stomach, initial encounter                           K31.819, Angiodysplasia of stomach and duodenum                            without bleeding                            K55.20, Angiodysplasia of colon without hemorrhage                           K92.1, Melena (includes Hematochezia) CPT copyright 2019 American [employer] Association. All rights reserved. The codes documented in this report are preliminary and upon coder review may  be revised to meet current compliance requirements. [name], MD 08/04/2019 7:10:56 PM This report has been signed electronically. Number of Addenda: 0

## 2019-08-04 NOTE — Progress Notes (Signed)
Patient back from endoscopy, he has been sleeping, when RN checked on patient, on of his tooth was out on the bed on his left side. Patient still sleeping at this time. Will continue to monitor patient.

## 2019-08-04 NOTE — Transfer of Care (Signed)
Immediate Anesthesia Transfer of Care Note  Patient: Karl Miller  Procedure(s) Performed: ENTEROSCOPY (N/A ) HOT HEMOSTASIS (ARGON PLASMA COAGULATION/BICAP) (N/A )  Patient Location: Endoscopy Unit  Anesthesia Type:General  Level of Consciousness: drowsy and patient cooperative  Airway & Oxygen Therapy: Patient Spontanous Breathing and Patient connected to face mask oxygen  Post-op Assessment: Report given to RN and Post -op Vital signs reviewed and stable  Post vital signs: Reviewed and stable  Last Vitals:  Vitals Value Taken Time  BP    Temp    Pulse    Resp    SpO2      Last Pain:  Vitals:   08/04/19 1412  TempSrc: Oral  PainSc: 0-No pain         Complications: No apparent anesthesia complications

## 2019-08-04 NOTE — Anesthesia Preprocedure Evaluation (Signed)
Anesthesia Evaluation  Patient identified by MRN, date of birth, ID band Patient awake and Patient confused    Reviewed: Allergy & Precautions, NPO status , Patient's Chart, lab work & pertinent test results  History of Anesthesia Complications Negative for: history of anesthetic complications  Airway Mallampati: II  TM Distance: >3 FB Neck ROM: Full    Dental no notable dental hx.    Pulmonary Current Smoker,    Pulmonary exam normal        Cardiovascular hypertension, Normal cardiovascular exam     Neuro/Psych Dementia CVA    GI/Hepatic Neg liver ROS, GERD  Medicated and Controlled,GI bleed   Endo/Other  negative endocrine ROS  Renal/GU Renal InsufficiencyRenal disease (Cr 1.4)     Musculoskeletal negative musculoskeletal ROS (+)   Abdominal Normal abdominal exam  (+)   Peds  Hematology  (+) anemia , Hgb 10.5   Anesthesia Other Findings Day of surgery medications reviewed with the patient.  Reproductive/Obstetrics                             Anesthesia Physical  Anesthesia Plan  ASA: III  Anesthesia Plan: General   Post-op Pain Management:    Induction:   PONV Risk Score and Plan: Treatment may vary due to age or medical condition and Propofol infusion  Airway Management Planned: Natural Airway and Nasal Cannula  Additional Equipment: None  Intra-op Plan:   Post-operative Plan:   Informed Consent: I have reviewed the patients History and Physical, chart, labs and discussed the procedure including the risks, benefits and alternatives for the proposed anesthesia with the patient or authorized representative who has indicated his/her understanding and acceptance.     Dental advisory given and Consent reviewed with POA  Plan Discussed with: CRNA  Anesthesia Plan Comments:         Anesthesia Quick Evaluation

## 2019-08-04 NOTE — Progress Notes (Signed)
Patient urine was cloudy and had a foul smell. MD notified. Will continue to monitor patient.

## 2019-08-04 NOTE — Anesthesia Procedure Notes (Signed)
Procedure Name: Intubation Date/Time: 08/04/2019 2:20 PM Performed by: Lance Coon, CRNA Pre-anesthesia Checklist: Patient identified, Emergency Drugs available, Suction available, Patient being monitored and Timeout performed Patient Re-evaluated:Patient Re-evaluated prior to induction Oxygen Delivery Method: Circle system utilized Preoxygenation: Pre-oxygenation with 100% oxygen Induction Type: IV induction Laryngoscope Size: Glidescope and 4 Grade View: Grade I Tube type: Oral Tube size: 7.5 mm Number of attempts: 1 Airway Equipment and Method: Stylet and Video-laryngoscopy Placement Confirmation: ETT inserted through vocal cords under direct vision and positive ETCO2 Secured at: 22 cm Tube secured with: Tape

## 2019-08-04 NOTE — Progress Notes (Signed)
Attempted to contact brother [name] again this morning to obtain consent for endoscopy, there was no answer. Will have next shift attempt again.

## 2019-08-04 NOTE — Progress Notes (Signed)
Attempted to call patient's brother this morning to obtain consent for endoscopy, it went directly to voice mail. RN left a voice for [name] to call back for consent for his brother. Will continue to monitor patient.

## 2019-08-05 ENCOUNTER — Encounter: Payer: Self-pay | Admitting: Anesthesiology

## 2019-08-05 DIAGNOSIS — K5521 Angiodysplasia of colon with hemorrhage: Secondary | ICD-10-CM

## 2019-08-05 LAB — CBC
HCT: 32.7 % — ABNORMAL LOW (ref 39.0–52.0)
Hemoglobin: 9.2 g/dL — ABNORMAL LOW (ref 13.0–17.0)
MCH: 21.2 pg — ABNORMAL LOW (ref 26.0–34.0)
MCHC: 28.1 g/dL — ABNORMAL LOW (ref 30.0–36.0)
MCV: 75.5 fL — ABNORMAL LOW (ref 80.0–100.0)
Platelets: 264 10*3/uL (ref 150–400)
RBC: 4.33 MIL/uL (ref 4.22–5.81)
RDW: 26.6 % — ABNORMAL HIGH (ref 11.5–15.5)
WBC: 9.1 10*3/uL (ref 4.0–10.5)
nRBC: 0.2 % (ref 0.0–0.2)

## 2019-08-05 MED ORDER — FUROSEMIDE 10 MG/ML IJ SOLN
INTRAMUSCULAR | Status: AC
  Filled 2019-08-05: qty 4

## 2019-08-05 MED ORDER — DIPHENHYDRAMINE HCL 50 MG/ML IJ SOLN
INTRAMUSCULAR | Status: AC
  Filled 2019-08-05: qty 1

## 2019-08-05 NOTE — Evaluation (Signed)
Physical Therapy Evaluation Patient Details Name: Karl Miller MRN: [id number] DOB: 1956-01-18 Today's Date: 08/05/2019   History of Present Illness  Pt is a 64 y/o male with PMH of vascular dementia, Alzheimer's disease, CVA, hypertension, hyperlipidemia, and chronic anemia who presented to the emergency room today from [facility] for chest pain in the setting of COVID 19 infection. Found with low hemoglobin, likely to recurrent GI bleed. PMH: HTN, CVA, encephalopathy.   Clinical Impression  Pt admitted with above diagnosis. Pt needed +2 max assist to stand and with heavy posterior lean. Could not advance feet to walk due to all weight was on heels and max assist +2 for balance. Called Angel at [facility] and she states that pt cannot use a wheelchair on the Memory care unit he was on and that he has to be able to walk.  Pt +2 max assist currently therefore recommend SNF.  SW aware.   Pt currently with functional limitations due to the deficits listed below (see PT Problem List). Pt will benefit from skilled PT to increase their independence and safety with mobility to allow discharge to the venue listed below.     Follow Up Recommendations SNF;Supervision/Assistance - 24 hour    Equipment Recommendations  Other (comment)(TBA next venue)    Recommendations for Other Services       Precautions / Restrictions Precautions Precautions: Fall Restrictions Weight Bearing Restrictions: No      Mobility  Bed Mobility Overal bed mobility: Needs Assistance Bed Mobility: Supine to Sit;Sit to Supine     Supine to sit: Max assist;+2 for physical assistance;+2 for safety/equipment Sit to supine: Total assist;+2 for physical assistance;+2 for safety/equipment   General bed mobility comments: mulitmodal cueing and max assist -total assist +2 for all aspects of bed mobility; poor intiation, sequencing and motor planning   Transfers Overall transfer level: Needs assistance Equipment  used: Rolling walker (2 wheeled) Transfers: Sit to/from Stand Sit to Stand: Max assist;+2 physical assistance;+2 safety/equipment         General transfer comment: able to initate stand, but overall requires max assist +2 to complete full power up, steady with heavy posterior lean and leaning to left.  Pt had some BM on pad therefore stood long enough to clean pt.   Ambulation/Gait                Stairs            Wheelchair Mobility    Modified Rankin (Stroke Patients Only)       Balance Overall balance assessment: Needs assistance Sitting-balance support: No upper extremity supported;Feet supported Sitting balance-Leahy Scale: Poor Sitting balance - Comments: heavy L lateral and posterior lean, requring min to max assist as pt fatigues  Postural control: Posterior lean;Left lateral lean Standing balance support: Bilateral upper extremity supported;During functional activity Standing balance-Leahy Scale: Poor Standing balance comment: heavy posterior and L lateral lean requiring external support with RW                             Pertinent Vitals/Pain Pain Assessment: No/denies pain    Home Living Family/patient expects to be discharged to:: Assisted living               Home Equipment: Walker - 2 wheels Additional Comments: Spent incr time getting in touch with A living and spoke with [facility] who states that pt needs to be able to ambulate as he is on  the Memory care unit and they dont use wheelchairs on that floor.  Pt currently cannot walk as he is +2 max assist to stand.  He would need to use Wheelchair intiially therefore feel that SNF is best option.     Prior Function Level of Independence: Needs assistance   Gait / Transfers Assistance Needed: pt reports using RW for mobility and Glenard Haring confirms  ADL's / Homemaking Assistance Needed: reports some assist with ADLs and Glenard Haring confirms  Comments: Pt was on Memory care unit PTA and needs  to be able to ambulate on his own per staff     Hand Dominance   Dominant Hand: Right    Extremity/Trunk Assessment   Upper Extremity Assessment Upper Extremity Assessment: Defer to OT evaluation    Lower Extremity Assessment Lower Extremity Assessment: Generalized weakness    Cervical / Trunk Assessment Cervical / Trunk Assessment: Kyphotic  Communication   Communication: No difficulties  Cognition Arousal/Alertness: Awake/alert Behavior During Therapy: Flat affect Overall Cognitive Status: History of cognitive impairments - at baseline                                 General Comments: patient following simple 1 step commands inconsistently, oriented to self, place (given choices), with decreased awareness to safety; limited verbalizations during session      General Comments      Exercises     Assessment/Plan    PT Assessment Patient needs continued PT services  PT Problem List Decreased activity tolerance;Decreased balance;Decreased mobility;Decreased strength;Decreased range of motion;Decreased knowledge of use of DME;Decreased safety awareness;Decreased knowledge of precautions;Cardiopulmonary status limiting activity       PT Treatment Interventions DME instruction;Gait training;Functional mobility training;Therapeutic activities;Therapeutic exercise;Balance training;Patient/family education    PT Goals (Current goals can be found in the Care Plan section)  Acute Rehab PT Goals Patient Stated Goal: none stated PT Goal Formulation: With patient Time For Goal Achievement: 08/19/19 Potential to Achieve Goals: Good    Frequency Min 2X/week   Barriers to discharge Decreased caregiver support      Co-evaluation PT/OT/SLP Co-Evaluation/Treatment: Yes Reason for Co-Treatment: Complexity of the patient's impairments (multi-system involvement);For patient/therapist safety PT goals addressed during session: Mobility/safety with mobility          AM-PAC PT "6 Clicks" Mobility  Outcome Measure Help needed turning from your back to your side while in a flat bed without using bedrails?: A Lot Help needed moving from lying on your back to sitting on the side of a flat bed without using bedrails?: Total Help needed moving to and from a bed to a chair (including a wheelchair)?: Total Help needed standing up from a chair using your arms (e.g., wheelchair or bedside chair)?: Total Help needed to walk in hospital room?: Total Help needed climbing 3-5 steps with a railing? : Total 6 Click Score: 7    End of Session Equipment Utilized During Treatment: Gait belt Activity Tolerance: Patient limited by fatigue Patient left: in bed;with call bell/phone within reach;with bed alarm set Nurse Communication: Mobility status PT Visit Diagnosis: Unsteadiness on feet (R26.81);Muscle weakness (generalized) (M62.81)    Time: [id number] PT Time Calculation (min) (ACUTE ONLY): 12 min   Charges:   PT Evaluation $PT Eval Moderate Complexity: 1 Mod          Keryl Gholson W,PT Acute Rehabilitation Services Pager:  [phone]  Office:  [place] 08/05/2019, 3:36 PM

## 2019-08-05 NOTE — Anesthesia Postprocedure Evaluation (Signed)
Anesthesia Post Note  Patient: Karl Miller  Procedure(s) Performed: ENTEROSCOPY (N/A ) HOT HEMOSTASIS (ARGON PLASMA COAGULATION/BICAP) (N/A )     Patient location during evaluation: Endoscopy Anesthesia Type: General Level of consciousness: awake and alert Pain management: pain level controlled Vital Signs Assessment: post-procedure vital signs reviewed and stable Respiratory status: spontaneous breathing, nonlabored ventilation, respiratory function stable and patient connected to nasal cannula oxygen Cardiovascular status: blood pressure returned to baseline and stable Postop Assessment: no apparent nausea or vomiting Anesthetic complications: no    Last Vitals:  Vitals:   08/04/19 2139 08/05/19 0615  BP: 119/90 (!) 130/95  Pulse: 72 60  Resp: 16 18  Temp: 36.9 C 36.7 C  SpO2: 94% 94%    Last Pain:  Vitals:   08/05/19 0615  TempSrc: Oral  PainSc: 0-No pain                 Secret Kristensen COKER

## 2019-08-05 NOTE — TOC Progression Note (Signed)
Transition of Care [facility]) - Progression Note    Patient Details  Name: Weaver Secrist MRN: [id number] Date of Birth: 12-13-1955  [facility]) CM/SW Contact  Bartholomew Crews, RN Phone Number: [phone] 08/05/2019, 4:00 PM  Clinical Narrative:    Spoke with [facility] - unable to accept patient d/t potential for long term care. Noted [facility] offered bed. [facility] can accept patient on Monday, latest Tuesday and will assist with long term care placement if needed. Spoke with brother, [name], who is in agreement. Notified [facility] of accepted bed offer. TOC team following.    Expected Discharge Plan: Assisted Living Barriers to Discharge: Continued Medical Work up  Expected Discharge Plan and Services Expected Discharge Plan: Assisted Living   Discharge Planning Services: CM Consult   Living arrangements for the past 2 months: Assisted Living Facility                                       Social Determinants of Health (SDOH) Interventions    Readmission Risk Interventions No flowsheet data found.

## 2019-08-05 NOTE — Evaluation (Signed)
Occupational Therapy Evaluation Patient Details Name: Karl Miller MRN: [id number] DOB: 1956-05-15 Today's Date: 08/05/2019    History of Present Illness Pt is a 64 y/o male with PMH of vascular dementia, Alzheimer's disease, CVA, hypertension, hyperlipidemia, and chronic anemia who presented to the emergency room today from [facility] for chest pain in the setting of COVID 19 infection. Found with low hemoglobin, likely to recurrent GI bleed. PMH: HTN, CVA, encephalopathy.    Clinical Impression   Patient admitted for above and limited by problem list below, including impaired cognition, generalized weakness, impaired balance (posterior and L lateral lean), decreased activity tolerance.  Patient oriented to self, place (with choices), follows 1 step commands inconsistently with increased time and has poor problem solving, awareness and attention to tasks. He requires max assist +2 for bed mobility and transfers, max-total assist for all self care at this time.  He will benefit from continued OT services while admitted and after dc at SNF level in order to optimize independence with ADLs, mobility to return to PLOF.      Follow Up Recommendations  SNF;Supervision/Assistance - 24 hour    Equipment Recommendations  Other (comment)(TBD at next venue of care)    Recommendations for Other Services       Precautions / Restrictions Precautions Precautions: Fall Restrictions Weight Bearing Restrictions: No      Mobility Bed Mobility Overal bed mobility: Needs Assistance Bed Mobility: Supine to Sit;Sit to Supine     Supine to sit: Max assist;+2 for physical assistance;+2 for safety/equipment Sit to supine: Total assist;+2 for physical assistance;+2 for safety/equipment   General bed mobility comments: mulitmodal cueing and max assist -total assist +2 for all aspects of bed mobility; poor intiation, sequencing and motor planning   Transfers Overall transfer level: Needs  assistance Equipment used: Rolling walker (2 wheeled) Transfers: Sit to/from Stand Sit to Stand: Max assist;+2 physical assistance;+2 safety/equipment         General transfer comment: able to initate stand, but overall requires max assist +2 to complete full power up, steady with heavy posterior lean     Balance Overall balance assessment: Needs assistance Sitting-balance support: No upper extremity supported;Feet supported Sitting balance-Leahy Scale: Poor Sitting balance - Comments: heavy L lateral and posterior lean, requring min to max assist as pt fatigues  Postural control: Posterior lean;Left lateral lean Standing balance support: Bilateral upper extremity supported;During functional activity Standing balance-Leahy Scale: Poor Standing balance comment: heavy posterior and L lateral lean requiring external support                           ADL either performed or assessed with clinical judgement   ADL Overall ADL's : Needs assistance/impaired                                     Functional mobility during ADLs: Maximal assistance;+2 for physical assistance;+2 for safety/equipment;Rolling walker General ADL Comments: total assist for all self care at this time d/t cognition, balance, weakness      Vision   Additional Comments: further assessment, limited by cognition     Perception     Praxis      Pertinent Vitals/Pain Pain Assessment: No/denies pain     Hand Dominance     Extremity/Trunk Assessment Upper Extremity Assessment Upper Extremity Assessment: Generalized weakness   Lower Extremity Assessment Lower Extremity Assessment: Defer to  PT evaluation       Communication Communication Communication: No difficulties   Cognition Arousal/Alertness: Awake/alert Behavior During Therapy: Flat affect Overall Cognitive Status: History of cognitive impairments - at baseline                                 General  Comments: patient following simple 1 step commands inconsistently, oriented to self, place (given choices), with decreased awareness to safety; limited verbalizations during session   General Comments       Exercises     Shoulder Instructions      Home Living Family/patient expects to be discharged to:: Assisted living                             Home Equipment: Walker - 2 wheels   Additional Comments: Spent incr time getting in touch with A living and spoke with Karl Miller who states that pt       Prior Functioning/Environment Level of Independence: Needs assistance  Gait / Transfers Assistance Needed: reports using RW for mobility  ADL's / Homemaking Assistance Needed: reports some assist with ADLs    Comments: poor historian         OT Problem List: Decreased strength;Decreased activity tolerance;Impaired balance (sitting and/or standing);Decreased coordination;Decreased cognition;Decreased safety awareness;Decreased knowledge of use of DME or AE;Decreased knowledge of precautions      OT Treatment/Interventions: Self-care/ADL training;Energy conservation;DME and/or AE instruction;Therapeutic activities;Balance training;Patient/family education;Cognitive remediation/compensation;Therapeutic exercise;Neuromuscular education    OT Goals(Current goals can be found in the care plan section) Acute Rehab OT Goals Patient Stated Goal: none stated Time For Goal Achievement: 08/19/19 Potential to Achieve Goals: Fair  OT Frequency: Min 2X/week   Barriers to D/C:            Co-evaluation              AM-PAC OT "6 Clicks" Daily Activity     Outcome Measure Help from another person eating meals?: A Lot Help from another person taking care of personal grooming?: Total Help from another person toileting, which includes using toliet, bedpan, or urinal?: Total Help from another person bathing (including washing, rinsing, drying)?: Total Help from another person to  put on and taking off regular upper body clothing?: Total Help from another person to put on and taking off regular lower body clothing?: Total 6 Click Score: 7   End of Session Equipment Utilized During Treatment: Gait belt;Rolling walker Nurse Communication: Mobility status  Activity Tolerance: Patient tolerated treatment well Patient left: in bed;with call bell/phone within reach;with bed alarm set  OT Visit Diagnosis: Other abnormalities of gait and mobility (R26.89);Muscle weakness (generalized) (M62.81);Other symptoms and signs involving cognitive function;Other symptoms and signs involving the nervous system (R29.898)                Time: [id number] OT Time Calculation (min): 12 min Charges:  OT General Charges $OT Visit: 1 Visit OT Evaluation $OT Eval Moderate Complexity: 1 Mod  Jolaine Artist, OT Acute Rehabilitation Services Pager [phone] Office [phone]   Delight Stare 08/05/2019, 3:09 PM

## 2019-08-05 NOTE — Discharge Summary (Signed)
Name: Karl Miller MRN: [id number] DOB: 01-11-1956 64 y.o. PCP: [facility]  Date of Admission: 07/30/2019  9:59 AM Date of Discharge: 08/05/2019 Attending Physician: Bartholomew Crews, MD  Discharge Diagnosis: Active Problems:   Acute on chronic blood loss anemia   Angiodysplasia of intestine with hemorrhage  Discharge Medications: Allergies as of 08/08/2019   No Known Allergies     Medication List    STOP taking these medications   clopidogrel 75 MG tablet Commonly known as: PLAVIX     TAKE these medications   acetaminophen 500 MG tablet Commonly known as: TYLENOL Take 500-1,000 mg by mouth every 8 (eight) hours as needed for mild pain.   atorvastatin 10 MG tablet Commonly known as: LIPITOR Take 4 tablets (40 mg total) by mouth daily.   donepezil 10 MG tablet Commonly known as: ARICEPT Take 10 mg by mouth every morning.   ferrous sulfate 325 (65 FE) MG tablet Take 325 mg by mouth 3 (three) times a week. Mon, Wed, Fri   finasteride 5 MG tablet Commonly known as: PROSCAR Take 5 mg by mouth daily.   guaiFENesin 100 MG/5ML Soln Commonly known as: ROBITUSSIN Take 10 mLs by mouth every 4 (four) hours as needed for cough or to loosen phlegm.   lisinopril 10 MG tablet Commonly known as: ZESTRIL Take 1 tablet (10 mg total) by mouth daily.   loperamide 2 MG capsule Commonly known as: IMODIUM Take 2 mg by mouth as needed for diarrhea or loose stools (not to exceed 8 doses in 24hrs).   magnesium hydroxide 400 MG/5ML suspension Commonly known as: MILK OF MAGNESIA Take 30 mLs by mouth at bedtime as needed for mild constipation.   Mylanta 200-200-20 MG/5ML suspension Generic drug: alum & mag hydroxide-simeth Take 30 mLs by mouth as needed for indigestion or heartburn.   neomycin-bacitracin-polymyxin Oint Commonly known as: NEOSPORIN Apply 1 application topically as needed for wound care.   pantoprazole 40 MG tablet Commonly known as:  PROTONIX Take 40 mg by mouth daily.   Vitamin D (Ergocalciferol) 1.25 MG (50000 UNIT) Caps capsule Commonly known as: DRISDOL Take 50,000 Units by mouth every 7 (seven) days. Wednesday       Disposition and follow-up:   Karl Miller was discharged from [facility] in Stable condition.  At the hospital follow up visit please address:  1.  Acute on chronic blood loss anemia - secondary to recurrent upper GI bleed on chronic plavix - given 2 units pRBCs and 2 IV iron transfusions - Hgb stable at 9.2 before discharge - hold plavix for 7 days (until 08/12/2019) - if GI bleeding recurs, would hold plavix indefinitely   Fe deficiency anemia - given two IV iron transfusions - follow-up blood work to ensure response  COVID-19 - no documented episodes of hypoxia - treated with 5 day course of remdesivir   2.  Labs / imaging needed at time of follow-up: f/u CBC and iron studies, BMP  3.  Pending labs/ test needing follow-up: NONE  Follow-up Appointments: Contact information for after-discharge care    [facility] .   Service: Skilled Nursing Contact information: [address] [place] [facility] Course by problem list: 1. Karl Miller is a 64 year old M with a significant PMH of vascular dementia, Alzheimer's disease, CVA, hypertension, hyperlipidemia, and chronic anemia who presented  for chest pain in the setting of a COVID-19 infection and was found to have a Hgb of 5.8.    Acute on chronic blood loss anemia Recurrent upper GI bleed on chronic plavix Fe deficiency anemia Pt presented with acute blood loss anemia secondary to upper GI bleed. Initial Hgb on presentation was 5.8. He was transfused two units pRBCs and his Hgb remained stable. Further lab work consistent with concurrent iron deficiency anemia with ferritin 40, iron 12, and TIBC 339. Pt received IV  iron on 1/10 and 1/13. GI was consulted and performed and EGD and small bowel enteroscopy on 1/14, which showed several non-bleeding angioectasias treated with argo plasma coagulation. It is thought that pt's recurrent GI bleeds (in July 2020 and now) were secondary to chronic Plavix use, that is necessary in the setting of multiple prior CVAs. It is recommended pt hold Plavix till 08/12/2019. If bleeding recurring again after restarting Plavix, then would discontinue this medication. Pt was evaluated by PT/OT who recommended rehab prior to returning to memory care unit.   COVID pneumonia Pt tested positive for COVID-19 on 1/7, two days prior to admission. CXR showed developing pneumonia, likely related to COVID infection. No documented episodes of hypoxia, so he did not require course of dexamethasone. Pt received a 5 day course of remdesivir while in the hospital. Remained asymptomatic.   AKI Hypernatremia Pt with 5-7 days decreased PO intake before admission. Cr. 1.9 and Na 150 at that time. Renal function and electrolytes stabilized after IV fluid resuscitation.    Discharge Vitals:   BP (!) 142/94   Pulse 66   Temp 98.9 F (37.2 C) (Oral)   Resp 16   Wt 68.4 kg   SpO2 100%   BMI 22.27 kg/m   Pertinent Labs, Studies, and Procedures:   Small Bowel Endoscopy on 08/04/2019 IMPRESSION: - Normal esophagus. - An previously placed endoclip was found in the stomach. - Six non-bleeding angioectasias in the duodenum. Treated with argon plasma coagulation (APC). - Two non-bleeding angioectasias in the jejunum. Treated with argon plasma coagulation (APC). - No specimens collected.  BMP Latest Ref Rng & Units 08/07/2019 08/04/2019 08/03/2019  Glucose 70 - 99 mg/dL 88 86 109(H)  BUN 8 - 23 mg/dL 25(H) 23 24(H)  Creatinine 0.61 - 1.24 mg/dL 1.19 1.19 1.19  Sodium 135 - 145 mmol/L 140 137 134(L)  Potassium 3.5 - 5.1 mmol/L 4.2 4.0 3.4(L)  Chloride 98 - 111 mmol/L 107 105 102  CO2 22 - 32  mmol/L 24 23 22   Calcium 8.9 - 10.3 mg/dL 9.5 9.3 9.2   CBC Latest Ref Rng & Units 08/07/2019 08/05/2019 08/04/2019  WBC 4.0 - 10.5 K/uL 8.6 9.1 8.7  Hemoglobin 13.0 - 17.0 g/dL 9.0(L) 9.2(L) 9.2(L)  Hematocrit 39.0 - 52.0 % 31.6(L) 32.7(L) 31.6(L)  Platelets 150 - 400 K/uL 470(H) 264 220   Lab Results  Component Value Date   IRON 12 (L) 07/30/2019   TIBC 339 07/30/2019   FERRITIN 48 07/31/2019   Recent Results (from the past 240 hour(s))  Blood Culture (routine x 2)     Status: None   Collection Time: 07/30/19 11:59 AM   Specimen: BLOOD  Result Value Ref Range Status   Specimen Description BLOOD RIGHT ANTECUBITAL  Final   Special Requests   Final    BOTTLES DRAWN AEROBIC AND ANAEROBIC Blood Culture adequate volume   Culture   Final    NO GROWTH 5 DAYS Performed at [facility], [address] [place], [place] [address]    Report Status 08/04/2019 FINAL  Final  Blood Culture (routine x 2)     Status: Abnormal   Collection Time: 07/30/19  3:45 PM   Specimen: BLOOD  Result Value Ref Range Status   Specimen Description BLOOD RIGHT ANTECUBITAL  Final   Special Requests   Final    BOTTLES DRAWN AEROBIC AND ANAEROBIC Blood Culture results may not be optimal due to an inadequate volume of blood received in culture bottles   Culture  Setup Time   Final    ANAEROBIC BOTTLE ONLY GRAM POSITIVE RODS CRITICAL RESULT CALLED TO, READ BACK BY AND VERIFIED WITH: Denton Brick [facility] 08/03/19 2326 JDW Performed at [facility], [place] [address], [place], [place] [address]    Culture PROPIONIBACTERIUM ACNES (A)  Final   Report Status 08/06/2019 FINAL  Final  Respiratory Panel by RT PCR (Flu A&B, Covid) - Nasopharyngeal Swab     Status: Abnormal   Collection Time: 07/30/19  8:35 PM   Specimen: Nasopharyngeal Swab  Result Value Ref Range Status   SARS Coronavirus 2 by RT PCR POSITIVE (A) NEGATIVE Final    Comment: RESULT CALLED TO, READ BACK BY AND VERIFIED WITH: RN WILL WALLACE AT 2142  BY MESSAN HOUEGNIFIO ON 07/30/2019 (NOTE) SARS-CoV-2 target nucleic acids are DETECTED. SARS-CoV-2 RNA is generally detectable in upper respiratory specimens  during the acute phase of infection. Positive results are indicative of the presence of the identified virus, but do not rule out bacterial infection or co-infection with other pathogens not detected by the test. Clinical correlation with patient history and other diagnostic information is necessary to determine patient infection status. The expected result is Negative. Fact Sheet for Patients:  PinkCheek.be Fact Sheet for Healthcare Providers: GravelBags.it This test is not yet approved or cleared by the [place] FDA and  has been authorized for detection and/or diagnosis of SARS-CoV-2 by FDA under an Emergency Use Authorization (EUA).  This EUA will remain in effect (meaning thi s test can be used) for the duration of  the COVID-19 declaration under Section 564(b)(1) of the Act, 21 U.S.C. section 360bbb-3(b)(1), unless the authorization is terminated or revoked sooner.    Influenza A by PCR NEGATIVE NEGATIVE Final   Influenza B by PCR NEGATIVE NEGATIVE Final    Comment: (NOTE) The Xpert Xpress SARS-CoV-2/FLU/RSV assay is intended as an aid in  the diagnosis of influenza from Nasopharyngeal swab specimens and  should not be used as a sole basis for treatment. Nasal washings and  aspirates are unacceptable for Xpert Xpress SARS-CoV-2/FLU/RSV  testing. Fact Sheet for Patients: PinkCheek.be Fact Sheet for Healthcare Providers: GravelBags.it This test is not yet approved or cleared by the [place] FDA and  has been authorized for detection and/or diagnosis of SARS-CoV-2 by  FDA under an Emergency Use Authorization (EUA). This EUA will remain  in effect (meaning this test can be used) for the duration of the   Covid-19 declaration under Section 564(b)(1) of the Act, 21  U.S.C. section 360bbb-3(b)(1), unless the authorization is  terminated or revoked. Performed at [facility], [place] [address], [place], [place] [address]   Culture, Urine     Status: Abnormal   Collection Time: 08/04/19  5:37 PM   Specimen: Urine, Random  Result Value Ref Range Status   Specimen Description URINE, RANDOM  Final   Special Requests   Final    NONE Performed at [facility], [address], [place], [place] [address]    Culture >=100,000 COLONIES/mL PROTEUS MIRABILIS (A)  Final   Report Status 08/07/2019 FINAL  Final   Organism ID, Bacteria PROTEUS MIRABILIS (A)  Final      Susceptibility   Proteus mirabilis - MIC*    AMPICILLIN <=2 SENSITIVE Sensitive     CEFAZOLIN <=4 SENSITIVE Sensitive     CEFTRIAXONE <=0.25 SENSITIVE Sensitive     CIPROFLOXACIN <=0.25 SENSITIVE Sensitive     GENTAMICIN <=1 SENSITIVE Sensitive     IMIPENEM 2 SENSITIVE Sensitive     NITROFURANTOIN 128 RESISTANT Resistant     TRIMETH/SULFA <=20 SENSITIVE Sensitive     AMPICILLIN/SULBACTAM <=2 SENSITIVE Sensitive     PIP/TAZO <=4 SENSITIVE Sensitive     * >=100,000 COLONIES/mL PROTEUS MIRABILIS    CXR 07/30/2019 CLINICAL DATA:  Chest pain.  COVID-19 positive  EXAM: PORTABLE CHEST 1 VIEW  COMPARISON:  Dec 04, 2017  FINDINGS: There is ill-defined opacity in the right base. Lungs elsewhere clear. Heart is upper normal in size with pulmonary vascularity normal. No adenopathy. No bone lesions. Loop recorder present on the left. No pneumothorax. There is aortic atherosclerosis.  IMPRESSION: Ill-defined opacity right base, likely developing pneumonia, potentially of atypical organism etiology. Lungs elsewhere clear. Heart upper normal in size. No adenopathy. Aortic Atherosclerosis (ICD10-I70.0).   Discharge Instructions: Discharge Instructions    Diet - low sodium heart healthy   Complete by: As directed     Discharge instructions   Complete by: As directed    It was a pleasure taking care of Karl Miller. Please ensure he hold off on taking his Plavix until 1/22. He can resume at this time.   Increase activity slowly   Complete by: As directed       Signed: Delice Bison, DO 08/08/2019, 12:12 PM   Pager: [phone]

## 2019-08-05 NOTE — TOC Progression Note (Signed)
Transition of Care [facility]) - Progression Note    Patient Details  Name: Kionte Surman MRN: [id number] Date of Birth: [birth year]  Transition of Care [facility]) CM/SW Contact  Bartholomew Crews, RN Phone Number: [phone] 08/05/2019, 3:26 PM  Clinical Narrative:    Notified by PT that recommendations were for SNF. PT made contact with [facility] who agreed with recommendation. Information faxed out to covid SNFs. Spoke with brother about recommendations and provided with current bed offer - brother in agreement.  [facility] accepted in hub - message left with admissions to follow up. MD notified of recommendations. TOC following.    Expected Discharge Plan: Assisted Living Barriers to Discharge: Continued Medical Work up  Expected Discharge Plan and Services Expected Discharge Plan: Assisted Living   Discharge Planning Services: CM Consult   Living arrangements for the past 2 months: Assisted Living Facility                                       Social Determinants of Health (SDOH) Interventions    Readmission Risk Interventions No flowsheet data found.

## 2019-08-05 NOTE — Progress Notes (Addendum)
Internal Medicine Attending:   I saw and examined the patient. I reviewed the resident's note and I agree with the resident's findings and plan as documented in the resident's note.  Patient denies any pain and feels well today.  Patient initially presented to the hospital with acute blood loss anemia secondary to upper GI bleed.  Patient's hemoglobin has been stable status post 2 units PRBC.  Patient is status post EGD with small bowel enteroscopy yesterday which showed several nonbleeding angiectasias that were treated with APC.  Will hold Plavix for additional 7 days prior to resuming.  The patient does have recurrent GI bleed will need to likely DC his Plavix.  Continue Protonix 40 mg daily.  No further work-up at this time.  Of note, patient does have COVID-19 pneumonia.  He has completed his 5-day course of remdesivir.  His O2 sats remained in the 90s on room air.  No further work-up at this time.  We will follow PT/OT evaluation.  Patient likely for DC back to his SNF today.  I called the patient's brother Tenzin Dickel) to discuss plan with him.  I explained to him that the patient is stable for DC today.  We are awaiting PT/OT evaluation prior to discharge to find out if he is stable for DC back to his facility today or if he will need rehab at an SNF.  Patient's brother expressed understanding and is in agreement with plan.  I also explained to him that we would hold his Plavix for another 7 days and then resume this.  Patient's brother is in agreement with plan.

## 2019-08-05 NOTE — Progress Notes (Signed)
   Subjective: No acute events overnight. Karl Miller is feeling well this morning. Denies abdominal pain. No further bleeding noted.   Objective:  Vital signs in last 24 hours: Vitals:   08/04/19 1659 08/04/19 2139 08/05/19 0615 08/05/19 0814  BP: 119/83 119/90 (!) 130/95 (!) 134/96  Pulse: 81 72 60 72  Resp:  16 18   Temp: 98.9 F (37.2 C) 98.4 F (36.9 C) 98.1 F (36.7 C) 98.1 F (36.7 C)  TempSrc: Oral Oral Oral Oral  SpO2: 98% 94% 94% 95%  Weight:       General: awake, alert, lying comfortably in bed in NAD CV: RRR Pulm: normal work of breathing; lungs CTAB  Abd: soft, non-tender, non-distended Ext: no edema   Assessment/Plan:  Active Problems:   Acute on chronic blood loss anemia   Angiodysplasia of intestine with hemorrhage  1.Acute blood loss anemia secondary to UGIB  -initially presented with hgb 5.8. He is s/p 2 units of pRBCs on admission with subsequent improvement in hgb to 9. Also received 2 doses of IV iron to help treat IDA. Hgb has now remained stable for >72 hours -appreciate GI consult; underwent EGD 1/14 which showed several non-bleeding angioectasias that were treated with APC.  - plan to hold Plavix for an additional 7 days prior to resuming. If he were to have recurrent bleeding, will likely need to discontinue as the risks of life threatening bleed would be outweighing benefits of stroke prevention.  -Continue withProtonix 40 mg daily  2.COVID-19 pneumonia: -Patient was noted to be Covid positive at his nursing facility on January 7. Chest x-ray done here showed developing pneumonia likely secondary to his underlying Covid -he has completed 5 day course of Remdesivir -Patient maintained O2 sats on room air throughout admission and therefore did not require course of Decadron  3.AKI likely prerenal: -Patient noted to have elevated creatinine up to 1.9 on admission from baseline of 1.4 -in the setting of decreased PO intake the last week;  also noted to be hypernatremic on admission -both have resolved with IVF  4.Hypertension, multiple prior CVAs: -Blood pressures well controlled currently. Lisinoprilwason hold secondary to his AKI.Will resume at discharge since AKI has resolved -holding Plavix an additional week as mentioned above  -continue with atorvastatin 40 mg daily.  Dispo: Patient is medically stable for discharge back to [facility] [place], [place] 08/05/2019, 10:55 AM Pager: [phone]

## 2019-08-05 NOTE — NC FL2 (Signed)
[place] LEVEL OF CARE SCREENING TOOL     IDENTIFICATION  Patient Name: Karl Miller Birthdate: 1956-03-08 Sex: male Admission Date (Current Location): 07/30/2019  [place] and [place] Number:  Kathleen Argue [id number] [place] and Address:  The [place]. [facility], [place] [address], [place], [place] [address]      Provider Number: [id number]  Attending Physician Name and Address:  Aldine Contes, MD  Relative Name and Phone Number:  Earlis Bizzell [phone]    Current Level of Care: Hospital Recommended Level of Care: [place] Prior Approval Number:    Date Approved/Denied: 12/07/17 PASRR Number: [id number]  Discharge Plan: SNF    Current Diagnoses: Patient Active Problem List   Diagnosis Date Noted  . Angiodysplasia of intestine with hemorrhage   . Acute on chronic blood loss anemia 07/30/2019  . Gastric and duodenal angiodysplasia with hemorrhage   . Heme positive stool   . Anemia   . Vascular dementia without behavioral disturbance ([place])   . Advanced care planning/counseling discussion   . Goals of care, counseling/discussion   . 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[place])   . Palliative care by specialist   . GI bleed 02/08/2019  . CVA (cerebral vascular accident) ([place]) 11/15/2018  . Benign essential HTN   . Tobacco abuse   . TIA (transient ischemic attack)   . Diastolic dysfunction   . Prediabetes   . Acute blood loss anemia   . Stage 3 chronic kidney disease   . Hyperlipidemia   . Syphilis   . Cerebral embolism with cerebral infarction 12/05/2017  . Smoker   . Acute encephalopathy 12/04/2017  . HTN (hypertension) 12/04/2017  . AKI (acute kidney injury) ([place]) 12/04/2017  . Elevated PSA 12/04/2017  . Rhabdomyolysis 12/04/2017    Orientation RESPIRATION BLADDER Height & Weight     Self  Normal Incontinent Weight: 68.4 kg Height:     BEHAVIORAL SYMPTOMS/MOOD NEUROLOGICAL BOWEL NUTRITION STATUS      Incontinent Diet   AMBULATORY STATUS COMMUNICATION OF NEEDS Skin   Extensive Assist Verbally Normal                       Personal Care Assistance Level of Assistance  Bathing, Feeding, Dressing Bathing Assistance: Maximum assistance Feeding assistance: Maximum assistance Dressing Assistance: Maximum assistance     Functional Limitations Info  Sight, Speech, Hearing Sight Info: Adequate Hearing Info: Adequate Speech Info: Adequate    SPECIAL CARE FACTORS FREQUENCY  PT (By licensed PT), OT (By licensed OT)     PT Frequency: PT at SNF to eval and treat a min of 5x/week OT Frequency: OT at SNF to eval and treat a min of 5x/week            Contractures Contractures Info: Not present    Additional Factors Info  Code Status, Allergies Code Status Info: Full Allergies Info: No known drug allergies           Current Medications (08/05/2019):  This is the current hospital active medication list Current Facility-Administered Medications  Medication Dose Route Frequency Provider Last Rate Last Admin  . acetaminophen (TYLENOL) suppository 650 mg  650 mg Rectal Q8H PRN Aldine Contes, MD   650 mg at 07/31/19 0113  . atorvastatin (LIPITOR) tablet 40 mg  40 mg Oral Daily [place], Carley D, DO   40 mg at 08/05/19 0846  . donepezil (ARICEPT) tablet 10 mg  10 mg Oral [address] Carley D, DO   10 mg  at 08/05/19 0846  . finasteride (PROSCAR) tablet 5 mg  5 mg Oral Daily [place], Carley D, DO   5 mg at 08/05/19 0846  . furosemide (LASIX) 10 MG/ML injection           . guaiFENesin (ROBITUSSIN) 100 MG/5ML solution 200 mg  10 mL Oral Q4H PRN [place], Carley D, DO      . neomycin-bacitracin-polymyxin (NEOSPORIN) ointment 1 application  1 application Topical PRN [place], Carley D, DO      . pantoprazole (PROTONIX) EC tablet 40 mg  40 mg Oral Q0600 Vena Rua, PA-C   40 mg at 08/05/19 [id number]     Discharge Medications: Please see discharge summary for a list of discharge  medications.  Relevant Imaging Results:  Relevant Lab Results:   Additional Information SSN: [id number] - Covid + 1/9  Bartholomew Crews, RN

## 2019-08-05 NOTE — Plan of Care (Signed)
No evidence of teaching d/t patient inability to respond further that an appropriate gesture ornod.

## 2019-08-05 NOTE — TOC Initial Note (Addendum)
Transition of Care [facility]) - Initial/Assessment Note    Patient Details  Name: Karl Miller MRN: [id number] Date of Birth: [birth year]  Transition of [facility]) CM/SW Contact:    Bartholomew Crews, RN Phone Number: [phone] 08/05/2019, 12:10 PM  Clinical Narrative:                 Damaris Schooner with Glenard Haring at [facility] able to accept patient back with covid + status. If home health is needed, they have in-house provider, Posey Boyer, who can provide therapy services as well as outsourcing to [facility] if needed. Patient would need [place] orders for therapy if discharged back to [facility]. PT and OT evals requested and now pending.   Spoke with patient's brother, [name], to discuss his readiness to transition back to [facility] pending therapy evaluations. At baseline patient required set up for feeding but has been able to feed himself. He normally ambulates with a walker and requires assistance with adls. If patient requires SNF rehab prior to return to [facility], [name] is agreeable stating that patient has previously been to [place]. Discussed limited SNF availability d/t covid + status - [name] verbalized understanding.   Patient will need PTAR to transport back to [facility].   TOC following.  Expected Discharge Plan: Assisted Living Barriers to Discharge: Continued Medical Work up   Patient Goals and CMS Choice   CMS Medicare.gov Compare Post Acute Care list provided to:: Patient Represenative (must comment)(Jeff Zigmund Daniel (brother/legal guardian))    Expected Discharge Plan and Services Expected Discharge Plan: Assisted Living   Discharge Planning Services: CM Consult   Living arrangements for the past 2 months: [place]                                      Prior Living Arrangements/Services Living arrangements for the past 2 months: [place] Lives with:: Facility Resident                    Activities of Daily Living Home Assistive Devices/Equipment: Cane (specify quad or straight) ADL Screening (condition at time of admission) Patient's cognitive ability adequate to safely complete daily activities?: No Is the patient deaf or have difficulty hearing?: No Does the patient have difficulty seeing, even when wearing glasses/contacts?: No Does the patient have difficulty concentrating, remembering, or making decisions?: Yes Patient able to express need for assistance with ADLs?: No Does the patient have difficulty dressing or bathing?: Yes Independently performs ADLs?: No Communication: Independent Dressing (OT): Needs assistance Is this a change from baseline?: Pre-admission baseline Grooming: Needs assistance Is this a change from baseline?: Pre-admission baseline In/Out Bed: Needs assistance Is this a change from baseline?: Pre-admission baseline Does the patient have difficulty walking or climbing stairs?: Yes Weakness of Legs: Both Weakness of Arms/Hands: None  Permission Sought/Granted                  Emotional Assessment              Admission diagnosis:  Acute blood loss anemia [D62] Gastrointestinal hemorrhage, unspecified gastrointestinal hemorrhage type [K92.2] Acute on chronic blood loss anemia [D62] COVID-19 [U07.1] Patient Active Problem List   Diagnosis Date Noted  . Angiodysplasia of intestine with hemorrhage   . Acute on chronic blood loss anemia 07/30/2019  . Gastric and duodenal angiodysplasia with hemorrhage   . Heme  positive stool   . Anemia   . Vascular dementia without behavioral disturbance ([place])   . Advanced care planning/counseling discussion   . Goals of care, counseling/discussion   . 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[place])   . Palliative care by specialist   . GI bleed 02/08/2019  . CVA (cerebral vascular accident) ([place]) 11/15/2018  . Benign essential HTN   . Tobacco abuse   . TIA (transient ischemic attack)   . Diastolic  dysfunction   . Prediabetes   . Acute blood loss anemia   . Stage 3 chronic kidney disease   . Hyperlipidemia   . Syphilis   . Cerebral embolism with cerebral infarction 12/05/2017  . Smoker   . Acute encephalopathy 12/04/2017  . HTN (hypertension) 12/04/2017  . AKI (acute kidney injury) ([place]) 12/04/2017  . Elevated PSA 12/04/2017  . Rhabdomyolysis 12/04/2017   PCP:  Administration[facility]   [id number] - [address], [place] [address] Phone: [phone] Fax: [phone]  [place], [place] - [address] [place] [phone] [place] [address] Phone: [phone] Fax: [phone]  [place], [place] - [address] [id number] [address] [place] [id number] [place] [address] Phone: [phone] Fax: [phone]     Social Determinants of Health (SDOH) Interventions    Readmission Risk Interventions No flowsheet data found.

## 2019-08-06 DIAGNOSIS — K5520 Angiodysplasia of colon without hemorrhage: Secondary | ICD-10-CM

## 2019-08-06 LAB — CULTURE, BLOOD (ROUTINE X 2)

## 2019-08-06 NOTE — Plan of Care (Signed)

## 2019-08-06 NOTE — Progress Notes (Signed)
  Date: 08/06/2019  Patient name: Karl Miller  Medical record number: [id number]  Date of birth: September 30, 1955        I have seen and evaluated this patient and I have discussed the plan of care with the house staff. Please see their note for complete details. I concur with their findings with the following additions/corrections: Karl Miller was seen this morning with Dr. Koleen Distance.  He had no respiratory distress, is on room air, and completed his remdesivir treatment.  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  His hemoglobin has remained stable and GI has recommended resumption of his Plavix in 7 days.  If he has rebleeding after resumption of Plavix, then it will need to be stopped indefinitely as he likely has other AVMs throughout his GI tract.  He will be discharged to SNF when a bed is available.  Bartholomew Crews, MD 08/06/2019, 1:20 PM

## 2019-08-06 NOTE — Progress Notes (Signed)
   Subjective: No acute events overnight. Karl Miller is feeling well without any acute complaints. No abdominal pain or further bleeding. States his PO intake has been ok.   Objective:  Vital signs in last 24 hours: Vitals:   08/05/19 0814 08/05/19 1556 08/05/19 1936 08/06/19 0800  BP: (!) 134/96 (!) 151/55 123/87 (!) 142/90  Pulse: 72 89 72 66  Resp:    18  Temp: 98.1 F (36.7 C) 97.8 F (36.6 C) 98 F (36.7 C) 97.6 F (36.4 C)  TempSrc: Oral Oral Oral Oral  SpO2: 95% 99% 98% 99%  Weight:       General: awake, alert, lying comfortably in bed in NAD CV: RRR Pulm: normal work of breathing; lungs CTAB Abd: soft, non-tender, non-distended  Assessment/Plan:  Active Problems:   Acute on chronic blood loss anemia   Angiodysplasia of intestine with hemorrhage  .Acute blood loss anemia secondary to UGIB  -initially presented with hgb 5.8. He is s/p 2 units of pRBCs on admission with subsequent improvement in hgb to 9. Also received 2 doses of IV iron to help treat IDA. Hgb has now remained stable for >72 hours -appreciate GI consult; underwent EGD 1/14 which showed several non-bleeding angioectasias that were treated with APC.  - plan to hold Plavix for an additional 7 days prior to resuming. If he were to have recurrent bleeding, will likely need to discontinue as the risks of life threatening bleed would be outweighing benefits of stroke prevention.  -Continue withProtonix 40 mg daily - PT/OT eval recommended SNF rehab prior to returning to memory care unit; appreciate social work helping to arrange this. Will likely have bed Monday or Tuesday.   2.COVID-19 pneumonia: -Patient was noted to be Covid positive at his nursing facility on January 7. Chest x-ray done here showed developing pneumonia likely secondary to his underlying Covid -he has completed 5 day course of Remdesivir -Patient has maintained O2 sats on room air throughout admission and therefore did not require  course of Decadron  3.AKIlikely prerenal: -Patient noted to have elevated creatinine up to 1.9 on admission from baseline of 1.4 -in the setting of decreased PO intake the last week; also noted to be hypernatremic on admission -both have resolved with IVF  4.Hypertension, multiple prior CVAs: -Blood pressures well controlled currently. Lisinoprilwason hold secondary to his AKI.Will resume at discharge since AKI has resolved -holding Plavix an additional week as mentioned above  -continue with atorvastatin 40 mg daily.  Dispo: Patient medically stable for discharge to SNF when bed available.   Karl Miller D, DO 08/06/2019, 11:15 AM Pager: [phone]

## 2019-08-07 LAB — BASIC METABOLIC PANEL
Anion gap: 9 (ref 5–15)
BUN: 25 mg/dL — ABNORMAL HIGH (ref 8–23)
CO2: 24 mmol/L (ref 22–32)
Calcium: 9.5 mg/dL (ref 8.9–10.3)
Chloride: 107 mmol/L (ref 98–111)
Creatinine, Ser: 1.19 mg/dL (ref 0.61–1.24)
GFR calc Af Amer: >60 mL/min (ref >60)
GFR calc non Af Amer: >60 mL/min (ref >60)
Glucose, Bld: 88 mg/dL (ref 70–99)
Potassium: 4.2 mmol/L (ref 3.5–5.1)
Sodium: 140 mmol/L (ref 135–145)

## 2019-08-07 LAB — CBC
HCT: 31.6 % — ABNORMAL LOW (ref 39.0–52.0)
Hemoglobin: 9 g/dL — ABNORMAL LOW (ref 13.0–17.0)
MCH: 21.7 pg — ABNORMAL LOW (ref 26.0–34.0)
MCHC: 28.5 g/dL — ABNORMAL LOW (ref 30.0–36.0)
MCV: 76.3 fL — ABNORMAL LOW (ref 80.0–100.0)
Platelets: 470 10*3/uL — ABNORMAL HIGH (ref 150–400)
RBC: 4.14 MIL/uL — ABNORMAL LOW (ref 4.22–5.81)
RDW: 27.4 % — ABNORMAL HIGH (ref 11.5–15.5)
WBC: 8.6 10*3/uL (ref 4.0–10.5)
nRBC: 0 % (ref 0.0–0.2)

## 2019-08-07 LAB — URINE CULTURE: Culture: >=100000 — AB

## 2019-08-07 NOTE — Progress Notes (Signed)
   Subjective: Mr. Zolnowski was seen and evaluated at bedside. No acute events overnight. He continues to feel well. Appetite somewhat improving. No fevers, chills, abdominal pain, or further bleeding episodes.   Objective:  Vital signs in last 24 hours: Vitals:   08/06/19 0800 08/06/19 1705 08/06/19 2056 08/07/19 0842  BP: (!) 142/90 137/87 (!) 131/92 (!) 129/94  Pulse: 66 65 82 70  Resp: 18 16  18   Temp: 97.6 F (36.4 C) 97.6 F (36.4 C) 98.2 F (36.8 C) 98.8 F (37.1 C)  TempSrc: Oral Oral Oral Oral  SpO2: 99% 100% 99% 100%  Weight:       General: awake, alert, lying comfortably in bed in NAD Pulm: breathing comfortably on room air Abd: abdomen soft, non-tender, non-distended Ext: no edema   Assessment/Plan:  Active Problems:   Acute on chronic blood loss anemia   Angiodysplasia of intestine with hemorrhage  1.Acute blood loss anemiasecondary to UGIB -initially presented with hgb 5.8. He is s/p 2 units of pRBCs on admission with subsequent improvement in hgb to 9. Also received 2 doses of IV iron to help treat IDA.  - Hgb continues to remain stable at 9 -appreciate GI consult; underwent EGD 1/14 which showed several non-bleeding angioectasias that were treated with APC.  - plan to hold Plavix for an additional 7 days prior to resuming (resume on 08/12/19). If he were to have recurrent bleeding, will likely need to discontinue as the risks of life threatening bleed would be outweighing benefits of stroke prevention. -Continue withProtonix 40 mg daily - PT/OT eval recommended SNF rehab prior to returning to memory care unit; appreciate social work helping to arrange this. Will likely have bed Monday or Tuesday.   2.COVID-19 pneumonia: -Patient was noted to be Covid positive at his nursing facility on January 7. Chest x-ray done here showed developing pneumonia likely secondary to his underlying Covid -he has completed 5 day course of Remdesivir -Patient has  maintained O2 sats on room air throughout admission and therefore did not require course of Decadron  3.AKIlikely prerenal: -Patient noted to have elevated creatinine up to 1.9 on admission from baseline of 1.4 -in the setting of decreased PO intake the last week; also noted to be hypernatremic on admission -both have resolved with IVF  4.Hypertension, multiple prior CVAs: -Blood pressures well controlled currently. Lisinoprilwason hold secondary to his AKI.Willresume at discharge since AKI has resolved -holdingPlavix an additional week as mentioned above  -continue with atorvastatin 40 mg daily.  Dispo: Patient medically stable for discharge to SNF when bed available.   [place]ena Nunnery D, DO 08/07/2019, 2:52 PM Pager: [phone]

## 2019-08-07 NOTE — Plan of Care (Signed)

## 2019-08-08 DIAGNOSIS — G20 Parkinson's disease: Secondary | ICD-10-CM

## 2019-08-08 DIAGNOSIS — E870 Hyperosmolality and hypernatremia: Secondary | ICD-10-CM

## 2019-08-08 MED ORDER — ATORVASTATIN CALCIUM 10 MG PO TABS: 40.0000 mg | ORAL_TABLET | Freq: Every day | ORAL | Status: DC

## 2019-08-08 NOTE — Progress Notes (Signed)
   Subjective: Mr. Strouse was seen and evaluated at bedside. He was sitting up eating lunch. He denied any abdominal pain or other acute concerns at this time. He will head to rehab today.   Objective:  Vital signs in last 24 hours: Vitals:   08/06/19 2056 08/07/19 0842 08/07/19 1603 08/08/19 0819  BP: (!) 131/92 (!) 129/94 (!) 126/98 (!) 142/94  Pulse: 82 70 64 66  Resp:  18 16   Temp: 98.2 F (36.8 C) 98.8 F (37.1 C) 98.7 F (37.1 C) 98.9 F (37.2 C)  TempSrc: Oral Oral Oral Oral  SpO2: 99% 100% 100% 100%  Weight:       General: awake, alert, sitting up in bed in NAD Abd: soft, non-tender, non-distended Ext: no edema   Assessment/Plan:  Active Problems:   Acute on chronic blood loss anemia   Angiodysplasia of intestine with hemorrhage  1.Acute blood loss anemiasecondary to UGIB -initially presented with hgb 5.8. He is s/p 2 units of pRBCs on admission with subsequent improvement in hgb to 9. Also received 2 doses of IV iron to help treat IDA.  - Hgb continues to remain stable at 9 -appreciate GI consult; underwent EGD 1/14 which showed several non-bleeding angioectasias that were treated with APC.  - plan to hold Plavix for an additional 7 days prior to resuming (resume on 08/12/19). If he were to have recurrent bleeding, will likely need to discontinue as the risks of life threatening bleed would be outweighing benefits of stroke prevention. -Continue withProtonix 40 mg daily - PT/OT eval recommended SNF rehab prior to returning to memory care unit; appreciate social work helping to arrange this. Bed available today.   2.COVID-19 pneumonia: -Patient was noted to be Covid positive at his nursing facility on January 7. Chest x-ray done here showed developing pneumonia likely secondary to his underlying Covid -he has completed 5 day course of Remdesivir -Patienthasmaintained O2 sats on room air throughout admission and therefore did not require course of  Decadron  3.AKIlikely prerenal: -Patient noted to have elevated creatinine up to 1.9 on admission from baseline of 1.4 -in the setting of decreased PO intake the last week; also noted to be hypernatremic on admission -both have resolved with IVF  4.Hypertension, multiple prior CVAs: -Blood pressures well controlled currently. Lisinoprilwason hold secondary to his AKI.Willresume at discharge since AKI has resolved -holdingPlavix an additional week as mentioned above  -continue with atorvastatin 40 mg daily   Dispo: Medically stable for discharge to SNF.   [place] Nunnery D, DO 08/08/2019, 11:50 AM Pager: [phone]

## 2019-08-08 NOTE — Progress Notes (Signed)
  Date: 08/08/2019  Patient name: Karl Miller  Medical record number: [id number]  Date of birth: October 03, 1955        I have seen and evaluated this patient and I have discussed the plan of care with the house staff. Please see their note for complete details. I concur with their findings.  Bartholomew Crews, MD 08/08/2019, 4:55 PM

## 2019-08-08 NOTE — Care Management (Addendum)
Patient has bed available at [facility].  Paged Dr Koleen Distance, no response. Sent message to Dr Koleen Distance via secure chat to notify of bed availabiilty.  Please advise CM ASAP if patient will DC today to facilitate DC.  1230 Faxed all forms to [facility] provided w bed number [phone] and report number, given to RN. PTAR called.  Forms on chart. Brother [place] notified.

## 2019-08-08 NOTE — Progress Notes (Addendum)
Report given to [place] at [facility]. Called and left voicemail for POA that pt will d/c to [facility] today.

## 2020-02-06 ENCOUNTER — Encounter (HOSPITAL_COMMUNITY): Payer: Self-pay | Admitting: Emergency Medicine

## 2020-02-06 ENCOUNTER — Other Ambulatory Visit: Payer: Self-pay

## 2020-02-06 ENCOUNTER — Inpatient Hospital Stay (HOSPITAL_COMMUNITY)
Admission: EM | Admit: 2020-02-06 | Discharge: 2020-02-11 | DRG: 378 | Disposition: A | Discharge status: Skilled Nursing Facility | Payer: No Typology Code available for payment source | Attending: Family Medicine | Admitting: Family Medicine

## 2020-02-06 DIAGNOSIS — K31811 Angiodysplasia of stomach and duodenum with bleeding: Secondary | ICD-10-CM | POA: Diagnosis present

## 2020-02-06 DIAGNOSIS — E7800 Pure hypercholesterolemia, unspecified: Secondary | ICD-10-CM | POA: Diagnosis present

## 2020-02-06 DIAGNOSIS — Z20822 Contact with and (suspected) exposure to covid-19: Secondary | ICD-10-CM | POA: Diagnosis present

## 2020-02-06 DIAGNOSIS — F0150 Vascular dementia without behavioral disturbance: Secondary | ICD-10-CM | POA: Diagnosis present

## 2020-02-06 DIAGNOSIS — D62 Acute posthemorrhagic anemia: Secondary | ICD-10-CM | POA: Diagnosis present

## 2020-02-06 DIAGNOSIS — F0280 Dementia in other diseases classified elsewhere without behavioral disturbance: Secondary | ICD-10-CM | POA: Diagnosis not present

## 2020-02-06 DIAGNOSIS — K5521 Angiodysplasia of colon with hemorrhage: Principal | ICD-10-CM | POA: Diagnosis present

## 2020-02-06 DIAGNOSIS — R231 Pallor: Secondary | ICD-10-CM | POA: Diagnosis present

## 2020-02-06 DIAGNOSIS — R001 Bradycardia, unspecified: Secondary | ICD-10-CM | POA: Diagnosis present

## 2020-02-06 DIAGNOSIS — N183 Chronic kidney disease, stage 3 unspecified: Secondary | ICD-10-CM | POA: Diagnosis present

## 2020-02-06 DIAGNOSIS — K922 Gastrointestinal hemorrhage, unspecified: Secondary | ICD-10-CM | POA: Diagnosis not present

## 2020-02-06 DIAGNOSIS — Z8673 Personal history of transient ischemic attack (TIA), and cerebral infarction without residual deficits: Secondary | ICD-10-CM | POA: Diagnosis not present

## 2020-02-06 DIAGNOSIS — D649 Anemia, unspecified: Secondary | ICD-10-CM | POA: Diagnosis present

## 2020-02-06 DIAGNOSIS — G309 Alzheimer's disease, unspecified: Secondary | ICD-10-CM | POA: Diagnosis present

## 2020-02-06 DIAGNOSIS — N182 Chronic kidney disease, stage 2 (mild): Secondary | ICD-10-CM | POA: Diagnosis present

## 2020-02-06 DIAGNOSIS — I129 Hypertensive chronic kidney disease with stage 1 through stage 4 chronic kidney disease, or unspecified chronic kidney disease: Secondary | ICD-10-CM | POA: Diagnosis present

## 2020-02-06 DIAGNOSIS — F17210 Nicotine dependence, cigarettes, uncomplicated: Secondary | ICD-10-CM | POA: Diagnosis present

## 2020-02-06 DIAGNOSIS — E870 Hyperosmolality and hypernatremia: Secondary | ICD-10-CM | POA: Diagnosis present

## 2020-02-06 DIAGNOSIS — Z79899 Other long term (current) drug therapy: Secondary | ICD-10-CM

## 2020-02-06 DIAGNOSIS — K635 Polyp of colon: Secondary | ICD-10-CM | POA: Diagnosis present

## 2020-02-06 DIAGNOSIS — E785 Hyperlipidemia, unspecified: Secondary | ICD-10-CM | POA: Diagnosis present

## 2020-02-06 DIAGNOSIS — N400 Enlarged prostate without lower urinary tract symptoms: Secondary | ICD-10-CM | POA: Diagnosis present

## 2020-02-06 DIAGNOSIS — Z7902 Long term (current) use of antithrombotics/antiplatelets: Secondary | ICD-10-CM | POA: Diagnosis not present

## 2020-02-06 DIAGNOSIS — Z8659 Personal history of other mental and behavioral disorders: Secondary | ICD-10-CM

## 2020-02-06 DIAGNOSIS — I10 Essential (primary) hypertension: Secondary | ICD-10-CM | POA: Diagnosis present

## 2020-02-06 LAB — CBC WITH DIFFERENTIAL/PLATELET
Abs Immature Granulocytes: 0.01 10*3/uL (ref 0.00–0.07)
Basophils Absolute: 0 10*3/uL (ref 0.0–0.1)
Basophils Relative: 1 %
Eosinophils Absolute: 0.2 10*3/uL (ref 0.0–0.5)
Eosinophils Relative: 4 %
HCT: 20.5 % — ABNORMAL LOW (ref 39.0–52.0)
Hemoglobin: 5.2 g/dL — CL (ref 13.0–17.0)
Immature Granulocytes: 0 %
Lymphocytes Relative: 15 %
Lymphs Abs: 0.6 10*3/uL — ABNORMAL LOW (ref 0.7–4.0)
MCH: 19.1 pg — ABNORMAL LOW (ref 26.0–34.0)
MCHC: 25.4 g/dL — ABNORMAL LOW (ref 30.0–36.0)
MCV: 75.4 fL — ABNORMAL LOW (ref 80.0–100.0)
Monocytes Absolute: 0.4 10*3/uL (ref 0.1–1.0)
Monocytes Relative: 11 %
Neutro Abs: 2.5 10*3/uL (ref 1.7–7.7)
Neutrophils Relative %: 69 %
Platelets: 277 10*3/uL (ref 150–400)
RBC: 2.72 MIL/uL — ABNORMAL LOW (ref 4.22–5.81)
RDW: 19.7 % — ABNORMAL HIGH (ref 11.5–15.5)
WBC: 3.7 10*3/uL — ABNORMAL LOW (ref 4.0–10.5)
nRBC: 0 % (ref 0.0–0.2)

## 2020-02-06 LAB — COMPREHENSIVE METABOLIC PANEL
ALT: 20 U/L (ref 0–44)
AST: 19 U/L (ref 15–41)
Albumin: 3.9 g/dL (ref 3.5–5.0)
Alkaline Phosphatase: 52 U/L (ref 38–126)
Anion gap: 11 (ref 5–15)
BUN: 26 mg/dL — ABNORMAL HIGH (ref 8–23)
CO2: 26 mmol/L (ref 22–32)
Calcium: 9.9 mg/dL (ref 8.9–10.3)
Chloride: 109 mmol/L (ref 98–111)
Creatinine, Ser: 1.34 mg/dL — ABNORMAL HIGH (ref 0.61–1.24)
GFR calc Af Amer: >60 mL/min (ref >60)
GFR calc non Af Amer: 55 mL/min — ABNORMAL LOW (ref >60)
Glucose, Bld: 169 mg/dL — ABNORMAL HIGH (ref 70–99)
Potassium: 4.3 mmol/L (ref 3.5–5.1)
Sodium: 146 mmol/L — ABNORMAL HIGH (ref 135–145)
Total Bilirubin: <0.1 mg/dL — ABNORMAL LOW (ref 0.3–1.2)
Total Protein: 7.5 g/dL (ref 6.5–8.1)

## 2020-02-06 LAB — SARS CORONAVIRUS 2 BY RT PCR (HOSPITAL ORDER, PERFORMED IN ~~LOC~~ HOSPITAL LAB): SARS Coronavirus 2: NEGATIVE

## 2020-02-06 LAB — PROTIME-INR
INR: 1 (ref 0.8–1.2)
Prothrombin Time: 13.1 seconds (ref 11.4–15.2)

## 2020-02-06 LAB — PREPARE RBC (CROSSMATCH)

## 2020-02-06 LAB — POC OCCULT BLOOD, ED: Fecal Occult Bld: POSITIVE — AB

## 2020-02-06 MED ORDER — ONDANSETRON HCL 4 MG PO TABS: 4.0000 mg | ORAL_TABLET | Freq: Four times a day (QID) | ORAL | Status: DC | PRN

## 2020-02-06 MED ORDER — SODIUM CHLORIDE 0.9 % IV SOLN
10.0000 mL/h | Freq: Once | INTRAVENOUS | Status: AC
  Administered 2020-02-06 – 2020-02-07 (×2): 10 mL/h via INTRAVENOUS

## 2020-02-06 MED ORDER — ONDANSETRON HCL 4 MG/2ML IJ SOLN: 4.0000 mg | Freq: Four times a day (QID) | INTRAMUSCULAR | Status: DC | PRN

## 2020-02-06 MED ORDER — SODIUM CHLORIDE 0.9 % IV BOLUS
500.0000 mL | Freq: Once | INTRAVENOUS | Status: AC
  Administered 2020-02-06: 500 mL via INTRAVENOUS

## 2020-02-06 MED ORDER — PANTOPRAZOLE SODIUM 40 MG IV SOLR
40.0000 mg | Freq: Once | INTRAVENOUS | Status: AC
  Administered 2020-02-06: 40 mg via INTRAVENOUS
  Filled 2020-02-06: qty 40

## 2020-02-06 MED ORDER — DEXTROSE-NACL 5-0.9 % IV SOLN: INTRAVENOUS | Status: DC

## 2020-02-06 NOTE — ED Provider Notes (Signed)
[place] DEPT Provider Note   CSN: [id number] Arrival date & time: 02/06/20  1713     History Abnormal lab  Karl Miller is a 64 y.o. male with past medical history significant for hypercholesterolemia, hypertension, CVA, GI bleed presents for evaluation abnormal lab.  Was seen at [facility] today and noted to have a hemoglobin of 5.9.  Unfortunately patient with history of vascular dementia, Alzheimer's and is unable to provide majority of history.  He resides at [facility].  9 of history obtained from patient's brother due to history of dementia.  Unknown patient's been having any melanotic or bright red blood per rectum.  He denies any complaints in the room.  History of GI bleed was seen by [facility] on prior admission almost a year PTA. Patient denies any weakness however family in room state patient seems weaker than normal.   Level 5 caveat-Dementia     HPI     Past Medical History:  Diagnosis Date  . Acute encephalopathy 12/03/2017   [name] 12/04/2017  . High cholesterol   . Hypertension   . Stroke ([place])   . TIA (transient ischemic attack) 12/03/2017   [name] 12/04/2017    Patient Active Problem List   Diagnosis Date Noted  . Symptomatic anemia 02/06/2020  . Hypernatremia 02/06/2020  . Angiodysplasia of intestine with hemorrhage   . Acute on chronic blood loss anemia 07/30/2019  . Gastric and duodenal angiodysplasia with hemorrhage   . Heme positive stool   . Anemia   . Vascular dementia without behavioral disturbance ([place])   . Advanced care planning/counseling discussion   . Goals of care, counseling/discussion   . 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[place])   . Palliative care by specialist   . GI bleed 02/08/2019  . CVA (cerebral vascular accident) ([place]) 11/15/2018  . Benign essential HTN   . Tobacco abuse   . TIA (transient ischemic attack)   . Diastolic dysfunction   . Prediabetes   . Acute blood loss anemia   . Stage  3 chronic kidney disease   . Hyperlipidemia   . Syphilis   . Cerebral embolism with cerebral infarction 12/05/2017  . Smoker   . Acute encephalopathy 12/04/2017  . HTN (hypertension) 12/04/2017  . AKI (acute kidney injury) ([place]) 12/04/2017  . Elevated PSA 12/04/2017  . Rhabdomyolysis 12/04/2017    Past Surgical History:  Procedure Laterality Date  . COLONOSCOPY WITH PROPOFOL N/A 02/11/2019   Procedure: COLONOSCOPY WITH PROPOFOL;  Surgeon: [name], MD;  Location: [place];  Service: Gastroenterology;  Laterality: N/A;  . ENTEROSCOPY N/A 08/04/2019   Procedure: ENTEROSCOPY;  Surgeon: [name], MD;  Location: [facility] ENDOSCOPY;  Service: Gastroenterology;  Laterality: N/A;  . ESOPHAGOGASTRODUODENOSCOPY (EGD) WITH PROPOFOL N/A 02/11/2019   Procedure: ESOPHAGOGASTRODUODENOSCOPY (EGD) WITH PROPOFOL;  Surgeon: [name], MD;  Location: [facility] ENDOSCOPY;  Service: Gastroenterology;  Laterality: N/A;  . HEMOSTASIS CLIP PLACEMENT  02/11/2019   Procedure: HEMOSTASIS CLIP PLACEMENT;  Surgeon: [name], MD;  Location: [place];  Service: Gastroenterology;;  . HEMOSTASIS CONTROL  02/11/2019   Procedure: HEMOSTASIS CONTROL;  Surgeon: [name], MD;  Location: [place];  Service: Gastroenterology;;  . HOT HEMOSTASIS N/A 02/11/2019   Procedure: HOT HEMOSTASIS (ARGON PLASMA COAGULATION/BICAP);  Surgeon: [name], MD;  Location: [facility] ENDOSCOPY;  Service: Gastroenterology;  Laterality: N/A;  . HOT HEMOSTASIS N/A 08/04/2019   Procedure: HOT HEMOSTASIS (ARGON PLASMA COAGULATION/BICAP);  Surgeon: [name], MD;  Location:  [place] ENDOSCOPY;  Service: Gastroenterology;  Laterality: N/A;  . LOOP RECORDER INSERTION N/A 12/07/2017   Procedure: LOOP RECORDER INSERTION;  Surgeon: Constance Haw, MD;  Location: [place] CV LAB;  Service: Cardiovascular;  Laterality: N/A;  . NO PAST SURGERIES    . TEE WITHOUT CARDIOVERSION N/A 12/07/2017   Procedure: TRANSESOPHAGEAL ECHOCARDIOGRAM  (TEE);  Surgeon: Dorothy Spark, MD;  Location: [facility] ENDOSCOPY;  Service: Cardiovascular;  Laterality: N/A;       History reviewed. No pertinent family history.  Social History   Tobacco Use  . Smoking status: Current Every Day Smoker    Packs/day: 0.50    Years: 24.00    Pack years: 12.00    Types: Cigarettes  . Smokeless tobacco: Never Used  Vaping Use  . Vaping Use: Never used  Substance Use Topics  . Alcohol use: No  . Drug use: No    Home Medications Prior to Admission medications   Medication Sig Start Date End Date Taking? Authorizing Provider  acetaminophen (TYLENOL) 500 MG tablet Take 500-1,000 mg by mouth every 8 (eight) hours as needed for mild pain.     [provider]  alum & mag hydroxide-simeth (MYLANTA) 200-200-20 MG/5ML suspension Take 30 mLs by mouth as needed for indigestion or heartburn.    [provider]  atorvastatin (LIPITOR) 10 MG tablet Take 4 tablets (40 mg total) by mouth daily. 08/08/19   [place], Carley D, DO  donepezil (ARICEPT) 10 MG tablet Take 10 mg by mouth every morning.    [provider]  ferrous sulfate 325 (65 FE) MG tablet Take 325 mg by mouth 3 (three) times a week. Mon, Wed, Fri    [provider]  finasteride (PROSCAR) 5 MG tablet Take 5 mg by mouth daily.    [provider]  guaiFENesin (ROBITUSSIN) 100 MG/5ML SOLN Take 10 mLs by mouth every 4 (four) hours as needed for cough or to loosen phlegm.    [provider]  lisinopril (ZESTRIL) 10 MG tablet Take 1 tablet (10 mg total) by mouth daily. 02/15/19 07/30/19  Madalyn Rob, MD  loperamide (IMODIUM) 2 MG capsule Take 2 mg by mouth as needed for diarrhea or loose stools (not to exceed 8 doses in 24hrs).    [provider]  magnesium hydroxide (MILK OF MAGNESIA) 400 MG/5ML suspension Take 30 mLs by mouth at bedtime as needed for mild constipation.     [provider]  neomycin-bacitracin-polymyxin (NEOSPORIN) OINT  Apply 1 application topically as needed for wound care.    [provider]  pantoprazole (PROTONIX) 40 MG tablet Take 40 mg by mouth daily.    [provider]  Vitamin D, Ergocalciferol, (DRISDOL) 1.25 MG (50000 UT) CAPS capsule Take 50,000 Units by mouth every 7 (seven) days. Wednesday    [provider]   Allergies    Patient has no known allergies.  Review of Systems   Review of Systems  Unable to perform ROS: Dementia  Respiratory: Negative.   Cardiovascular: Negative.   Gastrointestinal: Negative.   Genitourinary: Negative.   Musculoskeletal: Negative.   Skin: Negative.   Neurological: Negative.   All other systems reviewed and are negative.  Physical Exam Updated Vital Signs BP (!) 143/86   Pulse 87   Temp 99.1 F (37.3 C) (Oral)   Resp 20   Ht 5\' 9"  (1.753 m)   Wt 72.6 kg   SpO2 100%   BMI 23.63 kg/m   Physical Exam Vitals and  nursing note reviewed.  Constitutional:      General: He is not in acute distress.    Appearance: He is well-developed. He is not toxic-appearing or diaphoretic.  HENT:     Head: Normocephalic and atraumatic.  Eyes:     Pupils: Pupils are equal, round, and reactive to light.  Cardiovascular:     Rate and Rhythm: Normal rate and regular rhythm.     Pulses: Normal pulses.          Radial pulses are 2+ on the right side and 2+ on the left side.       Dorsalis pedis pulses are 2+ on the right side and 2+ on the left side.     Heart sounds: Normal heart sounds.  Pulmonary:     Effort: Pulmonary effort is normal. No respiratory distress.     Breath sounds: Normal breath sounds.  Abdominal:     General: Bowel sounds are normal. There is no distension.     Palpations: Abdomen is soft.     Tenderness: There is no abdominal tenderness. There is no right CVA tenderness, left CVA tenderness, guarding or rebound.  Genitourinary:    Comments: Diaper in place, light brown stool in rectal vault. Musculoskeletal:         General: Normal range of motion.     Cervical back: Normal range of motion and neck supple.     Comments: Moves all 4 extremities without difficulty  Skin:    General: Skin is warm and dry.     Capillary Refill: Capillary refill takes less than 2 seconds.     Comments: No ecchymosis, petechiae, purpura  Neurological:     Mental Status: He is alert. Mental status is at baseline.     Comments: At baseline per family in room however does appear generally weaker No facial droop Walks with a walker at baseline    ED Results / Procedures / Treatments   Labs (all labs ordered are listed, but only abnormal results are displayed) Labs Reviewed  COMPREHENSIVE METABOLIC PANEL - Abnormal; Notable for the following components:      Result Value   Sodium 146 (*)    Glucose, Bld 169 (*)    BUN 26 (*)    Creatinine, Ser 1.34 (*)    Total Bilirubin <0.1 (*)    GFR calc non Af Amer 55 (*)    All other components within normal limits  CBC WITH DIFFERENTIAL/PLATELET - Abnormal; Notable for the following components:   WBC 3.7 (*)    RBC 2.72 (*)    Hemoglobin 5.2 (*)    HCT 20.5 (*)    MCV 75.4 (*)    MCH 19.1 (*)    MCHC 25.4 (*)    RDW 19.7 (*)    Lymphs Abs 0.6 (*)    All other components within normal limits  POC OCCULT BLOOD, ED - Abnormal; Notable for the following components:   Fecal Occult Bld POSITIVE (*)    All other components within normal limits  SARS CORONAVIRUS 2 BY RT PCR (HOSPITAL ORDER, [place] LAB)  PROTIME-INR  TYPE AND SCREEN  PREPARE RBC (CROSSMATCH)    EKG None  Radiology No results found.  Procedures .Critical Care Performed by: Nettie Elm, PA-C Authorized by: Nettie Elm, PA-C   Critical care provider statement:    Critical care time (minutes):  45   Critical care was necessary to treat or prevent imminent or life-threatening deterioration  of the following conditions:  Dehydration and endocrine crisis   Critical  care was time spent personally by me on the following activities:  Discussions with consultants, evaluation of patient's response to treatment, examination of patient, ordering and performing treatments and interventions, ordering and review of laboratory studies, ordering and review of radiographic studies, pulse oximetry, re-evaluation of patient's condition, obtaining history from patient or surrogate and review of old charts   (including critical care time)  Medications Ordered in ED Medications  0.9 %  sodium chloride infusion (has no administration in time range)  pantoprazole (PROTONIX) injection 40 mg (has no administration in time range)  sodium chloride 0.9 % bolus 500 mL (0 mLs Intravenous Stopped 02/06/20 2034)    ED Course  I have reviewed the triage vital signs and the nursing notes.  Pertinent labs & imaging results that were available during my care of the patient were reviewed by me and considered in my medical decision making (see chart for details).  64 year old presents for evaluation of abnormal lab.  Seen at [facility] earlier today and noted to have anemia 5.1.  Patient afebrile, nonseptic, not ill-appearing.  No evidence of trauma.  Does have history of GI bleed, was seen by all of our GI approximately 1 year ago for similar complaints.  Patient fortunately has history of dementia and Alzheimer's unable to provide majority of history.  Brother states patient is at his baseline mentation however does appear generally weaker.  Has not noticed any melanotic or bright red blood in patient's diaper.  He is on Plavix.  Plan on labs, imaging and reassess  Labs imaging personally viewed and interpreted: Occult positive CBC hemoglobin at 5.2, leukocytosis at 3.7 CMP hypernatremia at 146, hyperglycemia at 169, BUN elevated at 26, Creatinine at 1.34 INR 1.0 COVID pending, received vaccine and has COVID in 07/2019 EKG without STEMI  Reassessed.  Discussed anemia with family and  patient in room.  They are agreeable to transfusion.  Will admit to hospitalist for critically low hemoglobin requiring transfusion. Will likely need GI consult give occult positive however do no think they need to see him emergently tonight in the ED.  CONSULT with Dr. Jonelle Sidle with [facility] who agrees to evaluate patient for admission. Discussed GI consult needed in the morning for evaluation for possible GI bleed.  The patient appears reasonably stabilized for admission considering the current resources, flow, and capabilities available in the ED at this time, and I doubt any other [facility] requiring further screening and/or treatment in the ED prior to admission.  Patient seen and evaluated by attending Dr. Ron Parker who agrees with above treatment, plan and disposition.    MDM Rules/Calculators/A&P                           Final Clinical Impression(s) / ED Diagnoses Final diagnoses:  Anemia, unspecified type  Gastrointestinal hemorrhage, unspecified gastrointestinal hemorrhage type  History of dementia    Rx / DC Orders ED Discharge Orders    None       Rosella Crandell A, PA-C 02/06/20 2050    Breck Coons, MD 02/06/20 2227

## 2020-02-06 NOTE — ED Triage Notes (Signed)
Patient taken to [facility] for regular check-up, PCP states hgb 5.1 and to come to ED. Patient denies any symptoms. Denies pain.

## 2020-02-06 NOTE — H&P (Signed)
History and Physical   Karl Miller EUM:[id number] DOB: 02/05/1956 DOA: 02/06/2020  Referring MD/NP/PA: Dr. Eulis Foster  [facility]   Outpatient Specialists: None  Patient coming from: Memory unit skilled nursing facility  Chief Complaint: Weakness  HPI: Karl Miller is a 64 y.o. male with medical history significant of dementia currently in the memory unit, hypertension, hyperlipidemia, history of encephalopathy, history of TIA and CVA, history of angiodysplasia of the abdomen with recurrent GI bleed who was brought in by his brother today after visiting his PCP for physicals.  Work-up done showed hemoglobin of 5.2.  Patient has no capacity to give history not sure if he has been having GI bleed.  He is currently guaiac positive x2 indicating slow GI bleed.  He is otherwise hemodynamically stable.  GI is consulted transfusion has been ordered and patient is being admitted to the medical service with symptomatic anemia.  ED Course: Temperature 99.1 blood pressure 120/76 pulse 190 respirate 21 oxygen sat 100% on room air.  White count 3.7 hemoglobin 5.2 and platelets of 277.  Sodium 146 potassium 4.3 chloride 109 CO2 26 his BUN is 26 creatinine 1.34 glucose 169.  Stool guaiac is positive x2  Review of Systems: As per HPI otherwise 10 point review of systems negative.    Past Medical History:  Diagnosis Date   Acute encephalopathy 12/03/2017   [name] 12/04/2017   High cholesterol    Hypertension    Stroke [facility])    TIA (transient ischemic attack) 12/03/2017   [name] 12/04/2017    Past Surgical History:  Procedure Laterality Date   COLONOSCOPY WITH PROPOFOL N/A 02/11/2019   Procedure: COLONOSCOPY WITH PROPOFOL;  Surgeon: [name], MD;  Location: [place];  Service: Gastroenterology;  Laterality: N/A;   ENTEROSCOPY N/A 08/04/2019   Procedure: ENTEROSCOPY;  Surgeon: [name], MD;  Location: [facility] ENDOSCOPY;  Service: Gastroenterology;  Laterality: N/A;    ESOPHAGOGASTRODUODENOSCOPY (EGD) WITH PROPOFOL N/A 02/11/2019   Procedure: ESOPHAGOGASTRODUODENOSCOPY (EGD) WITH PROPOFOL;  Surgeon: [name], MD;  Location: [facility] ENDOSCOPY;  Service: Gastroenterology;  Laterality: N/A;   HEMOSTASIS CLIP PLACEMENT  02/11/2019   Procedure: HEMOSTASIS CLIP PLACEMENT;  Surgeon: [name], MD;  Location: [place];  Service: Gastroenterology;;   HEMOSTASIS CONTROL  02/11/2019   Procedure: HEMOSTASIS CONTROL;  Surgeon: [name], MD;  Location: [place];  Service: Gastroenterology;;   HOT HEMOSTASIS N/A 02/11/2019   Procedure: HOT HEMOSTASIS (ARGON PLASMA COAGULATION/BICAP);  Surgeon: [name], MD;  Location: [facility] ENDOSCOPY;  Service: Gastroenterology;  Laterality: N/A;   HOT HEMOSTASIS N/A 08/04/2019   Procedure: HOT HEMOSTASIS (ARGON PLASMA COAGULATION/BICAP);  Surgeon: [name], MD;  Location: [facility] ENDOSCOPY;  Service: Gastroenterology;  Laterality: N/A;   LOOP RECORDER INSERTION N/A 12/07/2017   Procedure: LOOP RECORDER INSERTION;  Surgeon: Constance Haw, MD;  Location: [place] CV LAB;  Service: Cardiovascular;  Laterality: N/A;   NO PAST SURGERIES     TEE WITHOUT CARDIOVERSION N/A 12/07/2017   Procedure: TRANSESOPHAGEAL ECHOCARDIOGRAM (TEE);  Surgeon: Dorothy Spark, MD;  Location: [facility] ENDOSCOPY;  Service: Cardiovascular;  Laterality: N/A;     reports that he has been smoking cigarettes. He has a 12.00 pack-year smoking history. He has never used smokeless tobacco. He reports that he does not drink alcohol and does not use drugs.  No Known Allergies  History reviewed. No pertinent family history.   Prior to Admission medications   Medication Sig Start Date End Date Taking? Authorizing Provider  acetaminophen (TYLENOL) 500 MG tablet Take 500-1,000 mg by mouth every 8 (eight) hours as needed for mild pain.     [provider]  alum & mag hydroxide-simeth (MYLANTA) 200-200-20 MG/5ML suspension Take 30 mLs by  mouth as needed for indigestion or heartburn.    [provider]  atorvastatin (LIPITOR) 10 MG tablet Take 4 tablets (40 mg total) by mouth daily. 08/08/19   [place]om[place], Carley D, DO  donepezil (ARICEPT) 10 MG tablet Take 10 mg by mouth every morning.    [provider]  ferrous sulfate 325 (65 FE) MG tablet Take 325 mg by mouth 3 (three) times a week. Mon, Wed, Fri    [provider]  finasteride (PROSCAR) 5 MG tablet Take 5 mg by mouth daily.    [provider]  guaiFENesin (ROBITUSSIN) 100 MG/5ML SOLN Take 10 mLs by mouth every 4 (four) hours as needed for cough or to loosen phlegm.    [provider]  lisinopril (ZESTRIL) 10 MG tablet Take 1 tablet (10 mg total) by mouth daily. 02/15/19 07/30/19  Madalyn Rob, MD  loperamide (IMODIUM) 2 MG capsule Take 2 mg by mouth as needed for diarrhea or loose stools (not to exceed 8 doses in 24hrs).    [provider]  magnesium hydroxide (MILK OF MAGNESIA) 400 MG/5ML suspension Take 30 mLs by mouth at bedtime as needed for mild constipation.     [provider]  neomycin-bacitracin-polymyxin (NEOSPORIN) OINT Apply 1 application topically as needed for wound care.    [provider]  pantoprazole (PROTONIX) 40 MG tablet Take 40 mg by mouth daily.    [provider]  Vitamin D, Ergocalciferol, (DRISDOL) 1.25 MG (50000 UT) CAPS capsule Take 50,000 Units by mouth every 7 (seven) days. Wednesday    [provider]    Physical Exam: Vitals:   02/06/20 1735 02/06/20 1744 02/06/20 1850  BP: 131/86  122/76  Pulse: (!) 119  89  Resp: 18  (!) 21  Temp: 99.1 F (37.3 C)    TempSrc: Oral    SpO2: 100%  100%  Weight:  72.6 kg   Height:  5\' 9"  (1.753 m)       Constitutional: Confused, noncommunicating, demented Vitals:   02/06/20 1735 02/06/20 1744 02/06/20 1850  BP: 131/86  122/76  Pulse: (!) 119  89  Resp: 18  (!) 21  Temp: 99.1 F (37.3 C)    TempSrc: Oral     SpO2: 100%  100%  Weight:  72.6 kg   Height:  5\' 9"  (3.154 m)    Eyes: PERRL, lids and conjunctivae pale ENMT: Mucous membranes are dry. Posterior pharynx clear of any exudate or lesions.Normal dentition.  Neck: normal, supple, no masses, no thyromegaly Respiratory: clear to auscultation bilaterally, no wheezing, no crackles. Normal respiratory effort. No accessory muscle use.  Cardiovascular: Sinus tachycardia, no murmurs / rubs / gallops. No extremity edema. 2+ pedal pulses. No carotid bruits.  Abdomen: no tenderness, no masses palpated. No hepatosplenomegaly. Bowel sounds positive.  Musculoskeletal: no clubbing / cyanosis. No joint deformity upper and lower extremities. Good ROM, no contractures. Normal muscle tone.  Skin: no rashes, lesions, ulcers. No induration Neurologic: CN 2-12 grossly intact. Sensation intact, DTR normal. Strength 5/5 in all 4.  Psychiatric: Dementia, laying down in bed   Labs on Admission: I have personally reviewed following labs and imaging studies  CBC: Recent Labs  Lab 02/06/20 1848  WBC 3.7*  NEUTROABS 2.5  HGB 5.2*  HCT 20.5*  MCV 75.4*  PLT 716   Basic Metabolic Panel: Recent Labs  Lab 02/06/20 1848  NA 146*  K 4.3  CL 109  CO2 26  GLUCOSE 169*  BUN 26*  CREATININE 1.34*  CALCIUM 9.9   GFR: Estimated Creatinine Clearance: 54.2 mL/min (A) (by C-G formula based on SCr of 1.34 mg/dL (H)). Liver Function Tests: Recent Labs  Lab 02/06/20 1848  AST 19  ALT 20  ALKPHOS 52  BILITOT <0.1*  PROT 7.5  ALBUMIN 3.9   No results for input(s): LIPASE, AMYLASE in the last 168 hours. No results for input(s): AMMONIA in the last 168 hours. Coagulation Profile: Recent Labs  Lab 02/06/20 1848  INR 1.0   Cardiac Enzymes: No results for input(s): CKTOTAL, CKMB, CKMBINDEX, TROPONINI in the last 168 hours. BNP (last 3 results) No results for input(s): PROBNP in the last 8760 hours. HbA1C: No results for input(s): HGBA1C in the last 72  hours. CBG: No results for input(s): GLUCAP in the last 168 hours. Lipid Profile: No results for input(s): CHOL, HDL, LDLCALC, TRIG, CHOLHDL, LDLDIRECT in the last 72 hours. Thyroid Function Tests: No results for input(s): TSH, T4TOTAL, FREET4, T3FREE, THYROIDAB in the last 72 hours. Anemia Panel: No results for input(s): VITAMINB12, FOLATE, FERRITIN, TIBC, IRON, RETICCTPCT in the last 72 hours. Urine analysis:    Component Value Date/Time   COLORURINE YELLOW 08/04/2019 1903   APPEARANCEUR TURBID (A) 08/04/2019 1903   LABSPEC 1.024 08/04/2019 1903   PHURINE 9.0 (H) 08/04/2019 1903   GLUCOSEU NEGATIVE 08/04/2019 1903   HGBUR NEGATIVE 08/04/2019 1903   BILIRUBINUR NEGATIVE 08/04/2019 1903   KETONESUR NEGATIVE 08/04/2019 1903   PROTEINUR >=300 (A) 08/04/2019 1903   NITRITE NEGATIVE 08/04/2019 1903   LEUKOCYTESUR NEGATIVE 08/04/2019 1903   Sepsis Labs: @LABRCNTIP (procalcitonin:4,lacticidven:4) )No results found for this or any previous visit (from the past 240 hour(s)).   Radiological Exams on Admission: No results found.  EKG: Independently reviewed.  Sinus tachycardia with no significant ST changes  Assessment/Plan Principal Problem:   Symptomatic anemia Active Problems:   HTN (hypertension)   Hyperlipidemia   Stage 3 chronic kidney disease   GI bleed   Angiodysplasia of intestine with hemorrhage   Hypernatremia     #1 symptomatic anemia: Secondary to GI bleed.  Patient will be admitted.  Transfused 2 units of packed red blood cells.  Follow H&H closely.  GI already consulted to see patient in the morning.  I will keep patient on n.p.o.  #2 guaiac positive stool: Secondary to slow GI bleed.  History of angiodysplasia of the colon.  Defer to GI.  IV Protonix and follow-up.  #3 vascular dementia: Patient in the memory care unit.  No agitation.  #4 chronic kidney disease stage III: Stable at baseline.  #5 hypertension: Blood pressure control will continue.  For now  hold blood pressure medicines due to GI bleed  #6 hyperlipidemia: Resume statin when stable.   DVT prophylaxis: SCD Code Status: Full code Family Communication: Brother at bedside Disposition Plan: Back to memory unit Consults called: [facility] in the morning. Admission status: Inpatient  Severity of Illness: The appropriate patient status for this patient is INPATIENT. Inpatient status is judged to be reasonable and necessary in order to provide the required intensity of service to ensure the patient's safety. The patient's presenting symptoms, physical exam findings, and initial radiographic and laboratory data in the context of their chronic comorbidities is felt to place them at high risk  for further clinical deterioration. Furthermore, it is not anticipated that the patient will be medically stable for discharge from the hospital within 2 midnights of admission. The following factors support the patient status of inpatient.   " The patient's presenting symptoms include generalized weakness. " The worrisome physical exam findings include demented no distress. " The initial radiographic and laboratory data are worrisome because of hemoglobin of 5.2. " The chronic co-morbidities include vascular dementia.   * I certify that at the point of admission it is my clinical judgment that the patient will require inpatient hospital care spanning beyond 2 midnights from the point of admission due to high intensity of service, high risk for further deterioration and high frequency of surveillance required.Barbette Merino MD [facility] Pager [phone]  If 7PM-7AM, please contact night-coverage [email or web address] Password [email or web address]  02/06/2020, 8:25 PM

## 2020-02-07 LAB — CBC
HCT: 17.7 % — ABNORMAL LOW (ref 39.0–52.0)
HCT: 25.3 % — ABNORMAL LOW (ref 39.0–52.0)
Hemoglobin: 4.4 g/dL — CL (ref 13.0–17.0)
Hemoglobin: 7.1 g/dL — ABNORMAL LOW (ref 13.0–17.0)
MCH: 18.7 pg — ABNORMAL LOW (ref 26.0–34.0)
MCH: 22 pg — ABNORMAL LOW (ref 26.0–34.0)
MCHC: 24.9 g/dL — ABNORMAL LOW (ref 30.0–36.0)
MCHC: 28.1 g/dL — ABNORMAL LOW (ref 30.0–36.0)
MCV: 75.3 fL — ABNORMAL LOW (ref 80.0–100.0)
MCV: 78.6 fL — ABNORMAL LOW (ref 80.0–100.0)
Platelets: 243 10*3/uL (ref 150–400)
Platelets: 237 10*3/uL (ref 150–400)
RBC: 2.35 MIL/uL — ABNORMAL LOW (ref 4.22–5.81)
RBC: 3.22 MIL/uL — ABNORMAL LOW (ref 4.22–5.81)
RDW: 19.5 % — ABNORMAL HIGH (ref 11.5–15.5)
RDW: 19.5 % — ABNORMAL HIGH (ref 11.5–15.5)
WBC: 3.7 10*3/uL — ABNORMAL LOW (ref 4.0–10.5)
WBC: 4.7 10*3/uL (ref 4.0–10.5)
nRBC: 0 % (ref 0.0–0.2)
nRBC: 0 % (ref 0.0–0.2)

## 2020-02-07 LAB — COMPREHENSIVE METABOLIC PANEL
ALT: 18 U/L (ref 0–44)
AST: 17 U/L (ref 15–41)
Albumin: 3.5 g/dL (ref 3.5–5.0)
Alkaline Phosphatase: 36 U/L — ABNORMAL LOW (ref 38–126)
Anion gap: 6 (ref 5–15)
BUN: 23 mg/dL (ref 8–23)
CO2: 26 mmol/L (ref 22–32)
Calcium: 9.5 mg/dL (ref 8.9–10.3)
Chloride: 112 mmol/L — ABNORMAL HIGH (ref 98–111)
Creatinine, Ser: 1.16 mg/dL (ref 0.61–1.24)
GFR calc Af Amer: >60 mL/min (ref >60)
GFR calc non Af Amer: >60 mL/min (ref >60)
Glucose, Bld: 86 mg/dL (ref 70–99)
Potassium: 4.2 mmol/L (ref 3.5–5.1)
Sodium: 144 mmol/L (ref 135–145)
Total Bilirubin: 0.3 mg/dL (ref 0.3–1.2)
Total Protein: 6.6 g/dL (ref 6.5–8.1)

## 2020-02-07 LAB — PREPARE RBC (CROSSMATCH)

## 2020-02-07 MED ORDER — DONEPEZIL HCL 10 MG PO TABS
10.0000 mg | ORAL_TABLET | Freq: Every day | ORAL | Status: DC
  Administered 2020-02-08: 10 mg via ORAL
  Filled 2020-02-07 (×2): qty 1

## 2020-02-07 MED ORDER — ATORVASTATIN CALCIUM 40 MG PO TABS
40.0000 mg | ORAL_TABLET | Freq: Every day | ORAL | Status: DC
  Administered 2020-02-07 – 2020-02-11 (×4): 40 mg via ORAL
  Filled 2020-02-07 (×5): qty 1

## 2020-02-07 MED ORDER — SODIUM CHLORIDE 0.9% IV SOLUTION: Freq: Once | INTRAVENOUS | Status: AC

## 2020-02-07 MED ORDER — PANTOPRAZOLE SODIUM 40 MG IV SOLR
40.0000 mg | Freq: Two times a day (BID) | INTRAVENOUS | Status: DC
  Administered 2020-02-07 – 2020-02-11 (×9): 40 mg via INTRAVENOUS
  Filled 2020-02-07 (×9): qty 40

## 2020-02-07 MED ORDER — FINASTERIDE 5 MG PO TABS
5.0000 mg | ORAL_TABLET | Freq: Every day | ORAL | Status: DC
  Administered 2020-02-07 – 2020-02-11 (×4): 5 mg via ORAL
  Filled 2020-02-07 (×5): qty 1

## 2020-02-07 NOTE — Progress Notes (Signed)
PROGRESS NOTE    Karl Miller  HBZ:[id number]  DOB: [birth year]  DOA: 02/06/2020 [facility] Out[facility] Specialists:   Hospital course:  Karl Miller is a 64 y.o. 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  In the ED he was noted to be guaiac positive x2 but he was hemodynamically stable throughout.  Subjective:  Patient is unable to provide me with a history.  He is smiling and agreeable, is joking with me I think when he says that he is "in [place] where that fight is happening" gesturing to the boxing match that was on the TV.  Patient does not know why he is here and denies feeling unwell.  Denies chest pain or shortness of breath.  Denies headache.  Objective: Vitals:   02/07/20 1323 02/07/20 1417 02/07/20 1542 02/07/20 1656  BP: (!) 156/106 (!) 134/95 (!) 152/89 (!) 158/97  Pulse: (!) 54 76 (!) 51 67  Resp: 15 18 16 18   Temp: 97.9 F (36.6 C)  98.1 F (36.7 C) 98.3 F (36.8 C)  TempSrc: Oral  Oral Oral  SpO2: 100% 99% 100%   Weight:      Height:        Intake/Output Summary (Last 24 hours) at 02/07/2020 1729 Last data filed at 02/07/2020 1705 Gross per 24 hour  Intake 1847 ml  Output --  Net 1847 ml   Filed Weights   02/06/20 1744  Weight: 72.6 kg     Exam:  General: Relatively well-appearing gentleman lying flat in bed watching TV in no acute distress. Eyes: sclera anicteric, conjuctiva mild injection bilaterally CVS: S1-S2, regular  Respiratory:  decreased air entry bilaterally secondary to decreased inspiratory effort, rales at bases  GI: NABS, soft, NT  LE: No edema.  Neuro:  grossly nonfocal.  Psych: Advanced dementia  Assessment & Plan:   Symptomatic anemia secondary to GI bleed with  known angiodysplasia  Patient has likely been bleeding for a while given his stable blood pressure and no acute shortness of breath. Hemoglobin has decreased a little bit despite patient getting at least 1-1/2 units PRBC started overnight. We will start transfusion with another 2 units for a total of 4 units PRBC Follow lung exam closely Repeat H&H after the fourth unit and transfuse further as needed Patient does have some antibodies in his blood which may come somewhat difficult to match.  GI bleed Patient seen by [facility] earlier today  Very much appreciate their note, it seems that patient has been continued on Plavix even though it had been discontinued in the past. We are holding Plavix here Patient is for repeat EGD 02/09/2020  BPH Continue finasteride  Dementia Continue Aricept  HTN Lisinopril  HL Continue statin   DVT prophylaxis: SCD Code Status: Full Family Communication: Patient's brother is well aware Disposition Plan:   Patient is from: Memory care unit  Anticipated Discharge Location: SNF/memory care unit  Barriers to Discharge: Ongoing GI bleed  Is patient medically stable for Discharge: No   Consultants:  [facility]  Procedures:  None so far  Antimicrobials:  None   Data Reviewed:  Basic Metabolic Panel: Recent Labs  Lab 02/06/20 1848 02/07/20 0910  NA 146* 144  K 4.3 4.2  CL 109 112*  CO2 26 26  GLUCOSE 169* 86  BUN 26* 23  CREATININE 1.34* 1.16  CALCIUM 9.9 9.5   Liver Function Tests: Recent Labs  Lab 02/06/20 1848 02/07/20 0910  AST 19 17  ALT 20 18  ALKPHOS 52 36*  BILITOT <0.1* 0.3  PROT 7.5 6.6  ALBUMIN 3.9 3.5   No results for input(s): LIPASE, AMYLASE in the last 168 hours. No results for input(s): AMMONIA in the last 168 hours. CBC: Recent Labs  Lab 02/06/20 1848 02/07/20 0248 02/07/20 0910  WBC 3.7* 3.7* 4.7  NEUTROABS 2.5  --   --   HGB 5.2* 4.4* 7.1*  HCT 20.5* 17.7* 25.3*  MCV 75.4* 75.3* 78.6*  PLT  277 243 237   Cardiac Enzymes: No results for input(s): CKTOTAL, CKMB, CKMBINDEX, TROPONINI in the last 168 hours. BNP (last 3 results) No results for input(s): PROBNP in the last 8760 hours. CBG: No results for input(s): GLUCAP in the last 168 hours.  Recent Results (from the past 240 hour(s))  SARS Coronavirus 2 by RT PCR (hospital order, performed in [facility] hospital lab) Nasopharyngeal Nasopharyngeal Swab     Status: None   Collection Time: 02/06/20  6:48 PM   Specimen: Nasopharyngeal Swab  Result Value Ref Range Status   SARS Coronavirus 2 NEGATIVE NEGATIVE Final    Comment: (NOTE) SARS-CoV-2 target nucleic acids are NOT DETECTED.  The SARS-CoV-2 RNA is generally detectable in upper and lower respiratory specimens during the acute phase of infection. The lowest concentration of SARS-CoV-2 viral copies this assay can detect is 250 copies / mL. A negative result does not preclude SARS-CoV-2 infection and should not be used as the sole basis for treatment or other patient management decisions.  A negative result may occur with improper specimen collection / handling, submission of specimen other than nasopharyngeal swab, presence of viral mutation(s) within the areas targeted by this assay, and inadequate number of viral copies (<250 copies / mL). A negative result must be combined with clinical observations, patient history, and epidemiological information.  Fact Sheet for Patients:   StrictlyIdeas.no  Fact Sheet for Healthcare Providers: BankingDealers.co.za  This test is not yet approved or  cleared by the [place] FDA and has been authorized for detection and/or diagnosis of SARS-CoV-2 by FDA under an Emergency Use Authorization (EUA).  This EUA will remain in effect (meaning this test can be used) for the duration of the COVID-19 declaration under Section 564(b)(1) of the Act, 21 U.S.C. section 360bbb-3(b)(1),  unless the authorization is terminated or revoked sooner.  Performed at [facility], [place] [address] [place], [place] [address]       Studies: No results found.   Scheduled Meds: . pantoprazole (PROTONIX) IV  40 mg Intravenous Q12H   Continuous Infusions: . dextrose 5 % and 0.9% NaCl Stopped (02/07/20 0657)    Principal Problem:   Symptomatic anemia Active Problems:   HTN (hypertension)   Hyperlipidemia   Stage 3 chronic kidney disease   GI bleed   Angiodysplasia of intestine with hemorrhage   Hypernatremia     Dewaine Oats Derek Jack, [facility]  If 7PM-7AM, please contact night-coverage [email or web address] Password [email or web address] 02/07/2020, 5:29 PM    LOS: 1 day

## 2020-02-07 NOTE — ED Notes (Signed)
GI at bedside

## 2020-02-07 NOTE — Consult Note (Signed)
Referring Provider: Nettie Elm PA-C Primary Care Physician:  [facility] Primary Gastroenterologist:  unassigned  Reason for Consultation:  Anemia, FOBT positive stool  HPI: Karl Miller is a 64 y.o. 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  Patient went to a [place] for follow-up and was found to be anemic with Hgb 5.1 and was thus sent to the ED.  Patient has advanced dementia and is unable to provide any history.  Denies known rectal bleeding.  Denies any current abdominal pain, nausea, vomiting, chest pain, or shortness of breath.  Oriented only to self.  Reports location as "[place]," year as "71" and unable to name president.    Spoke with RN at care facility.  Patient on Plavix, she believes last dose was yesterday.  She denies seeing any visible rectal bleeding/melena.  GI history: -Small bowel enteroscopy 08/04/19: 6 non-bleeding angioectasias in the duodenum, treated with APC. 2 non-bleeding angioectasias in the jejunum, treated with APC. -EGD 02/11/2019: 4 angioectasias in the stomach, 1 actively bleeding. Injected and treated with APC; clips placed. A single non-bleeding angioectasia in the duodenal bulb, injected and treated with APC.Two non-bleeding angioectasias in the second portion of the duodenum, treated with APC. -Colonoscopy 02/11/2019: no evidence of active or recent bleeding seen in the colon. Two sessile polyps were found in the descending colon and transverse colon. The polyps were small in size. Polypectomy was not attempted due to inadequate bowel preparation, low-risk nature of these polyps and recent Plavix use. Multiple small and large-mouthed diverticula.   Past Medical History:  Diagnosis Date   Acute encephalopathy 12/03/2017   [name] 12/04/2017   High cholesterol    Hypertension    Stroke [facility])    TIA (transient ischemic attack)  12/03/2017   [name] 12/04/2017    Past Surgical History:  Procedure Laterality Date   COLONOSCOPY WITH PROPOFOL N/A 02/11/2019   Procedure: COLONOSCOPY WITH PROPOFOL;  Surgeon: [name], MD;  Location: [place];  Service: Gastroenterology;  Laterality: N/A;   ENTEROSCOPY N/A 08/04/2019   Procedure: ENTEROSCOPY;  Surgeon: [name], MD;  Location: [facility] ENDOSCOPY;  Service: Gastroenterology;  Laterality: N/A;   ESOPHAGOGASTRODUODENOSCOPY (EGD) WITH PROPOFOL N/A 02/11/2019   Procedure: ESOPHAGOGASTRODUODENOSCOPY (EGD) WITH PROPOFOL;  Surgeon: [name], MD;  Location: [facility] ENDOSCOPY;  Service: Gastroenterology;  Laterality: N/A;   HEMOSTASIS CLIP PLACEMENT  02/11/2019   Procedure: HEMOSTASIS CLIP PLACEMENT;  Surgeon: [name], MD;  Location: [place];  Service: Gastroenterology;;   HEMOSTASIS CONTROL  02/11/2019   Procedure: HEMOSTASIS CONTROL;  Surgeon: [name], MD;  Location: [place];  Service: Gastroenterology;;   HOT HEMOSTASIS N/A 02/11/2019   Procedure: HOT HEMOSTASIS (ARGON PLASMA COAGULATION/BICAP);  Surgeon: [name], MD;  Location: [facility] ENDOSCOPY;  Service: Gastroenterology;  Laterality: N/A;   HOT HEMOSTASIS N/A 08/04/2019   Procedure: HOT HEMOSTASIS (ARGON PLASMA COAGULATION/BICAP);  Surgeon: [name], MD;  Location: [facility] ENDOSCOPY;  Service: Gastroenterology;  Laterality: N/A;   LOOP RECORDER INSERTION N/A 12/07/2017   Procedure: LOOP RECORDER INSERTION;  Surgeon: Constance Haw, MD;  Location: [place] CV LAB;  Service: Cardiovascular;  Laterality: N/A;   NO PAST SURGERIES     TEE WITHOUT CARDIOVERSION N/A 12/07/2017   Procedure: TRANSESOPHAGEAL ECHOCARDIOGRAM (TEE);  Surgeon: Dorothy Spark, MD;  Location: [facility] ENDOSCOPY;  Service: Cardiovascular;  Laterality: N/A;    Prior to Admission medications  Medication Sig Start Date End Date Taking? Authorizing Provider  acetaminophen (TYLENOL) 500 MG tablet Take 500-1,000 mg by  mouth every 8 (eight) hours as needed for mild pain.    Yes [provider]  alum & mag hydroxide-simeth (MYLANTA) 200-200-20 MG/5ML suspension Take 30 mLs by mouth as needed for indigestion or heartburn.   Yes [provider]  atorvastatin (LIPITOR) 10 MG tablet Take 4 tablets (40 mg total) by mouth daily. 08/08/19  Yes [place], Carley D, DO  donepezil (ARICEPT) 10 MG tablet Take 10 mg by mouth every morning.   Yes [provider]  ferrous sulfate 325 (65 FE) MG tablet Take 325 mg by mouth 3 (three) times a week. Mon, Wed, Fri   Yes [provider]  finasteride (PROSCAR) 5 MG tablet Take 5 mg by mouth daily.   Yes [provider]  guaiFENesin (ROBITUSSIN) 100 MG/5ML SOLN Take 10 mLs by mouth every 4 (four) hours as needed for cough or to loosen phlegm.   Yes [provider]  lisinopril (ZESTRIL) 10 MG tablet Take 1 tablet (10 mg total) by mouth daily. 02/15/19 02/06/20 Yes Madalyn Rob, MD  loperamide (IMODIUM) 2 MG capsule Take 2 mg by mouth as needed for diarrhea or loose stools (not to exceed 8 doses in 24hrs).   Yes [provider]  magnesium hydroxide (MILK OF MAGNESIA) 400 MG/5ML suspension Take 30 mLs by mouth at bedtime as needed for mild constipation.    Yes [provider]  neomycin-bacitracin-polymyxin (NEOSPORIN) OINT Apply 1 application topically as needed for wound care.   Yes [provider]  pantoprazole (PROTONIX) 40 MG tablet Take 40 mg by mouth daily.   Yes [provider]  Vitamin D, Ergocalciferol, (DRISDOL) 1.25 MG (50000 UT) CAPS capsule Take 50,000 Units by mouth every 7 (seven) days. Wednesday   Yes [provider]    Scheduled Meds:  pantoprazole (PROTONIX) IV  40 mg Intravenous Q12H   Continuous Infusions:  dextrose 5 % and 0.9% NaCl Stopped (02/07/20 0657)   PRN Meds:.ondansetron **OR** ondansetron (ZOFRAN) IV  Allergies as of 02/06/2020   (No Known Allergies)     History reviewed. No pertinent family history.  Social History   Socioeconomic History   Marital status: Single    Spouse name: Not on file   Number of children: Not on file   Years of education: Not on file   Highest education level: Not on file  Occupational History   Not on file  Tobacco Use   Smoking status: Current Every Day Smoker    Packs/day: 0.50    Years: 24.00    Pack years: 12.00    Types: Cigarettes   Smokeless tobacco: Never Used  Vaping Use   Vaping Use: Never used  Substance and Sexual Activity   Alcohol use: No   Drug use: No   Sexual activity: Not Currently  Other Topics Concern   Not on file  Social History Narrative   Not on file   Social Determinants of Health   Financial Resource Strain:    Difficulty of Paying Living Expenses:   Food Insecurity:    Worried About Charity fundraiser in the Last Year:    Arboriculturist in the Last Year:   Transportation Needs:    Film/video editor (Medical):    Lack of Transportation (Non-Medical):   Physical Activity:    Days of Exercise per Week:    Minutes of Exercise per  Session:   Stress:    Feeling of Stress :   Social Connections:    Frequency of Communication with Friends and Family:    Frequency of Social Gatherings with Friends and Family:    Attends Religious Services:    Active Member of Clubs or Organizations:    Attends Music therapist:    Marital Status:   Intimate Partner Violence:    Fear of Current or Ex-Partner:    Emotionally Abused:    Physically Abused:    Sexually Abused:     Review of Systems: Limited by patient status (dementia). See HPI above.  Physical Exam: Vital signs: Vitals:   02/07/20 0919 02/07/20 1000  BP: 128/79 (!) 169/106  Pulse: 73 78  Resp: 20 18  Temp:    SpO2: 100% 100%     Physical Exam Constitutional:      General: He is not in acute distress.    Appearance: Normal appearance.  HENT:      Head: Normocephalic.     Nose: Nose normal.     Mouth/Throat:     Mouth: Mucous membranes are moist.     Pharynx: Oropharynx is clear.  Eyes:     Extraocular Movements: Extraocular movements intact.     Comments: Conjunctival pallor  Cardiovascular:     Rate and Rhythm: Normal rate and regular rhythm.     Pulses: Normal pulses.     Heart sounds: Normal heart sounds.  Pulmonary:     Effort: Pulmonary effort is normal. No respiratory distress.     Breath sounds: Normal breath sounds.  Abdominal:     General: Abdomen is flat. Bowel sounds are normal. There is no distension.     Palpations: Abdomen is soft. There is no mass.     Tenderness: There is no abdominal tenderness. There is no guarding or rebound.     Hernia: No hernia is present.  Musculoskeletal:        General: No swelling or tenderness.     Cervical back: Normal range of motion and neck supple.  Skin:    General: Skin is warm and dry.  Neurological:     General: No focal deficit present.     Mental Status: He is alert. He is disoriented.  Psychiatric:        Mood and Affect: Mood normal.        Behavior: Behavior normal.    GI:  Lab Results: Recent Labs    02/06/20 1848 02/07/20 0248 02/07/20 0910  WBC 3.7* 3.7* 4.7  HGB 5.2* 4.4* 7.1*  HCT 20.5* 17.7* 25.3*  PLT 277 243 237   BMET Recent Labs    02/06/20 1848 02/07/20 0910  NA 146* 144  K 4.3 4.2  CL 109 112*  CO2 26 26  GLUCOSE 169* 86  BUN 26* 23  CREATININE 1.34* 1.16  CALCIUM 9.9 9.5   LFT Recent Labs    02/07/20 0910  PROT 6.6  ALBUMIN 3.5  AST 17  ALT 18  ALKPHOS 36*  BILITOT 0.3   PT/INR Recent Labs    02/06/20 1848  LABPROT 13.1  INR 1.0   Studies/Results: No results found.  Impression: Anemia with FOBT positive stool.  Suspect bleeding from gastric and/or small bowel AVMs. -Hgb 5.2 on arrival yesterday, 4.4 this morning, improved to 7.1 after 2u pRBCs. -BUN 23/Cr 1.16 -INR 1.0 as of 7/19 -Hemodynamically stable  (normotensive, normal HR)  History of CVA on Plavix, last dose 02/06/20  Dementia  Plan: EGD on Thursday 7/22.  Due to Plavix use and current hemodynamic stability, we do not plan to complete EGD sooner unless destabilizing bleeding occurs.  Dr. Paulita Fujita discussed options with patient's POA and brother Dellis Filbert, who prefers to proceed with EGD.  Continue Protonix IV BID.  Continue to monitor H&H with transfusion as needed to maintain Hgb >7.  [facility] will follow.   LOS: 1 day   Salley Slaughter  PA-C 02/07/2020, 10:37 AM  Contact #  [phone]

## 2020-02-08 DIAGNOSIS — K5521 Angiodysplasia of colon with hemorrhage: Principal | ICD-10-CM

## 2020-02-08 DIAGNOSIS — F0150 Vascular dementia without behavioral disturbance: Secondary | ICD-10-CM

## 2020-02-08 LAB — BASIC METABOLIC PANEL
Anion gap: 9 (ref 5–15)
BUN: 18 mg/dL (ref 8–23)
CO2: 22 mmol/L (ref 22–32)
Calcium: 9.3 mg/dL (ref 8.9–10.3)
Chloride: 107 mmol/L (ref 98–111)
Creatinine, Ser: 1.21 mg/dL (ref 0.61–1.24)
GFR calc Af Amer: >60 mL/min (ref >60)
GFR calc non Af Amer: >60 mL/min (ref >60)
Glucose, Bld: 115 mg/dL — ABNORMAL HIGH (ref 70–99)
Potassium: 3.8 mmol/L (ref 3.5–5.1)
Sodium: 138 mmol/L (ref 135–145)

## 2020-02-08 LAB — CBC
HCT: 32.5 % — ABNORMAL LOW (ref 39.0–52.0)
Hemoglobin: 9.7 g/dL — ABNORMAL LOW (ref 13.0–17.0)
MCH: 22.9 pg — ABNORMAL LOW (ref 26.0–34.0)
MCHC: 29.8 g/dL — ABNORMAL LOW (ref 30.0–36.0)
MCV: 76.7 fL — ABNORMAL LOW (ref 80.0–100.0)
Platelets: 224 10*3/uL (ref 150–400)
RBC: 4.24 MIL/uL (ref 4.22–5.81)
RDW: 19 % — ABNORMAL HIGH (ref 11.5–15.5)
WBC: 5.7 10*3/uL (ref 4.0–10.5)
nRBC: 0 % (ref 0.0–0.2)

## 2020-02-08 NOTE — Progress Notes (Signed)
[facility] Gastroenterology Progress Note  Karl Miller 64 y.o. 07/02/1956  CC:  Anemia and FOBT positive stools  Subjective: Patient feels "fine" today. Denies any abdominal pain, nausea, or vomiting.   Two mushy bowel movements this morning per flow sheet; color not documented.  Patient more oriented today.  Able to report location and reason for admission (bleeding).  Unable to report year.  ROS : Review of Systems  Cardiovascular: Negative for chest pain and palpitations.  Gastrointestinal: Negative for abdominal pain, blood in stool, constipation, diarrhea, heartburn, melena, nausea and vomiting.    Objective: Vital signs in last 24 hours: Vitals:   02/08/20 0348 02/08/20 0525  BP: (!) 153/95 (!) 164/87  Pulse: (!) 58 (!) 51  Resp: 19 18  Temp: 98.2 F (36.8 C) 98 F (36.7 C)  SpO2: 100% 99%    Physical Exam:  General:  Alert, cooperative, no distress, oriented to self, location, and situation  Head:  Normocephalic, without obvious abnormality, atraumatic  Eyes:  Mild conjunctival pallor, EOMs intact   Lungs:   Clear to auscultation bilaterally, respirations unlabored  Heart:  Mildly bradycardic with regular rate  Abdomen:   Soft, non-tender, non-distended, bowel sounds active all four quadrants, no guarding or peritoneal signs  Extremities: Extremities normal, atraumatic, no  edema  Pulses: 2+ and symmetric    Lab Results: Recent Labs    02/07/20 0910 02/08/20 0700  NA 144 138  K 4.2 3.8  CL 112* 107  CO2 26 22  GLUCOSE 86 115*  BUN 23 18  CREATININE 1.16 1.21  CALCIUM 9.5 9.3   Recent Labs    02/06/20 1848 02/07/20 0910  AST 19 17  ALT 20 18  ALKPHOS 52 36*  BILITOT <0.1* 0.3  PROT 7.5 6.6  ALBUMIN 3.9 3.5   Recent Labs    02/06/20 1848 02/07/20 0248 02/07/20 0910 02/08/20 0901  WBC 3.7*   < > 4.7 5.7  NEUTROABS 2.5  --   --   --   HGB 5.2*   < > 7.1* 9.7*  HCT 20.5*   < > 25.3* 32.5*  MCV 75.4*   < > 78.6* 76.7*  PLT 277   < > 237 224    < > = values in this interval not displayed.   Recent Labs    02/06/20 1848  LABPROT 13.1  INR 1.0    Impression: Anemia with FOBT positive stool.  Suspect bleeding from gastric and/or small bowel AVMs. -Hgb 9.7 today, improved from 7.1 after 2u pRBCs (patient has received 4u pRBCs since admission, Hgb 5.2 on arrival 7/19) -BUN 18/ Cr 1.21 -INR 1.0 as of 7/19 -Hemodynamically stable (normotensive, normal HR)  History of CVA on Plavix, last dose 02/06/20  Dementia  Plan: EGD tomorrow 7/22.  Due to Plavix use and current hemodynamic stability, we do not plan to complete EGD sooner unless destabilizing bleeding occurs.  Dr. Paulita Fujita discussed options with patient's POA and brother Dellis Filbert, who is in agreement to proceed with EGD.  Also discussed with patient today, and patient is willing to proceed.  Continue Protonix IV BID.  Continue to hold Plavix.  Continue to monitor H&H with transfusion as needed to maintain Hgb >7.  [facility] will follow.  Salley Slaughter PA-C 02/08/2020, 10:46 AM  Contact #  [phone]

## 2020-02-08 NOTE — H&P (View-Only) (Signed)
[facility] Gastroenterology Progress Note  Karl Miller 64 y.o. 11-Sep-1955  CC:  Anemia and FOBT positive stools  Subjective: Patient feels "fine" today. Denies any abdominal pain, nausea, or vomiting.   Two mushy bowel movements this morning per flow sheet; color not documented.  Patient more oriented today.  Able to report location and reason for admission (bleeding).  Unable to report year.  ROS : Review of Systems  Cardiovascular: Negative for chest pain and palpitations.  Gastrointestinal: Negative for abdominal pain, blood in stool, constipation, diarrhea, heartburn, melena, nausea and vomiting.    Objective: Vital signs in last 24 hours: Vitals:   02/08/20 0348 02/08/20 0525  BP: (!) 153/95 (!) 164/87  Pulse: (!) 58 (!) 51  Resp: 19 18  Temp: 98.2 F (36.8 C) 98 F (36.7 C)  SpO2: 100% 99%    Physical Exam:  General:  Alert, cooperative, no distress, oriented to self, location, and situation  Head:  Normocephalic, without obvious abnormality, atraumatic  Eyes:  Mild conjunctival pallor, EOMs intact   Lungs:   Clear to auscultation bilaterally, respirations unlabored  Heart:  Mildly bradycardic with regular rate  Abdomen:   Soft, non-tender, non-distended, bowel sounds active all four quadrants, no guarding or peritoneal signs  Extremities: Extremities normal, atraumatic, no  edema  Pulses: 2+ and symmetric    Lab Results: Recent Labs    02/07/20 0910 02/08/20 0700  NA 144 138  K 4.2 3.8  CL 112* 107  CO2 26 22  GLUCOSE 86 115*  BUN 23 18  CREATININE 1.16 1.21  CALCIUM 9.5 9.3   Recent Labs    02/06/20 1848 02/07/20 0910  AST 19 17  ALT 20 18  ALKPHOS 52 36*  BILITOT <0.1* 0.3  PROT 7.5 6.6  ALBUMIN 3.9 3.5   Recent Labs    02/06/20 1848 02/07/20 0248 02/07/20 0910 02/08/20 0901  WBC 3.7*   < > 4.7 5.7  NEUTROABS 2.5  --   --   --   HGB 5.2*   < > 7.1* 9.7*  HCT 20.5*   < > 25.3* 32.5*  MCV 75.4*   < > 78.6* 76.7*  PLT 277   < > 237 224    < > = values in this interval not displayed.   Recent Labs    02/06/20 1848  LABPROT 13.1  INR 1.0    Impression: Anemia with FOBT positive stool.  Suspect bleeding from gastric and/or small bowel AVMs. -Hgb 9.7 today, improved from 7.1 after 2u pRBCs (patient has received 4u pRBCs since admission, Hgb 5.2 on arrival 7/19) -BUN 18/ Cr 1.21 -INR 1.0 as of 7/19 -Hemodynamically stable (normotensive, normal HR)  History of CVA on Plavix, last dose 02/06/20  Dementia  Plan: EGD tomorrow 7/22.  Due to Plavix use and current hemodynamic stability, we do not plan to complete EGD sooner unless destabilizing bleeding occurs.  Dr. Paulita Fujita discussed options with patient's POA and brother Dellis Filbert, who is in agreement to proceed with EGD.  Also discussed with patient today, and patient is willing to proceed.  Continue Protonix IV BID.  Continue to hold Plavix.  Continue to monitor H&H with transfusion as needed to maintain Hgb >7.  [facility] will follow.  Salley Slaughter PA-C 02/08/2020, 10:46 AM  Contact #  [phone]

## 2020-02-08 NOTE — Progress Notes (Signed)
PROGRESS NOTE  Karl Miller ZOX:[id number] DOB: 08/10/1955 DOA: 02/06/2020 PCP: [facility]  Brief History   44yom PMH dementia, resident in memory care unit, angiodysplasia, recurrent GIB brought to ED after outpt bloodwork showed Hgb 5.2.  A & P  Symptomatic anemia secondary to GIB, complicated by Plavis. PMH angiodysplasia/small bowel AVMs --Hgb up appropriately s/p 2 add'l units PRBC (total 4) --no bleeding reported --continue PPI. Plavix on hold.  Vascular dementia  --stable, continue Aricept  Disposition Plan:  Discussion: No evidence of bleeding.  Hemodynamic stable.  Hemoglobin appropriately increased status post total 4 units PRBC.  Plan for EGD 7/22 per GI.  Anticipate discharge once cleared by GI.  Status is: Inpatient  Remains inpatient appropriate because:Ongoing diagnostic testing needed not appropriate for outpatient work up   Dispo: The patient is from: ALF              Anticipated d/c is to: [facility]              Anticipated d/c date is: 1 day              Patient currently is not medically stable to d/c.  DVT prophylaxis:  SCDs Start: 02/06/20 2137 Code Status: Full Family Communication: mother present at bedside  Murray Hodgkins, MD  [facility] Direct contact: see [email or web address] (further directions at bottom of note if needed) 7PM-7AM contact night coverage as at bottom of note 02/08/2020, 4:42 PM  LOS: 2 days   Significant Hospital Events   .    Consults:  . GI   Procedures:  .   Significant Diagnostic Tests:  Marland Kitchen    Micro Data:  .    Antimicrobials:  .   Interval History/Subjective  Feels fine today, no complaints, no n/v. No pain. Mother at bedside.  Objective   Vitals:  Vitals:   02/08/20 0525 02/08/20 1450  BP: (!) 164/87 (!) 156/98  Pulse: (!) 51 (!) 55  Resp: 18 20  Temp: 98 F (36.7 C) 98.1 F (36.7 C)  SpO2: 99% 100%    Exam:  Constitutional:   . Appears calm and  comfortable Respiratory:  . CTA bilaterally, no w/r/r.  . Respiratory effort normal.  Cardiovascular:  . RRR, no m/r/g . No LE extremity edema   Abdomen:  . Soft, ntnd Psychiatric:  . Mental status o Mood, affect appropriate  I have personally reviewed the following:   Today's Data  . Hgb up to 9.7 appropriately s/p 2 additional units PRBC  Scheduled Meds: . atorvastatin  40 mg Oral Daily  . donepezil  10 mg Oral QAC breakfast  . finasteride  5 mg Oral Daily  . pantoprazole (PROTONIX) IV  40 mg Intravenous Q12H   Continuous Infusions:   Principal Problem:   Symptomatic anemia Active Problems:   HTN (hypertension)   Hyperlipidemia   GI bleed   Angiodysplasia of intestine with hemorrhage   LOS: 2 days   How to contact the [facility] Attending or Consulting provider 7A - 7P or covering provider during after hours [place], for this patient?  1. Check the care team in [facility] and look for a) attending/consulting TRH provider listed and b) the [facility] team listed 2. Log into [email or web address] and use [place]'s universal password to access. If you do not have the password, please contact the hospital operator. 3. Locate the [facility] provider you are looking for under [facility] and page to a number that you can be directly reached. 4.  If you still have difficulty reaching the provider, please page the [facility] (Director on Call) for the Hospitalists listed on amion for assistance.

## 2020-02-08 NOTE — TOC Progression Note (Signed)
Transition of Care [facility]) - Progression Note    Patient Details  Name: Karl Miller MRN: [id number] Date of Birth: 25-Jan-1956  Transition of Care [facility]) CM/SW Contact  Ross Ludwig, [place] Phone Number: 02/08/2020, 9:27 AM  Clinical Narrative:     Patient is from [place].        Expected Discharge Plan and Services                                                 Social Determinants of Health (SDOH) Interventions    Readmission Risk Interventions No flowsheet data found.

## 2020-02-09 ENCOUNTER — Inpatient Hospital Stay (HOSPITAL_COMMUNITY): Payer: No Typology Code available for payment source | Admitting: Anesthesiology

## 2020-02-09 ENCOUNTER — Encounter (HOSPITAL_COMMUNITY): Payer: Self-pay | Admitting: Internal Medicine

## 2020-02-09 ENCOUNTER — Encounter (HOSPITAL_COMMUNITY)
Admission: EM | Disposition: A | Discharge status: Skilled Nursing Facility | Payer: Self-pay | Source: Home / Self Care | Attending: Family Medicine

## 2020-02-09 DIAGNOSIS — F0280 Dementia in other diseases classified elsewhere without behavioral disturbance: Secondary | ICD-10-CM

## 2020-02-09 DIAGNOSIS — K922 Gastrointestinal hemorrhage, unspecified: Secondary | ICD-10-CM

## 2020-02-09 DIAGNOSIS — G309 Alzheimer's disease, unspecified: Secondary | ICD-10-CM

## 2020-02-09 HISTORY — PX: ESOPHAGOGASTRODUODENOSCOPY: SHX5428

## 2020-02-09 LAB — TYPE AND SCREEN
ABO/RH(D): O NEG
Antibody Screen: POSITIVE
Donor AG Type: NEGATIVE
Donor AG Type: NEGATIVE
Donor AG Type: NEGATIVE
Donor AG Type: NEGATIVE
Unit division: 0
Unit division: 0
Unit division: 0
Unit division: 0

## 2020-02-09 LAB — BPAM RBC
Blood Product Expiration Date: 202108112359
Blood Product Expiration Date: 202108152359
Blood Product Expiration Date: 202108182359
Blood Product Expiration Date: 202108182359
ISSUE DATE / TIME: 202107200328
ISSUE DATE / TIME: 202107200628
ISSUE DATE / TIME: 202107201656
ISSUE DATE / TIME: 202107210150
Unit Type and Rh: 9500
Unit Type and Rh: 9500
Unit Type and Rh: 9500
Unit Type and Rh: 9500

## 2020-02-09 LAB — CBC
HCT: 31.8 % — ABNORMAL LOW (ref 39.0–52.0)
Hemoglobin: 9.3 g/dL — ABNORMAL LOW (ref 13.0–17.0)
MCH: 22.6 pg — ABNORMAL LOW (ref 26.0–34.0)
MCHC: 29.2 g/dL — ABNORMAL LOW (ref 30.0–36.0)
MCV: 77.4 fL — ABNORMAL LOW (ref 80.0–100.0)
Platelets: 241 10*3/uL (ref 150–400)
RBC: 4.11 MIL/uL — ABNORMAL LOW (ref 4.22–5.81)
RDW: 19.1 % — ABNORMAL HIGH (ref 11.5–15.5)
WBC: 7.3 10*3/uL (ref 4.0–10.5)
nRBC: 0 % (ref 0.0–0.2)

## 2020-02-09 SURGERY — EGD (ESOPHAGOGASTRODUODENOSCOPY)
Anesthesia: Monitor Anesthesia Care

## 2020-02-09 MED ORDER — PROPOFOL 10 MG/ML IV BOLUS
INTRAVENOUS | Status: DC | PRN
  Administered 2020-02-09: 20 mg via INTRAVENOUS

## 2020-02-09 MED ORDER — LACTATED RINGERS IV SOLN
INTRAVENOUS | Status: DC
  Administered 2020-02-09: 10 mL via INTRAVENOUS

## 2020-02-09 MED ORDER — PROPOFOL 500 MG/50ML IV EMUL
INTRAVENOUS | Status: AC
  Filled 2020-02-09: qty 50

## 2020-02-09 MED ORDER — PROPOFOL 500 MG/50ML IV EMUL
INTRAVENOUS | Status: DC | PRN
  Administered 2020-02-09: 100 ug/kg/min via INTRAVENOUS

## 2020-02-09 MED ORDER — SODIUM CHLORIDE 0.9 % IV SOLN: INTRAVENOUS | Status: DC

## 2020-02-09 NOTE — Op Note (Signed)
[facility] Patient Name: Karl Miller Procedure Date: 02/09/2020 MRN: [id number] Attending MD: Arta Silence , MD Date of Birth: 07-27-1955 CSN: [id number] Age: 64 Admit Type: Inpatient Procedure:                Upper GI endoscopy Indications:              Acute post hemorrhagic anemia, hemoccult positive                            stool Providers:                Arta Silence, MD, Angus Seller, Cherylynn Ridges,                            Technician, Anne Fu CRNA, CRNA Referring MD:             [facility] Medicines:                Monitored Anesthesia Care Complications:            No immediate complications. Estimated Blood Loss:     Estimated blood loss: none. Procedure:                Pre-Anesthesia Assessment:                           - Prior to the procedure, a History and Physical                            was performed, and patient medications and                            allergies were reviewed. The patient's tolerance of                            previous anesthesia was also reviewed. The risks                            and benefits of the procedure and the sedation                            options and risks were discussed with the patient.                            All questions were answered, and informed consent                            was obtained. Prior Anticoagulants: The patient has                            taken Plavix (clopidogrel), last dose was 3 days                            prior to procedure. ASA Grade Assessment: III - A  patient with severe systemic disease. After                            reviewing the risks and benefits, the patient was                            deemed in satisfactory condition to undergo the                            procedure.                           After obtaining informed consent, the endoscope was                            passed under direct  vision. Throughout the                            procedure, the patient's blood pressure, pulse, and                            oxygen saturations were monitored continuously. The                            GIF-H190 ([id number]) Olympus gastroscope was                            introduced through the mouth, and advanced to the                            second part of duodenum. The upper GI endoscopy was                            accomplished without difficulty. The patient                            tolerated the procedure well. Scope In: Scope Out: Findings:      The examined esophagus was normal.      A single diminutive angioectasia with no bleeding was found in the       gastric fundus.      Hemoclip noted in body of stomach, presumably from prior endoscopic       intervention.      The exam of the stomach was otherwise normal.      The duodenal bulb, first portion of the duodenum and second portion of       the duodenum were normal.      No old or fresh blood was seen to the extent of our examination. Impression:               - Normal esophagus.                           - A single non-bleeding angioectasia in the stomach.                           -  Normal duodenal bulb, first portion of the                            duodenum and second portion of the duodenum.                           - No source of anemia or bleeding identified on                            today's endoscopy. Moderate Sedation:      Not Applicable - Patient had care per Anesthesia. Recommendation:           - Return patient to hospital ward for ongoing care.                           - Mechanical soft diet today.                           - To visualize the small bowel, perform video                            capsule endoscopy tomorrow.                           [facility] will follow. Case reviewed with patient                            and his brother Karl Miller, [place]). Procedure Code(s):        ---  Professional ---                           [phone], Esophagogastroduodenoscopy, flexible,                            transoral; diagnostic, including collection of                            specimen(s) by brushing or washing, when performed                            (separate procedure) Diagnosis Code(s):        --- Professional ---                           V03.500, Angiodysplasia of stomach and duodenum                            without bleeding                           D62, Acute posthemorrhagic anemia                           K92.1, Melena (includes Hematochezia) CPT copyright 2019 American Medical Association. All rights reserved. The codes documented in this report are preliminary and upon coder review may  be revised to meet current  compliance requirements. Arta Silence, MD 02/09/2020 12:00:56 PM This report has been signed electronically. Number of Addenda: 0

## 2020-02-09 NOTE — Anesthesia Postprocedure Evaluation (Signed)
Anesthesia Post Note  Patient: Karl Miller  Procedure(s) Performed: ESOPHAGOGASTRODUODENOSCOPY (EGD) (N/A )     Patient location during evaluation: PACU Anesthesia Type: MAC Level of consciousness: awake and alert Pain management: pain level controlled Vital Signs Assessment: post-procedure vital signs reviewed and stable Respiratory status: spontaneous breathing, nonlabored ventilation, respiratory function stable and patient connected to nasal cannula oxygen Cardiovascular status: stable and blood pressure returned to baseline Postop Assessment: no apparent nausea or vomiting Anesthetic complications: no   No complications documented.  Last Vitals:  Vitals:   02/09/20 1153 02/09/20 1200  BP: (!) 154/87 (!) 155/95  Pulse: 52 58  Resp: 15 15  Temp:    SpO2: 100% 100%    Last Pain:  Vitals:   02/09/20 1123  TempSrc: Axillary  PainSc: 0-No pain                 [name]

## 2020-02-09 NOTE — Progress Notes (Signed)
PROGRESS NOTE  Karl Miller LOV:[id number] DOB: 1956/01/23 DOA: 02/06/2020 [facility]  Brief History   50yom PMH dementia, resident in memory care unit, angiodysplasia, recurrent GIB brought to ED after outpt bloodwork showed Hgb 5.2.  A & P  Symptomatic anemia secondary to GIB, complicated by Plavis. PMH angiodysplasia/small bowel AVMs --Hgb stable s/p total 4 units PRBC   --no bleeding reported. EGD unrevealing. Capsule endoscopy tomorrow. --continue PPI. Plavix on hold.  Vascular dementia  --appears stable, continue Aricept  Disposition Plan:  Discussion: no bleeding noted; Hgb stable. EGD unrevealing, plan fo capsule endoscopy tomorrow. Anticipate discharge once cleared by GI.  Status is: Inpatient  Remains inpatient appropriate because:Ongoing diagnostic testing needed not appropriate for outpatient work up   Dispo: The patient is from: ALF              Anticipated d/c is to: [place]              Anticipated d/c date is: 1 day              Patient currently is not medically stable to d/c.  DVT prophylaxis:  SCDs Start: 02/06/20 2137 Code Status: Full Family Communication: none present  Murray Hodgkins, MD  [facility] Direct contact: see [email or web address] (further directions at bottom of note if needed) 7PM-7AM contact night coverage as at bottom of note 02/09/2020, 3:41 PM  LOS: 3 days   Significant Hospital Events       Consults:   GI   Procedures:   7/22 EGD unremarkable, no source of bleeding found; one gastric angioectasia  Significant Diagnostic Tests:      Micro Data:      Antimicrobials:     Interval History/Subjective  Feels ok, no complaints.  Objective   Vitals:  Vitals:   02/09/20 1220 02/09/20 1240  BP: (!) 148/89 (!) 159/94  Pulse: 51 46  Resp: 15 14  Temp:  98.2 F (36.8 C)  SpO2: 100% 100%    Exam:  Constitutional:    Appears calm and comfortable Respiratory:   CTA bilaterally, no  w/r/r.   Respiratory effort normal.  Cardiovascular:   RRR, no m/r/g  No LE extremity edema   Psychiatric:   Mental status o Mood, affect appropriate  I have personally reviewed the following:   Today's Data   Hgb stable at 9.3  Scheduled Meds:  atorvastatin  40 mg Oral Daily   donepezil  10 mg Oral QAC breakfast   finasteride  5 mg Oral Daily   pantoprazole (PROTONIX) IV  40 mg Intravenous Q12H   Continuous Infusions:   Principal Problem:   Symptomatic anemia Active Problems:   HTN (hypertension)   Hyperlipidemia   GI bleed   Angiodysplasia of intestine with hemorrhage   LOS: 3 days   How to contact the [facility] Attending or Consulting provider 7A - 7P or covering provider during after hours [place], for this patient?  1. Check the care team in [facility] and look for a) attending/consulting TRH provider listed and b) the [facility] team listed 2. Log into [email or web address] and use [place]'s universal password to access. If you do not have the password, please contact the hospital operator. 3. Locate the [facility] provider you are looking for under [facility] and page to a number that you can be directly reached. 4. If you still have difficulty reaching the provider, please page the [facility] (Director on Call) for the Hospitalists listed on amion for assistance.

## 2020-02-09 NOTE — Transfer of Care (Signed)
Immediate Anesthesia Transfer of Care Note  Patient: Karl Miller  Procedure(s) Performed: Procedure(s): ESOPHAGOGASTRODUODENOSCOPY (EGD) (N/A)  Patient Location: PACU  Anesthesia Type:MAC  Level of Consciousness:  sedated, patient cooperative and responds to stimulation  Airway & Oxygen Therapy:Patient Spontanous Breathing and Patient connected to face mask oxgen  Post-op Assessment:  Report given to PACU RN and Post -op Vital signs reviewed and stable  Post vital signs:  Reviewed and stable  Last Vitals:  Vitals:   02/09/20 0950 02/09/20 0958  BP: (!) 151/104 (!) 176/110  Pulse: 62   Resp: 16 17  Temp: 36.6 C 36.9 C  SpO2: 409% 811%    Complications: No apparent anesthesia complications

## 2020-02-09 NOTE — Anesthesia Preprocedure Evaluation (Addendum)
Anesthesia Evaluation  Patient identified by MRN, date of birth, ID band Patient confused    Reviewed: Allergy & Precautions, NPO status , Patient's Chart, lab work & pertinent test results, Unable to perform ROS - Chart review only  Airway Mallampati: II  TM Distance: >3 FB Neck ROM: Full    Dental no notable dental hx.    Pulmonary Current Smoker,    Pulmonary exam normal breath sounds clear to auscultation       Cardiovascular hypertension, Normal cardiovascular exam Rhythm:Regular Rate:Normal     Neuro/Psych Dementia TIACVA    GI/Hepatic negative GI ROS, Neg liver ROS,   Endo/Other  negative endocrine ROS  Renal/GU negative Renal ROS  negative genitourinary   Musculoskeletal negative musculoskeletal ROS (+)   Abdominal   Peds negative pediatric ROS (+)  Hematology  (+) anemia ,   Anesthesia Other Findings   Reproductive/Obstetrics negative OB ROS                            Anesthesia Physical Anesthesia Plan  ASA: III  Anesthesia Plan: MAC   Post-op Pain Management:    Induction: Intravenous  PONV Risk Score and Plan:   Airway Management Planned: Simple Face Mask  Additional Equipment:   Intra-op Plan:   Post-operative Plan:   Informed Consent: I have reviewed the patients History and Physical, chart, labs and discussed the procedure including the risks, benefits and alternatives for the proposed anesthesia with the patient or authorized representative who has indicated his/her understanding and acceptance.     Dental advisory given  Plan Discussed with: CRNA and Surgeon  Anesthesia Plan Comments:         Anesthesia Quick Evaluation

## 2020-02-09 NOTE — Evaluation (Signed)
Physical Therapy Evaluation Patient Details Name: Karl Miller MRN: [id number] DOB: 1956-02-07 Today's Date: 02/09/2020   History of Present Illness  Pt is 64 yo male with PMH including dementia, CVA, angiodysplasia, and recurrent GIB.  Pt brought to ED after outpt bloodwork showed Hgb of 5.2.  Pt is s/p transfusion of 4 units PRBC and EGD that was unrevealing.  Clinical Impression  Pt admitted with above diagnosis. Pt presents with mild deficits in mobility and balance compared to his baseline.  He required multimodal cues for sequencing with transfers.  Pt was able to ambulate 150' with min guard for safety.   Pt currently with functional limitations due to the deficits listed below (see PT Problem List). Pt will benefit from skilled PT to increase their independence and safety with mobility to allow discharge to the venue listed below.       Follow Up Recommendations Other (comment) (return to ALF memory unit)    Equipment Recommendations  None recommended by PT    Recommendations for Other Services       Precautions / Restrictions Precautions Precautions: Fall      Mobility  Bed Mobility Overal bed mobility: Needs Assistance Bed Mobility: Supine to Sit     Supine to sit: Min assist     General bed mobility comments: Difficulty sequencing requiring increased time and cues to get to EOB  Transfers Overall transfer level: Needs assistance Equipment used: Rolling walker (2 wheeled) Transfers: Sit to/from Stand Sit to Stand: Min assist         General transfer comment: cues for safe hand placement  Ambulation/Gait Ambulation/Gait assistance: Min guard Gait Distance (Feet): 150 Feet Assistive device: Rolling walker (2 wheeled) Gait Pattern/deviations: Narrow base of support;Decreased stride length;Decreased dorsiflexion - left Gait velocity: decreased   General Gait Details: mild unsteadiness but no overt LOB: cues for RW with turns  Karl Miller (Stroke Patients Only)       Balance Overall balance assessment: Needs assistance Sitting-balance support: No upper extremity supported Sitting balance-Leahy Scale: Good     Standing balance support: Bilateral upper extremity supported Standing balance-Leahy Scale: Poor Standing balance comment: required RW                             Pertinent Vitals/Pain Pain Assessment: No/denies pain    Home Living Family/patient expects to be discharged to:: Assisted living (memory care unit)               Home Equipment: Walker - 2 wheels      Prior Function Level of Independence: Needs assistance   Gait / Transfers Assistance Needed: Pt reports using RW - per prior admission this is correct  ADL's / Homemaking Assistance Needed: Per prior admission pt had some assist with ADLs  Comments: Pt is from a memory care unit (per prior admission and this admission notes) and was able to ambulate on his own with RW per prior admission (pt states he lives with brother)     Engineer, manufacturing [employer]        Extremity/Trunk Assessment   Upper Extremity Assessment Upper Extremity Assessment: Overall WFL for tasks assessed    Lower Extremity Assessment Lower Extremity Assessment: Overall WFL for tasks assessed    Cervical / Trunk Assessment Cervical / Trunk Assessment: Normal  Communication   Communication: No difficulties  Cognition  Arousal/Alertness: Awake/alert Behavior During Therapy: WFL for tasks assessed/performed Overall Cognitive Status: History of cognitive impairments - at baseline                                 General Comments: Oriented to self, place, and situation; unable to provide accurate PLOF answers based on recent admission; pleasant and followed commands      General Comments General comments (skin integrity, edema, etc.): VSS    Exercises     Assessment/Plan    PT Assessment Patient  needs continued PT services  PT Problem List Decreased strength;Decreased mobility;Decreased safety awareness;Decreased coordination;Decreased activity tolerance;Decreased balance;Decreased knowledge of use of DME;Decreased cognition       PT Treatment Interventions DME instruction;Therapeutic activities;Gait training;Therapeutic exercise;Patient/family education;Functional mobility training;Balance training    PT Goals (Current goals can be found in the Care Plan section)  Acute Rehab PT Goals Patient Stated Goal: unable PT Goal Formulation: Patient unable to participate in goal setting Time For Goal Achievement: 02/22/20 Potential to Achieve Goals: Good    Frequency Min 3X/week   Barriers to discharge        Co-evaluation               AM-PAC PT "6 Clicks" Mobility  Outcome Measure Help needed turning from your back to your side while in a flat bed without using bedrails?: A Little Help needed moving from lying on your back to sitting on the side of a flat bed without using bedrails?: A Little Help needed moving to and from a bed to a chair (including a wheelchair)?: A Little Help needed standing up from a chair using your arms (e.g., wheelchair or bedside chair)?: A Little Help needed to walk in hospital room?: A Little Help needed climbing 3-5 steps with a railing? : A Little 6 Click Score: 18    End of Session Equipment Utilized During Treatment: Gait belt Activity Tolerance: Patient tolerated treatment well Patient left: with chair alarm set;in chair;with call bell/phone within reach Nurse Communication: Mobility status PT Visit Diagnosis: Unsteadiness on feet (R26.81);Other abnormalities of gait and mobility (R26.89)    Time: 1117-3567 PT Time Calculation (min) (ACUTE ONLY): 20 min   Charges:   PT Evaluation $PT Eval Low Complexity: 1 Low          Karl Miller, PT Acute Rehab Services Pager [phone] [facility] [phone]    Karl Miller 02/09/2020, 5:08 PM

## 2020-02-09 NOTE — Interval H&P Note (Signed)
History and Physical Interval Note:  02/09/2020 10:46 AM  Karl Miller  has presented today for surgery, with the diagnosis of anemia, history of gastric and small bowel AVMs.  The various methods of treatment have been discussed with the patient and family. After consideration of risks, benefits and other options for treatment, the patient has consented to  Procedure(s): ESOPHAGOGASTRODUODENOSCOPY (EGD) (N/A) as a surgical intervention.  The patient's history has been reviewed, patient examined, no change in status, stable for surgery.  I have reviewed the patient's chart and labs.  Questions were answered to the patient's satisfaction.     Landry Dyke

## 2020-02-10 ENCOUNTER — Encounter (HOSPITAL_COMMUNITY)
Admission: EM | Disposition: A | Discharge status: Skilled Nursing Facility | Payer: Self-pay | Source: Home / Self Care | Attending: Family Medicine

## 2020-02-10 ENCOUNTER — Encounter (HOSPITAL_COMMUNITY): Payer: Self-pay | Admitting: Gastroenterology

## 2020-02-10 DIAGNOSIS — R001 Bradycardia, unspecified: Secondary | ICD-10-CM

## 2020-02-10 HISTORY — PX: GIVENS CAPSULE STUDY: SHX5432

## 2020-02-10 LAB — CBC
HCT: 34.4 % — ABNORMAL LOW (ref 39.0–52.0)
Hemoglobin: 9.7 g/dL — ABNORMAL LOW (ref 13.0–17.0)
MCH: 22.3 pg — ABNORMAL LOW (ref 26.0–34.0)
MCHC: 28.2 g/dL — ABNORMAL LOW (ref 30.0–36.0)
MCV: 79.1 fL — ABNORMAL LOW (ref 80.0–100.0)
Platelets: 251 10*3/uL (ref 150–400)
RBC: 4.35 MIL/uL (ref 4.22–5.81)
RDW: 19.3 % — ABNORMAL HIGH (ref 11.5–15.5)
WBC: 6.7 10*3/uL (ref 4.0–10.5)
nRBC: 0 % (ref 0.0–0.2)

## 2020-02-10 SURGERY — IMAGING PROCEDURE, GI TRACT, INTRALUMINAL, VIA CAPSULE
Laterality: Left

## 2020-02-10 MED ORDER — LISINOPRIL 10 MG PO TABS
10.0000 mg | ORAL_TABLET | Freq: Every day | ORAL | Status: DC
  Administered 2020-02-10 – 2020-02-11 (×2): 10 mg via ORAL
  Filled 2020-02-10 (×2): qty 1

## 2020-02-10 NOTE — Progress Notes (Signed)
Patient confused at times and pulls at GI capsule leads as well as heart monitor leads.  Contacted Sandy, RN with GI to inform her that patient pulls leads off and to confirm that RN can tape them back on.  Lovey Newcomer said that would be ok and would try to send new leads for replacement.  Will continue to tape existing leads until replacements can be sent to floor.

## 2020-02-10 NOTE — Progress Notes (Addendum)
PROGRESS NOTE  Karl Miller:[id number] DOB: [birth year] DOA: 02/06/2020 [facility]  Brief History   59yom PMH dementia, resident in memory care unit, angiodysplasia, recurrent GIB brought to ED after outpt bloodwork showed Hgb 5.2.  A & P  Symptomatic anemia secondary to GIB, complicated by Plavix. PMH angiodysplasia/small bowel AVMs --Hgb remains stable s/p total 4 units PRBC   --no bleeding reported. EGD unrevealing. Capsule endoscopy today w/ results tomorrow. --continue PPI. Plavix on hold.  Vascular dementia  --appears stable, but will hold Aricept secondary to bradycardia  Disposition Plan:  Discussion: no bleeding noted; Hgb stable. Capsule endoscopy today. Anticipate discharge once cleared by GI.  Status is: Inpatient  Remains inpatient appropriate because:Ongoing diagnostic testing needed not appropriate for outpatient work up   Dispo: The patient is from: ALF              Anticipated d/c is to: [place]              Anticipated d/c date is: 1 day              Patient currently is not medically stable to d/c.  DVT prophylaxis:  SCDs Start: 02/06/20 2137 Code Status: Full Family Communication: brother [name] updated by telephone   Murray Hodgkins, MD  [facility] Direct contact: see [email or web address] (further directions at bottom of note if needed) 7PM-7AM contact night coverage as at bottom of note 02/10/2020, 5:15 PM  LOS: 4 days   Significant Hospital Events   .    Consults:  . GI   Procedures:  . 7/22 EGD unremarkable, no source of bleeding found; one gastric angioectasia  Significant Diagnostic Tests:  Marland Kitchen    Micro Data:  .    Antimicrobials:  .   Interval History/Subjective  No complaints.   Objective   Vitals:  Vitals:   02/10/20 0442 02/10/20 1425  BP: (!) 147/88 (!) 144/88  Pulse: 50 59  Resp: 16 18  Temp: (!) 97.5 F (36.4 C) (!) 97.5 F (36.4 C)  SpO2: 100% 100%    Exam:  Constitutional:    . Appears calm and comfortable Respiratory:  . CTA bilaterally, no w/r/r.  . Respiratory effort normal.  Cardiovascular:  . RRR, no m/r/g Psychiatric:  . Mental status o Mood, affect appropriate  I have personally reviewed the following:   Today's Data  . Hgb stable at 9.7  Scheduled Meds: . atorvastatin  40 mg Oral Daily  . finasteride  5 mg Oral Daily  . lisinopril  10 mg Oral Daily  . pantoprazole (PROTONIX) IV  40 mg Intravenous Q12H   Continuous Infusions:   Principal Problem:   Symptomatic anemia Active Problems:   HTN (hypertension)   Hyperlipidemia   GI bleed   Angiodysplasia of intestine with hemorrhage   LOS: 4 days   How to contact the [place] Attending or Consulting provider [place] or covering provider during after hours [place], for this patient?  1. Check the care team in [facility] and look for a) attending/consulting TRH provider listed and b) the [facility] team listed 2. Log into [email or web address] and use [place]'s universal password to access. If you do not have the password, please contact the hospital operator. 3. Locate the [facility] provider you are looking for under [facility] and page to a number that you can be directly reached. 4. If you still have difficulty reaching the provider, please page the [facility] (Director on Call) for the Hospitalists listed  on amion for assistance.

## 2020-02-10 NOTE — TOC Progression Note (Addendum)
Transition of Care [facility]) - Progression Note    Patient Details  Name: Karl Miller MRN: [id number] Date of Birth: January 21, 1956  Transition [facility] CM/SW Contact  Ross Ludwig, [place] Phone Number: 02/10/2020, 11:08 AM  Clinical Narrative:     CSW contacted [facility]F and spoke to the manager Roxanne, she said patient can return either today or Monday they will not have anyone available for admission over the weekend.  CSW updated bedside nurse and physician, patient is being followed by GI, and had a capsule study today, and results will not be ready till tomorrow.  CSW continuing to follow patient's progress throughout discharge planning.      Expected Discharge Plan and Services    Patient will be discharging back to [facility]F once they are able to accept patient.                                             Social Determinants of Health (SDOH) Interventions    Readmission Risk Interventions No flowsheet data found.

## 2020-02-10 NOTE — Progress Notes (Signed)
[facility] Gastroenterology Progress Note  Karl Miller 64 y.o. [birth year]  CC:  Anemia and FOBT positive stools  Subjective: Patient slightly more confused today than yesterday, sitting in bed watching ambulatory cardiac monitor and pressing buttons on the device.  States he is here for a "leak," and he is currently in a hotel.  Unable to report year.  Denies any abdominal pain, chest pain, or shortness of breath.  ROS : Review of Systems  Cardiovascular: Negative for chest pain and palpitations.  Gastrointestinal: Negative for abdominal pain, blood in stool, constipation, diarrhea, heartburn, melena, nausea and vomiting.   Objective: Vital signs in last 24 hours: Vitals:   02/09/20 2036 02/10/20 0442  BP: (!) 150/84 (!) 147/88  Pulse: 50 50  Resp: 15 16  Temp: 97.9 F (36.6 C) (!) 97.5 F (36.4 C)  SpO2: 100% 100%    Physical Exam:  General:  Alert, cooperative, no distress, oriented to self only this morning  Head:  Normocephalic, without obvious abnormality, atraumatic  Eyes:  Mild conjunctival pallor, EOMs intact   Lungs:   Clear to auscultation bilaterally, respirations unlabored  Heart:  Mildly bradycardic with regular rate  Abdomen:   Soft, non-tender, non-distended, bowel sounds active all four quadrants, no guarding or peritoneal signs  Extremities: Extremities normal, atraumatic, no  edema  Pulses: 2+ and symmetric    Lab Results: Recent Labs    02/07/20 0910 02/08/20 0700  NA 144 138  K 4.2 3.8  CL 112* 107  CO2 26 22  GLUCOSE 86 115*  BUN 23 18  CREATININE 1.16 1.21  CALCIUM 9.5 9.3   Recent Labs    02/07/20 0910  AST 17  ALT 18  ALKPHOS 36*  BILITOT 0.3  PROT 6.6  ALBUMIN 3.5   Recent Labs    02/09/20 0346 02/10/20 0326  WBC 7.3 6.7  HGB 9.3* 9.7*  HCT 31.8* 34.4*  MCV 77.4* 79.1*  PLT 241 251   No results for input(s): LABPROT, INR in the last 72 hours.  Impression: Anemia with FOBT positive stool.  Suspect bleeding from gastric  and/or small bowel AVMs, though EGD 7/22 was unrevealing for source of bleeding -Hgb 9.7 today, stable as compared to 9.3 yesterday (Improved from Hgb 5.2 on arrival 7/19 after a total of 4u pRBCs) -Hemodynamically stable (normotensive, normal HR)  History of CVA on Plavix, last dose 02/06/20  Dementia  Plan: Capsule endoscopy today.  Continue Protonix IV BID.  Continue to hold Plavix.  Continue to monitor H&H with transfusion as needed to maintain Hgb >7.  [facility] will follow.  Salley Slaughter PA-C 02/10/2020, 9:06 AM  Contact #  [phone]

## 2020-02-10 NOTE — Progress Notes (Signed)
Patient swallowed capsule pill at 0825 with no issues and tolerated well. Patient can have clear liquids at 1025, a light snack at 1225, and previous diet before swallowed pill capsule at 1725. Patient can remove pill capsule leads and place in patient belonging bag at 2025.

## 2020-02-11 ENCOUNTER — Encounter (HOSPITAL_COMMUNITY): Payer: Self-pay | Admitting: Gastroenterology

## 2020-02-11 LAB — SARS CORONAVIRUS 2 BY RT PCR (HOSPITAL ORDER, PERFORMED IN ~~LOC~~ HOSPITAL LAB): SARS Coronavirus 2: NEGATIVE

## 2020-02-11 NOTE — Discharge Summary (Signed)
Physician Discharge Summary  Karl Miller VQM:[id number] DOB: May 15, 1956 DOA: 02/06/2020  [facility]  Admit date: 02/06/2020 Discharge date: 02/11/2020  Recommendations for Outpatient Follow-up:   Symptomatic anemia secondary to GIB, complicated by Plavix. PMH angiodysplasia/small bowel AVMs. --continue PPI.  Okay to resume Plavix per GI. -Recommend oral iron supplements -Recommend double-balloon enteroscopy if evidence of further bleeding or ongoing anemia-which can be done as an outpatient.  Vascular dementia --Aricept discontinued secondary to bradycardia   Follow-up Information    [facility]. Schedule an appointment as soon as possible for a visit in 1 week(s).   Contact information: [place] [address] [phone]                Discharge Diagnoses: Principal diagnosis is #1 1. Symptomatic anemia secondary to GIB, complicated by Plavix.  2. Small bowel AVMs nonbleeding 3. Vascular dementia   Discharge Condition: improved Disposition: home  Diet recommendation: heart healthy  Filed Weights   02/06/20 1744  Weight: 72.6 kg    History of present illness:  64yom PMH dementia, resident in memory care unit, angiodysplasia, recurrent GIB brought to ED after outpt bloodwork showed Hgb 5.2.  Hospital Course:  Patient was transfused PRBCs with improvement in hemoglobin, demonstrated no bleeding.  Seen by gastroenterology, underwent EGD and capsule endoscopy which did not show a definite bleeding source.  Clinical condition remained stable and he was cleared for discharge by gastroenterology.  Individual issues as below.  Symptomatic anemia secondary to GIB, complicated by Plavix. PMH angiodysplasia/small bowel AVMs. --Hgb  remained stable s/p total 4 units PRBC with no evident bleeding  --EGD unrevealing. Capsule endoscopy  showed scattered small nonbleeding AVMs.  Cleared by gastroenterology for  discharge. --continue PPI.  Okay to resume Plavix per GI.Vascular dementia  --appears stable, but will hold Aricept secondary to bradycardia -Recommend oral iron supplements -Recommend double-balloon enteroscopy if evidence of further bleeding or ongoing anemia-which can be done as an outpatient.  Vascular dementia  --appears stable, but Aricept was discontinued secondary to bradycardia  Consults:   GI  Procedures:   7/22 EGD unremarkable, no source of bleeding found; one gastric angioectasia  723 video capsule endoscopy few, scattered, small nonbleeding AVMs throughout the small bowel.  No evidence of active bleeding.  Today's assessment: S: feels fine, no complaints O: Vitals:  Vitals:   02/11/20 0539 02/11/20 1337  BP: (!) 148/92 (!) 155/89  Pulse: 55 54  Resp: 19 16  Temp: 98.3 F (36.8 C) 98.3 F (36.8 C)  SpO2: 100% 100%    Constitutional:  . Appears calm and comfortable Respiratory:  . CTA bilaterally, no w/r/r.  . Respiratory effort normal.  Cardiovascular:  . RRR, no m/r/g Psychiatric:  . Mental status o Mood, affect appropriate  No new data  Discharge Instructions  Discharge Instructions    Diet - low sodium heart healthy   Complete by: As directed    Discharge instructions   Complete by: As directed    Call your physician or seek immediate medical attention for pain, bleeding, weakness, passing out or worsening of condition.   Increase activity slowly   Complete by: As directed      Allergies as of 02/11/2020   No Known Allergies     Medication List    STOP taking these medications   donepezil 10 MG tablet Commonly known as: ARICEPT     TAKE these medications   acetaminophen 500 MG tablet Commonly known as: TYLENOL Take 500-1,000 mg by  mouth every 8 (eight) hours as needed for mild pain.   atorvastatin 10 MG tablet Commonly known as: LIPITOR Take 4 tablets (40 mg total) by mouth daily.   ferrous sulfate 325 (65 FE) MG  tablet Take 325 mg by mouth 3 (three) times a week. Mon, Wed, Fri   finasteride 5 MG tablet Commonly known as: PROSCAR Take 5 mg by mouth daily.   guaiFENesin 100 MG/5ML Soln Commonly known as: ROBITUSSIN Take 10 mLs by mouth every 4 (four) hours as needed for cough or to loosen phlegm.   lisinopril 10 MG tablet Commonly known as: ZESTRIL Take 1 tablet (10 mg total) by mouth daily.   loperamide 2 MG capsule Commonly known as: IMODIUM Take 2 mg by mouth as needed for diarrhea or loose stools (not to exceed 8 doses in 24hrs).   magnesium hydroxide 400 MG/5ML suspension Commonly known as: MILK OF MAGNESIA Take 30 mLs by mouth at bedtime as needed for mild constipation.   Mylanta 200-200-20 MG/5ML suspension Generic drug: alum & mag hydroxide-simeth Take 30 mLs by mouth as needed for indigestion or heartburn.   neomycin-bacitracin-polymyxin Oint Commonly known as: NEOSPORIN Apply 1 application topically as needed for wound care.   pantoprazole 40 MG tablet Commonly known as: PROTONIX Take 40 mg by mouth daily.   Vitamin D (Ergocalciferol) 1.25 MG (50000 UNIT) Caps capsule Commonly known as: DRISDOL Take 50,000 Units by mouth every 7 (seven) days. Wednesday      No Known Allergies  The results of significant diagnostics from this hospitalization (including imaging, microbiology, ancillary and laboratory) are listed below for reference.    Significant Diagnostic Studies: No results found.  Microbiology: Recent Results (from the past 240 hour(s))  SARS Coronavirus 2 by RT PCR (hospital order, performed in [facility] hospital lab) Nasopharyngeal Nasopharyngeal Swab     Status: None   Collection Time: 02/06/20  6:48 PM   Specimen: Nasopharyngeal Swab  Result Value Ref Range Status   SARS Coronavirus 2 NEGATIVE NEGATIVE Final    Comment: (NOTE) SARS-CoV-2 target nucleic acids are NOT DETECTED.  The SARS-CoV-2 RNA is generally detectable in upper and lower respiratory  specimens during the acute phase of infection. The lowest concentration of SARS-CoV-2 viral copies this assay can detect is 250 copies / mL. A negative result does not preclude SARS-CoV-2 infection and should not be used as the sole basis for treatment or other patient management decisions.  A negative result may occur with improper specimen collection / handling, submission of specimen other than nasopharyngeal swab, presence of viral mutation(s) within the areas targeted by this assay, and inadequate number of viral copies (<250 copies / mL). A negative result must be combined with clinical observations, patient history, and epidemiological information.  Fact Sheet for Patients:   StrictlyIdeas.no  Fact Sheet for Healthcare Providers: BankingDealers.co.za  This test is not yet approved or  cleared by the [place] FDA and has been authorized for detection and/or diagnosis of SARS-CoV-2 by FDA under an Emergency Use Authorization (EUA).  This EUA will remain in effect (meaning this test can be used) for the duration of the COVID-19 declaration under Section 564(b)(1) of the Act, 21 U.S.C. section 360bbb-3(b)(1), unless the authorization is terminated or revoked sooner.  Performed at [facility], [place] [address], [place], [place] [address]      Labs: Basic Metabolic Panel: Recent Labs  Lab 02/06/20 1848 02/07/20 0910 02/08/20 0700  NA 146* 144 138  K 4.3 4.2  3.8  CL 109 112* 107  CO2 26 26 22   GLUCOSE 169* 86 115*  BUN 26* 23 18  CREATININE 1.34* 1.16 1.21  CALCIUM 9.9 9.5 9.3   Liver Function Tests: Recent Labs  Lab 02/06/20 1848 02/07/20 0910  AST 19 17  ALT 20 18  ALKPHOS 52 36*  BILITOT <0.1* 0.3  PROT 7.5 6.6  ALBUMIN 3.9 3.5   CBC: Recent Labs  Lab 02/06/20 1848 02/06/20 1848 02/07/20 0248 02/07/20 0910 02/08/20 0901 02/09/20 0346 02/10/20 0326  WBC 3.7*   < > 3.7* 4.7 5.7  7.3 6.7  NEUTROABS 2.5  --   --   --   --   --   --   HGB 5.2*   < > 4.4* 7.1* 9.7* 9.3* 9.7*  HCT 20.5*   < > 17.7* 25.3* 32.5* 31.8* 34.4*  MCV 75.4*   < > 75.3* 78.6* 76.7* 77.4* 79.1*  PLT 277   < > 243 237 224 241 251   < > = values in this interval not displayed.    Principal Problem:   Symptomatic anemia Active Problems:   HTN (hypertension)   Hyperlipidemia   GI bleed   Angiodysplasia of intestine with hemorrhage   Time coordinating discharge: 35 minutes  Signed:  Murray Hodgkins, MD  [facility]  02/11/2020, 3:39 PM

## 2020-02-11 NOTE — Progress Notes (Addendum)
CSW received a message from pt's provider who states pt's ALF [facility]) is saying [facility] Hiss) they can't take pt back this weekend.  2:24 PM CSW has repeatedly called for approx an hour and was finally able to reach the fourth floor [facility] [employer] who states she is in charge of the pt and can accept the pt back, and was expecting the pt to return today.  Shenette says to call [phone], ask for [facility] and the room # is TBD upon the pt returning to the 1st floor.  RN/EDP updated and pt will create the D/C packet and call PTAR when pt's RN is ready.  CSW will continue to follow for D/C needs.  Alphonse Guild. Ayliana Casciano  MSW, LCSW, LCAS, CCS Transitions of Care Clinical Social Worker Care Coordination Department Ph: [phone]

## 2020-02-11 NOTE — Progress Notes (Signed)
[facility] Gastroenterology Progress Note  Karl Miller 64 y.o. 24-May-1956  CC: Anemia, confusion   Subjective: Patient seen and examined at bedside.  Resting comfortably.  Denies abdominal pain, nausea or vomiting.  Denies any bleeding episodes.  ROS : Afebrile.  Negative for chest pain.   Objective: Vital signs in last 24 hours: Vitals:   02/10/20 2016 02/11/20 0539  BP: (!) 149/89 (!) 148/92  Pulse: 53 55  Resp: 20 19  Temp: 98.7 F (37.1 C) 98.3 F (36.8 C)  SpO2: 100% 100%    Physical Exam:  General.  Alert and cooperative.  Resting comfortably Chest.  No respiratory distress.  Anterior exam only Abdomen : Soft, nontender, nondistended, bowel sounds present.  No peritoneal signs Psych.  Mood and affect normal today  Lab Results: No results for input(s): NA, K, CL, CO2, GLUCOSE, BUN, CREATININE, CALCIUM, MG, PHOS in the last 72 hours. No results for input(s): AST, ALT, ALKPHOS, BILITOT, PROT, ALBUMIN in the last 72 hours.    Recent Labs    02/09/20 0346 02/10/20 0326  WBC 7.3 6.7  HGB 9.3* 9.7*  HCT 31.8* 34.4*  MCV 77.4* 79.1*  PLT 241 251   No results for input(s): LABPROT, INR in the last 72 hours.    Assessment/Plan: -Anemia with heme positive stool.  EGD July 22 - for active bleeding. -Blood loss anemia.  Hemoglobin stable.  Had EGD and colonoscopy in July 2020 as well as enteroscopy in January 2021. -History of CVA.  Plavix on hold. -Confusion and dementia  Recommendations ------------------------ -Follow capsule endoscopy results.  Hopefully capsule study will be downloaded and available to review today. -Hemoglobin is stable. -Continue current management -Further management based on capsule endoscopy findings.   Otis Brace MD, [place] 02/11/2020, 8:42 AM  Contact #  [phone]

## 2020-02-11 NOTE — Progress Notes (Signed)
This Probation officer spoke to [employer] from [phone], and gave report on Karl Miller. He will be discharged this evening.

## 2020-02-11 NOTE — Progress Notes (Signed)
-  Capsule endoscopy showed few, scattered, small nonbleeding AVMs throughout the small bowel.  No evidence of active bleeding.  Recommendations ----------------------- -Recommend oral iron supplements -Recommend double-balloon enteroscopy if evidence of further bleeding or ongoing anemia-which can be done as an outpatient. -Okay to resume Plavix from GI standpoint -No further inpatient GI work-up planned.  GI will sign off.  Call us back if needed  Otis Brace MD, [place] 02/11/2020, 12:09 PM  Contact #  [phone]

## 2020-02-11 NOTE — Progress Notes (Signed)
Shenette at [facility] ALF states COVID test result would be appreciated.  RN updated and is doing it now and agrees to call report with the results and print a copy of the results and add it to the D/C paperwork.  Provider updated and wil place SD/C Summary in chart now to be sent with pt.  RN updated via secure messaging.  CSW will continue to follow for D/C needs.  Alphonse Guild. Rikki Smestad  MSW, LCSW, LCAS, CCS Transitions of Care Clinical Social Worker Care Coordination Department Ph: [phone]

## 2020-06-20 IMAGING — DX DG CHEST 1V PORT
1 series · 1 of 1 positions shown · non-contrast
Comparison: December 04, 2017

CLINICAL DATA: Chest pain.  TKPIV-PN positive

EXAM:
PORTABLE CHEST 1 VIEW

[chest ap]
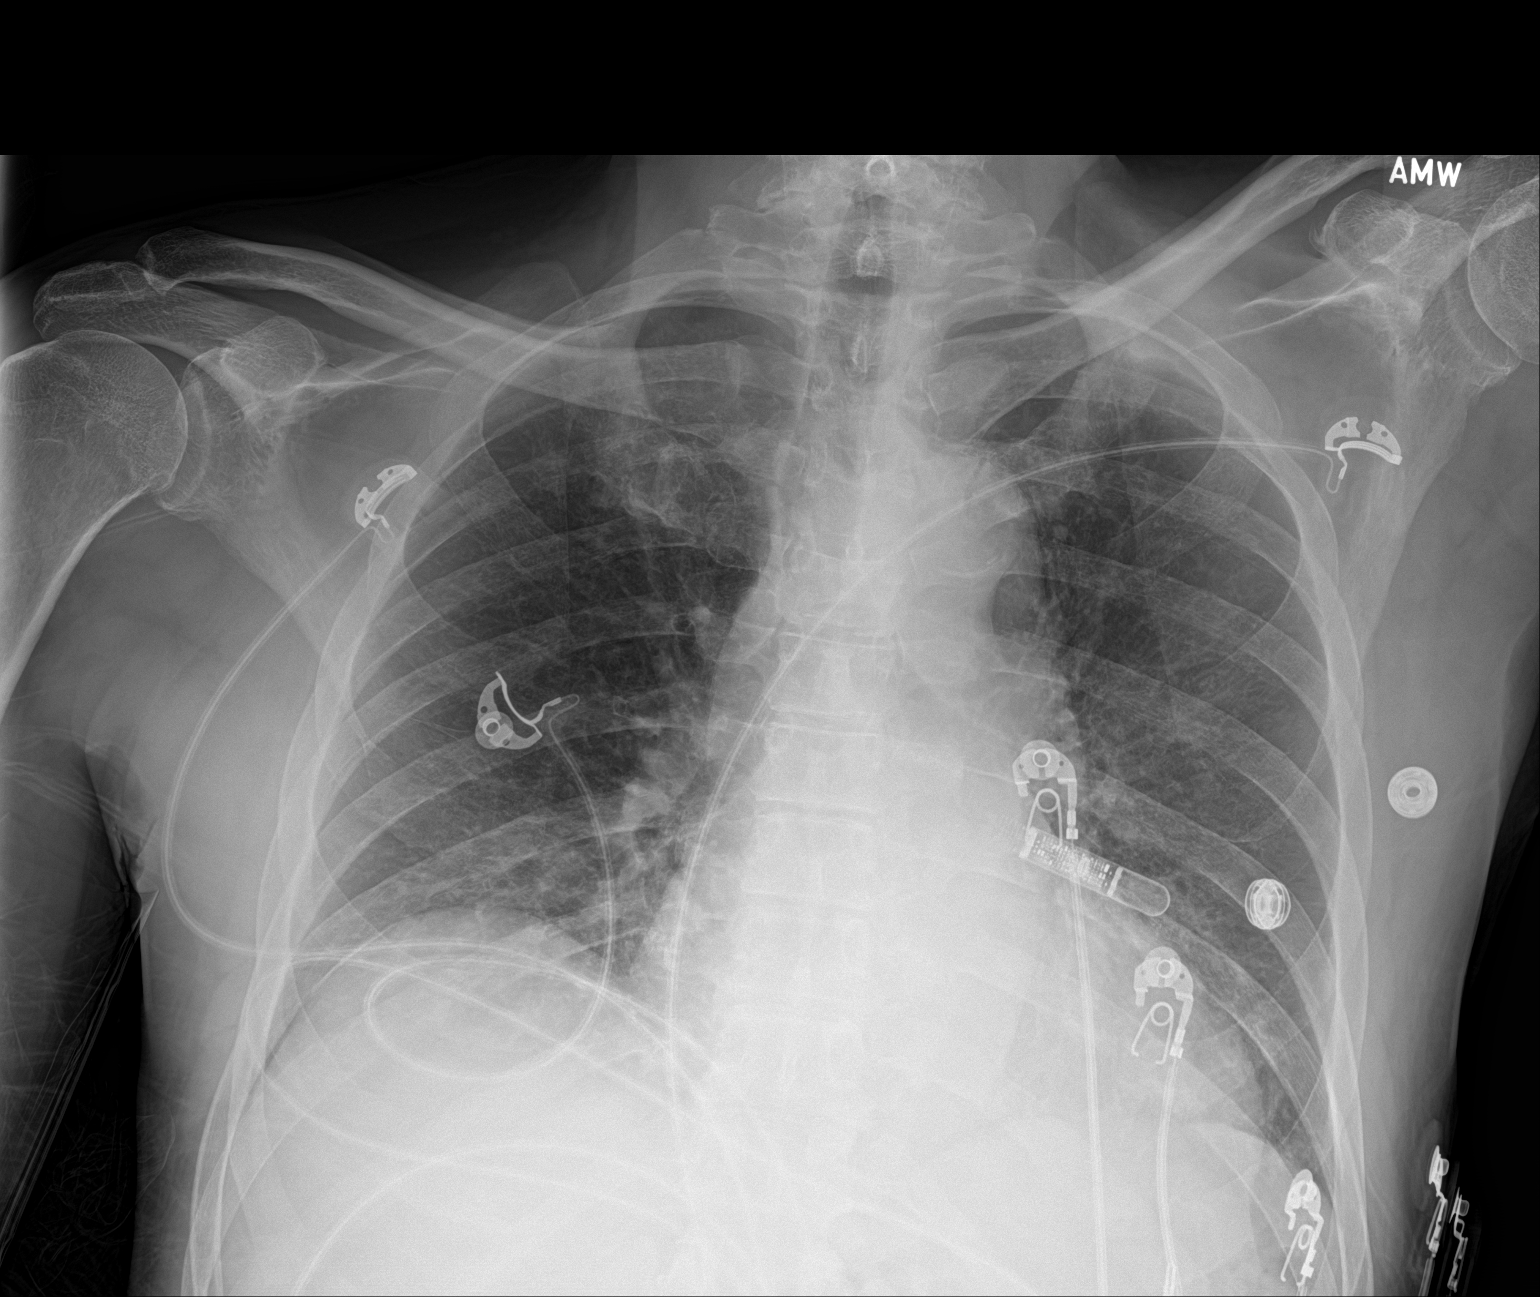

[1 of 1 positions shown; findings below may reference images not displayed]

FINDINGS: There is ill-defined opacity in the right base. Lungs elsewhere
clear. Heart is upper normal in size with pulmonary vascularity
normal. No adenopathy. No bone lesions. Loop recorder present on the
left. No pneumothorax. There is aortic atherosclerosis.
IMPRESSION: Ill-defined opacity right base, likely developing pneumonia,
potentially of atypical organism etiology. Lungs elsewhere clear.
Heart upper normal in size. No adenopathy. Aortic Atherosclerosis
([id number]).

## 2020-10-24 ENCOUNTER — Other Ambulatory Visit: Payer: Self-pay

## 2020-10-24 ENCOUNTER — Inpatient Hospital Stay (HOSPITAL_COMMUNITY)
Admission: EM | Admit: 2020-10-24 | Discharge: 2020-10-26 | DRG: 811 | Disposition: A | Discharge status: Skilled Nursing Facility | Payer: No Typology Code available for payment source | Source: Skilled Nursing Facility | Attending: Internal Medicine | Admitting: Internal Medicine

## 2020-10-24 DIAGNOSIS — E7800 Pure hypercholesterolemia, unspecified: Secondary | ICD-10-CM | POA: Diagnosis present

## 2020-10-24 DIAGNOSIS — D62 Acute posthemorrhagic anemia: Principal | ICD-10-CM | POA: Diagnosis present

## 2020-10-24 DIAGNOSIS — K5521 Angiodysplasia of colon with hemorrhage: Secondary | ICD-10-CM | POA: Diagnosis present

## 2020-10-24 DIAGNOSIS — K922 Gastrointestinal hemorrhage, unspecified: Secondary | ICD-10-CM | POA: Diagnosis present

## 2020-10-24 DIAGNOSIS — Z7902 Long term (current) use of antithrombotics/antiplatelets: Secondary | ICD-10-CM

## 2020-10-24 DIAGNOSIS — K2971 Gastritis, unspecified, with bleeding: Secondary | ICD-10-CM

## 2020-10-24 DIAGNOSIS — F17210 Nicotine dependence, cigarettes, uncomplicated: Secondary | ICD-10-CM | POA: Diagnosis present

## 2020-10-24 DIAGNOSIS — I639 Cerebral infarction, unspecified: Secondary | ICD-10-CM | POA: Diagnosis present

## 2020-10-24 DIAGNOSIS — N400 Enlarged prostate without lower urinary tract symptoms: Secondary | ICD-10-CM | POA: Diagnosis present

## 2020-10-24 DIAGNOSIS — Z8673 Personal history of transient ischemic attack (TIA), and cerebral infarction without residual deficits: Secondary | ICD-10-CM

## 2020-10-24 DIAGNOSIS — D649 Anemia, unspecified: Secondary | ICD-10-CM

## 2020-10-24 DIAGNOSIS — I10 Essential (primary) hypertension: Secondary | ICD-10-CM | POA: Diagnosis present

## 2020-10-24 DIAGNOSIS — N179 Acute kidney failure, unspecified: Secondary | ICD-10-CM | POA: Diagnosis present

## 2020-10-24 DIAGNOSIS — Z20822 Contact with and (suspected) exposure to covid-19: Secondary | ICD-10-CM | POA: Diagnosis present

## 2020-10-24 DIAGNOSIS — G309 Alzheimer's disease, unspecified: Secondary | ICD-10-CM | POA: Diagnosis present

## 2020-10-24 DIAGNOSIS — F0280 Dementia in other diseases classified elsewhere without behavioral disturbance: Secondary | ICD-10-CM | POA: Diagnosis present

## 2020-10-24 DIAGNOSIS — Z79899 Other long term (current) drug therapy: Secondary | ICD-10-CM

## 2020-10-24 DIAGNOSIS — F0150 Vascular dementia without behavioral disturbance: Secondary | ICD-10-CM | POA: Diagnosis present

## 2020-10-24 DIAGNOSIS — F05 Delirium due to known physiological condition: Secondary | ICD-10-CM | POA: Diagnosis present

## 2020-10-24 DIAGNOSIS — E785 Hyperlipidemia, unspecified: Secondary | ICD-10-CM | POA: Diagnosis present

## 2020-10-24 LAB — CBC WITH DIFFERENTIAL/PLATELET
Abs Immature Granulocytes: 0.01 10*3/uL (ref 0.00–0.07)
Basophils Absolute: 0 10*3/uL (ref 0.0–0.1)
Basophils Relative: 1 %
Eosinophils Absolute: 0.2 10*3/uL (ref 0.0–0.5)
Eosinophils Relative: 5 %
HCT: 26 % — ABNORMAL LOW (ref 39.0–52.0)
Hemoglobin: 7 g/dL — ABNORMAL LOW (ref 13.0–17.0)
Immature Granulocytes: 0 %
Lymphocytes Relative: 23 %
Lymphs Abs: 1 10*3/uL (ref 0.7–4.0)
MCH: 20.1 pg — ABNORMAL LOW (ref 26.0–34.0)
MCHC: 26.9 g/dL — ABNORMAL LOW (ref 30.0–36.0)
MCV: 74.7 fL — ABNORMAL LOW (ref 80.0–100.0)
Monocytes Absolute: 0.8 10*3/uL (ref 0.1–1.0)
Monocytes Relative: 17 %
Neutro Abs: 2.4 10*3/uL (ref 1.7–7.7)
Neutrophils Relative %: 54 %
Platelets: 295 10*3/uL (ref 150–400)
RBC: 3.48 MIL/uL — ABNORMAL LOW (ref 4.22–5.81)
RDW: 19.9 % — ABNORMAL HIGH (ref 11.5–15.5)
WBC: 4.4 10*3/uL (ref 4.0–10.5)
nRBC: 0 % (ref 0.0–0.2)

## 2020-10-24 LAB — COMPREHENSIVE METABOLIC PANEL
ALT: 15 U/L (ref 0–44)
AST: 26 U/L (ref 15–41)
Albumin: 3.4 g/dL — ABNORMAL LOW (ref 3.5–5.0)
Alkaline Phosphatase: 61 U/L (ref 38–126)
Anion gap: 8 (ref 5–15)
BUN: 18 mg/dL (ref 8–23)
CO2: 24 mmol/L (ref 22–32)
Calcium: 9.3 mg/dL (ref 8.9–10.3)
Chloride: 108 mmol/L (ref 98–111)
Creatinine, Ser: 1.33 mg/dL — ABNORMAL HIGH (ref 0.61–1.24)
GFR, Estimated: 59 mL/min — ABNORMAL LOW (ref >60)
Glucose, Bld: 152 mg/dL — ABNORMAL HIGH (ref 70–99)
Potassium: 3.8 mmol/L (ref 3.5–5.1)
Sodium: 140 mmol/L (ref 135–145)
Total Bilirubin: 0.1 mg/dL — ABNORMAL LOW (ref 0.3–1.2)
Total Protein: 6.7 g/dL (ref 6.5–8.1)

## 2020-10-24 NOTE — ED Provider Notes (Signed)
Patient placed in Quick Look pathway, seen and evaluated   Chief Complaint: hemoglobin low ?  HPI:   Pt sent here for abnormal lab  ROS: no fever, feels well  Physical Exam:   Gen: No distress  Neuro: Awake and Alert  Skin: Warm    Focused Exam: Lungs clear heart rrr   Initiation of care has begun. The patient has been counseled on the process, plan, and necessity for staying for the completion/evaluation, and the remainder of the medical screening examination    Sidney Ace 10/24/20 1806    Blanchie Dessert, MD 10/24/20 2306

## 2020-10-24 NOTE — ED Triage Notes (Signed)
Pt was sent from [facility] bc of lab draw and hemoglobin was low.pt said no weakness no fatigue.

## 2020-10-25 ENCOUNTER — Encounter (HOSPITAL_COMMUNITY): Payer: Self-pay | Admitting: Emergency Medicine

## 2020-10-25 DIAGNOSIS — Z8673 Personal history of transient ischemic attack (TIA), and cerebral infarction without residual deficits: Secondary | ICD-10-CM | POA: Diagnosis not present

## 2020-10-25 DIAGNOSIS — F0150 Vascular dementia without behavioral disturbance: Secondary | ICD-10-CM | POA: Diagnosis present

## 2020-10-25 DIAGNOSIS — N179 Acute kidney failure, unspecified: Secondary | ICD-10-CM | POA: Diagnosis present

## 2020-10-25 DIAGNOSIS — F05 Delirium due to known physiological condition: Secondary | ICD-10-CM | POA: Diagnosis present

## 2020-10-25 DIAGNOSIS — I10 Essential (primary) hypertension: Secondary | ICD-10-CM | POA: Diagnosis present

## 2020-10-25 DIAGNOSIS — Z7902 Long term (current) use of antithrombotics/antiplatelets: Secondary | ICD-10-CM | POA: Diagnosis not present

## 2020-10-25 DIAGNOSIS — D62 Acute posthemorrhagic anemia: Principal | ICD-10-CM

## 2020-10-25 DIAGNOSIS — K922 Gastrointestinal hemorrhage, unspecified: Secondary | ICD-10-CM | POA: Diagnosis present

## 2020-10-25 DIAGNOSIS — K2961 Other gastritis with bleeding: Secondary | ICD-10-CM | POA: Diagnosis not present

## 2020-10-25 DIAGNOSIS — K2971 Gastritis, unspecified, with bleeding: Secondary | ICD-10-CM

## 2020-10-25 DIAGNOSIS — K5521 Angiodysplasia of colon with hemorrhage: Secondary | ICD-10-CM | POA: Diagnosis present

## 2020-10-25 DIAGNOSIS — Z79899 Other long term (current) drug therapy: Secondary | ICD-10-CM | POA: Diagnosis not present

## 2020-10-25 DIAGNOSIS — E7800 Pure hypercholesterolemia, unspecified: Secondary | ICD-10-CM | POA: Diagnosis present

## 2020-10-25 DIAGNOSIS — F0280 Dementia in other diseases classified elsewhere without behavioral disturbance: Secondary | ICD-10-CM | POA: Diagnosis present

## 2020-10-25 DIAGNOSIS — N400 Enlarged prostate without lower urinary tract symptoms: Secondary | ICD-10-CM | POA: Diagnosis present

## 2020-10-25 DIAGNOSIS — Z20822 Contact with and (suspected) exposure to covid-19: Secondary | ICD-10-CM | POA: Diagnosis present

## 2020-10-25 DIAGNOSIS — F17210 Nicotine dependence, cigarettes, uncomplicated: Secondary | ICD-10-CM | POA: Diagnosis present

## 2020-10-25 DIAGNOSIS — E785 Hyperlipidemia, unspecified: Secondary | ICD-10-CM | POA: Diagnosis present

## 2020-10-25 DIAGNOSIS — G309 Alzheimer's disease, unspecified: Secondary | ICD-10-CM | POA: Diagnosis present

## 2020-10-25 LAB — BASIC METABOLIC PANEL
Anion gap: 4 — ABNORMAL LOW (ref 5–15)
BUN: 13 mg/dL (ref 8–23)
CO2: 28 mmol/L (ref 22–32)
Calcium: 9.5 mg/dL (ref 8.9–10.3)
Chloride: 108 mmol/L (ref 98–111)
Creatinine, Ser: 1.18 mg/dL (ref 0.61–1.24)
GFR, Estimated: >60 mL/min (ref >60)
Glucose, Bld: 81 mg/dL (ref 70–99)
Potassium: 4.1 mmol/L (ref 3.5–5.1)
Sodium: 140 mmol/L (ref 135–145)

## 2020-10-25 LAB — CBC
HCT: 29.5 % — ABNORMAL LOW (ref 39.0–52.0)
Hemoglobin: 8.9 g/dL — ABNORMAL LOW (ref 13.0–17.0)
MCH: 22.4 pg — ABNORMAL LOW (ref 26.0–34.0)
MCHC: 30.2 g/dL (ref 30.0–36.0)
MCV: 74.1 fL — ABNORMAL LOW (ref 80.0–100.0)
Platelets: 265 10*3/uL (ref 150–400)
RBC: 3.98 MIL/uL — ABNORMAL LOW (ref 4.22–5.81)
RDW: 18.2 % — ABNORMAL HIGH (ref 11.5–15.5)
WBC: 6 10*3/uL (ref 4.0–10.5)
nRBC: 0 % (ref 0.0–0.2)

## 2020-10-25 LAB — RESP PANEL BY RT-PCR (FLU A&B, COVID) ARPGX2
Influenza A by PCR: NEGATIVE
Influenza B by PCR: NEGATIVE
SARS Coronavirus 2 by RT PCR: NEGATIVE

## 2020-10-25 LAB — POC OCCULT BLOOD, ED: Fecal Occult Bld: POSITIVE — AB

## 2020-10-25 LAB — PREPARE RBC (CROSSMATCH)

## 2020-10-25 LAB — HIV ANTIBODY (ROUTINE TESTING W REFLEX): HIV Screen 4th Generation wRfx: NONREACTIVE

## 2020-10-25 MED ORDER — PANTOPRAZOLE SODIUM 40 MG PO TBEC
40.0000 mg | DELAYED_RELEASE_TABLET | Freq: Every day | ORAL | Status: DC
  Administered 2020-10-25 – 2020-10-26 (×2): 40 mg via ORAL
  Filled 2020-10-25 (×2): qty 1

## 2020-10-25 MED ORDER — SODIUM CHLORIDE 0.9% IV SOLUTION: Freq: Once | INTRAVENOUS | Status: AC

## 2020-10-25 MED ORDER — FERROUS SULFATE 325 (65 FE) MG PO TABS: 325.0000 mg | ORAL_TABLET | ORAL | Status: DC

## 2020-10-25 MED ORDER — LISINOPRIL 10 MG PO TABS: 10.0000 mg | ORAL_TABLET | Freq: Every day | ORAL | Status: DC

## 2020-10-25 MED ORDER — ACETAMINOPHEN 325 MG PO TABS: 650.0000 mg | ORAL_TABLET | Freq: Four times a day (QID) | ORAL | Status: DC | PRN

## 2020-10-25 MED ORDER — ATORVASTATIN CALCIUM 40 MG PO TABS
40.0000 mg | ORAL_TABLET | Freq: Every morning | ORAL | Status: DC
  Administered 2020-10-25 – 2020-10-26 (×2): 40 mg via ORAL
  Filled 2020-10-25 (×2): qty 1

## 2020-10-25 MED ORDER — ACETAMINOPHEN 650 MG RE SUPP: 650.0000 mg | Freq: Four times a day (QID) | RECTAL | Status: DC | PRN

## 2020-10-25 MED ORDER — HYDRALAZINE HCL 20 MG/ML IJ SOLN: 10.0000 mg | Freq: Four times a day (QID) | INTRAMUSCULAR | Status: DC | PRN

## 2020-10-25 MED ORDER — HALOPERIDOL LACTATE 5 MG/ML IJ SOLN
INTRAMUSCULAR | Status: AC
  Administered 2020-10-25: 1 mg via INTRAVENOUS
  Filled 2020-10-25: qty 1

## 2020-10-25 MED ORDER — FINASTERIDE 5 MG PO TABS
5.0000 mg | ORAL_TABLET | Freq: Every day | ORAL | Status: DC
  Administered 2020-10-25 – 2020-10-26 (×2): 5 mg via ORAL
  Filled 2020-10-25 (×2): qty 1

## 2020-10-25 MED ORDER — AMLODIPINE BESYLATE 5 MG PO TABS
5.0000 mg | ORAL_TABLET | Freq: Every day | ORAL | Status: DC
  Administered 2020-10-26: 5 mg via ORAL
  Filled 2020-10-25: qty 1

## 2020-10-25 MED ORDER — QUETIAPINE FUMARATE 25 MG PO TABS
12.5000 mg | ORAL_TABLET | Freq: Every day | ORAL | Status: DC
  Administered 2020-10-25: 12.5 mg via ORAL
  Filled 2020-10-25 (×2): qty 1

## 2020-10-25 MED ORDER — HALOPERIDOL LACTATE 5 MG/ML IJ SOLN: 1.0000 mg | Freq: Four times a day (QID) | INTRAMUSCULAR | Status: DC | PRN

## 2020-10-25 MED ORDER — SODIUM CHLORIDE 0.9 % IV SOLN
510.0000 mg | Freq: Once | INTRAVENOUS | Status: AC
  Administered 2020-10-26: 510 mg via INTRAVENOUS
  Filled 2020-10-25: qty 17

## 2020-10-25 NOTE — ED Provider Notes (Signed)
[facility]ERGENCY DEPARTMENT Provider Note   CSN: [id number] Arrival date & time: 10/24/20  1657     History Chief Complaint  Patient presents with  . abnormal labs    Cameron Schwinn is a 65 y.o. male.  The history is provided by a caregiver. The history is limited by the condition of the patient (level 5 caveat dementia ).  Illness Location:  At nursing home  Quality:  Fatigue and sleeping a lot  Severity:  Moderate Onset quality:  Gradual Timing:  Constant Progression:  Unchanged Chronicity:  Recurrent Context:  Elderly with h/o GI bleed  Relieved by:  Nothing  Worsened by:  Time  Ineffective treatments:  None tried  Associated symptoms: no fever and no rash   Risk factors:  Elderly patient with dementia and h/o GI bleed       Past Medical History:  Diagnosis Date  . Acute encephalopathy 12/03/2017   [name] 12/04/2017  . High cholesterol   . Hypertension   . Stroke ([place])   . TIA (transient ischemic attack) 12/03/2017   [name] 12/04/2017    Patient Active Problem List   Diagnosis Date Noted  . Symptomatic anemia 02/06/2020  . Angiodysplasia of intestine with hemorrhage   . Acute on chronic blood loss anemia 07/30/2019  . Gastric and duodenal angiodysplasia with hemorrhage   . Heme positive stool   . Anemia   . Vascular dementia without behavioral disturbance ([place])   . Advanced care planning/counseling discussion   . Goals of care, counseling/discussion   . 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[place])   . Palliative care by specialist   . GI bleed 02/08/2019  . CVA (cerebral vascular accident) ([place]) 11/15/2018  . Benign essential HTN   . Tobacco abuse   . TIA (transient ischemic attack)   . Diastolic dysfunction   . Prediabetes   . Acute blood loss anemia   . Hyperlipidemia   . Syphilis   . Cerebral embolism with cerebral infarction 12/05/2017  . Smoker   . Acute encephalopathy 12/04/2017  . HTN (hypertension) 12/04/2017  . AKI (acute kidney  injury) ([place]) 12/04/2017  . Elevated PSA 12/04/2017  . Rhabdomyolysis 12/04/2017    Past Surgical History:  Procedure Laterality Date  . COLONOSCOPY WITH PROPOFOL N/A 02/11/2019   Procedure: COLONOSCOPY WITH PROPOFOL;  Surgeon: [name], MD;  Location: [place];  Service: Gastroenterology;  Laterality: N/A;  . ENTEROSCOPY N/A 08/04/2019   Procedure: ENTEROSCOPY;  Surgeon: [name], MD;  Location: [facility] ENDOSCOPY;  Service: Gastroenterology;  Laterality: N/A;  . ESOPHAGOGASTRODUODENOSCOPY N/A 02/09/2020   Procedure: ESOPHAGOGASTRODUODENOSCOPY (EGD);  Surgeon: Arta Silence, MD;  Location: [facility] ENDOSCOPY;  Service: Endoscopy;  Laterality: N/A;  . ESOPHAGOGASTRODUODENOSCOPY (EGD) WITH PROPOFOL N/A 02/11/2019   Procedure: ESOPHAGOGASTRODUODENOSCOPY (EGD) WITH PROPOFOL;  Surgeon: [name], MD;  Location: [facility] ENDOSCOPY;  Service: Gastroenterology;  Laterality: N/A;  . [name] CAPSULE STUDY Left 02/10/2020   Procedure: [name] CAPSULE STUDY;  Surgeon: Arta Silence, MD;  Location: [facility] ENDOSCOPY;  Service: Endoscopy;  Laterality: Left;  . HEMOSTASIS CLIP PLACEMENT  02/11/2019   Procedure: HEMOSTASIS CLIP PLACEMENT;  Surgeon: [name], MD;  Location: [place];  Service: Gastroenterology;;  . HEMOSTASIS CONTROL  02/11/2019   Procedure: HEMOSTASIS CONTROL;  Surgeon: [name], MD;  Location: [place];  Service: Gastroenterology;;  . HOT HEMOSTASIS N/A 02/11/2019   Procedure: HOT HEMOSTASIS (ARGON PLASMA COAGULATION/BICAP);  Surgeon: [name], MD;  Location: [facility] ENDOSCOPY;  Service: Gastroenterology;  Laterality: N/A;  . HOT HEMOSTASIS N/A 08/04/2019   Procedure: HOT HEMOSTASIS (ARGON PLASMA COAGULATION/BICAP);  Surgeon: [name], MD;  Location: [facility] ENDOSCOPY;  Service: Gastroenterology;  Laterality: N/A;  . LOOP RECORDER INSERTION N/A 12/07/2017   Procedure: LOOP RECORDER INSERTION;  Surgeon: Constance Haw, MD;  Location: [place] CV LAB;  Service:  Cardiovascular;  Laterality: N/A;  . NO PAST SURGERIES    . TEE WITHOUT CARDIOVERSION N/A 12/07/2017   Procedure: TRANSESOPHAGEAL ECHOCARDIOGRAM (TEE);  Surgeon: Dorothy Spark, MD;  Location: [facility] ENDOSCOPY;  Service: Cardiovascular;  Laterality: N/A;       History reviewed. No pertinent family history.  Social History   Tobacco Use  . Smoking status: Current Every Day Smoker    Packs/day: 0.50    Years: 24.00    Pack years: 12.00    Types: Cigarettes  . Smokeless tobacco: Never Used  Vaping Use  . Vaping Use: Never used  Substance Use Topics  . Alcohol use: No  . Drug use: No    Home Medications Prior to Admission medications   Medication Sig Start Date End Date Taking? Authorizing Provider  acetaminophen (TYLENOL) 500 MG tablet Take 500-1,000 mg by mouth every 8 (eight) hours as needed for mild pain.     [provider]  alum & mag hydroxide-simeth (MYLANTA) 200-200-20 MG/5ML suspension Take 30 mLs by mouth as needed for indigestion or heartburn.    [provider]  atorvastatin (LIPITOR) 10 MG tablet Take 4 tablets (40 mg total) by mouth daily. 08/08/19   [place], Carley D, DO  ferrous sulfate 325 (65 FE) MG tablet Take 325 mg by mouth 3 (three) times a week. Mon, Wed, Fri    [provider]  finasteride (PROSCAR) 5 MG tablet Take 5 mg by mouth daily.    [provider]  guaiFENesin (ROBITUSSIN) 100 MG/5ML SOLN Take 10 mLs by mouth every 4 (four) hours as needed for cough or to loosen phlegm.    [provider]  lisinopril (ZESTRIL) 10 MG tablet Take 1 tablet (10 mg total) by mouth daily. 02/15/19 02/06/20  Madalyn Rob, MD  loperamide (IMODIUM) 2 MG capsule Take 2 mg by mouth as needed for diarrhea or loose stools (not to exceed 8 doses in 24hrs).    [provider]  magnesium hydroxide (MILK OF MAGNESIA) 400 MG/5ML suspension Take 30 mLs by mouth at bedtime as needed for mild constipation.     [provider]   neomycin-bacitracin-polymyxin (NEOSPORIN) OINT Apply 1 application topically as needed for wound care.    [provider]  pantoprazole (PROTONIX) 40 MG tablet Take 40 mg by mouth daily.    [provider]  Vitamin D, Ergocalciferol, (DRISDOL) 1.25 MG (50000 UT) CAPS capsule Take 50,000 Units by mouth every 7 (seven) days. Wednesday    [provider]    Allergies    Patient has no known allergies.  Review of Systems   Review of Systems  Constitutional: Negative for fever.  Skin: Negative for rash.  All other systems reviewed and are negative.   Physical Exam Updated Vital Signs BP (!) 159/100   Pulse 69   Temp 98.2 F (36.8 C) (Oral)   Resp 19   SpO2 100%   Physical Exam Vitals and nursing note reviewed. Exam conducted with a chaperone present.  Constitutional:      Appearance: Normal appearance. He is not diaphoretic.  HENT:     Head: Normocephalic and atraumatic.  Nose: Nose normal.  Eyes:     Conjunctiva/sclera: Conjunctivae normal.     Pupils: Pupils are equal, round, and reactive to light.  Cardiovascular:     Rate and Rhythm: Normal rate and regular rhythm.     Pulses: Normal pulses.     Heart sounds: Normal heart sounds.  Pulmonary:     Effort: Pulmonary effort is normal.     Breath sounds: Normal breath sounds.  Abdominal:     General: Abdomen is flat. Bowel sounds are normal.     Palpations: Abdomen is soft.     Tenderness: There is no abdominal tenderness. There is no guarding.  Genitourinary:    Rectum: Guaiac result positive.  Musculoskeletal:     Cervical back: Normal range of motion and neck supple.  Skin:    General: Skin is warm and dry.     Capillary Refill: Capillary refill takes less than 2 seconds.     Coloration: Skin is pale.  Neurological:     General: No focal deficit present.     Mental Status: He is alert.     Deep Tendon Reflexes: Reflexes normal.  Psychiatric:        Mood and Affect: Mood normal.      ED Results / Procedures / Treatments   Labs (all labs ordered are listed, but only abnormal results are displayed) Results for orders placed or performed during the hospital encounter of 10/24/20  CBC with Differential  Result Value Ref Range   WBC 4.4 4.0 - 10.5 K/uL   RBC 3.48 (L) 4.22 - 5.81 MIL/uL   Hemoglobin 7.0 (L) 13.0 - 17.0 g/dL   HCT 26.0 (L) 39.0 - 52.0 %   MCV 74.7 (L) 80.0 - 100.0 fL   MCH 20.1 (L) 26.0 - 34.0 pg   MCHC 26.9 (L) 30.0 - 36.0 g/dL   RDW 19.9 (H) 11.5 - 15.5 %   Platelets 295 150 - 400 K/uL   nRBC 0.0 0.0 - 0.2 %   Neutrophils Relative % 54 %   Neutro Abs 2.4 1.7 - 7.7 K/uL   Lymphocytes Relative 23 %   Lymphs Abs 1.0 0.7 - 4.0 K/uL   Monocytes Relative 17 %   Monocytes Absolute 0.8 0.1 - 1.0 K/uL   Eosinophils Relative 5 %   Eosinophils Absolute 0.2 0.0 - 0.5 K/uL   Basophils Relative 1 %   Basophils Absolute 0.0 0.0 - 0.1 K/uL   Immature Granulocytes 0 %   Abs Immature Granulocytes 0.01 0.00 - 0.07 K/uL  Comprehensive metabolic panel  Result Value Ref Range   Sodium 140 135 - 145 mmol/L   Potassium 3.8 3.5 - 5.1 mmol/L   Chloride 108 98 - 111 mmol/L   CO2 24 22 - 32 mmol/L   Glucose, Bld 152 (H) 70 - 99 mg/dL   BUN 18 8 - 23 mg/dL   Creatinine, Ser 1.33 (H) 0.61 - 1.24 mg/dL   Calcium 9.3 8.9 - 10.3 mg/dL   Total Protein 6.7 6.5 - 8.1 g/dL   Albumin 3.4 (L) 3.5 - 5.0 g/dL   AST 26 15 - 41 U/L   ALT 15 0 - 44 U/L   Alkaline Phosphatase 61 38 - 126 U/L   Total Bilirubin 0.1 (L) 0.3 - 1.2 mg/dL   GFR, Estimated 59 (L) >60 mL/min   Anion gap 8 5 - 15  POC occult blood, ED Provider will collect  Result Value Ref Range   Fecal Occult Bld POSITIVE (  A) NEGATIVE   No results found.  EKG None  Radiology No results found.  Procedures Procedures   Medications Ordered in ED Medications - No data to display  ED Course  I have reviewed the triage vital signs and the nursing notes.  Pertinent labs & imaging results that were  available during my care of the patient were reviewed by me and considered in my medical decision making (see chart for details).    Message sent to Dr. Rosalie Gums of [facility] as patient is known to [place].    Isai Gottlieb was evaluated in Emergency Department on 10/25/2020 for the symptoms described in the history of present illness. He was evaluated in the context of the global COVID-19 pandemic, which necessitated consideration that the patient might be at risk for infection with the SARS-CoV-2 virus that causes COVID-19. Institutional protocols and algorithms that pertain to the evaluation of patients at risk for COVID-19 are in a state of rapid change based on information released by regulatory bodies including the CDC and federal and state organizations. These policies and algorithms were followed during the patient's care in the ED.  Final Clinical Impression(s) / ED Diagnoses Final diagnoses:  Gastrointestinal hemorrhage, unspecified gastrointestinal hemorrhage type  Symptomatic anemia    Admit to medicine    Alyzah Pelly, MD 10/25/20 2003

## 2020-10-25 NOTE — Progress Notes (Signed)
[facility]                                                                              Patient Demographics  Karl Miller, is a 65 y.o. male, DOB - 1956/01/27, FMB:[id number]  Admit date - 10/24/2020   Admitting Physician Shela Leff, MD  [facility]  Outpatient specialists:   LOS - 0  days   Medical records reviewed and are as summarized below:    Chief Complaint  Patient presents with  . abnormal labs       Brief summary   Patient is a 65 year old male with history of dementia, resident of memory care unit, angiodysplasia/small bowel AVMs, recurrent GI bleed, hypertension, hyperlipidemia, history of TIA and stroke was sent to the ED from his nursing home after blood work done over there revealed low hemoglobin.    In ED, hemoglobin 7.0, was above 9 on labs done in 01/2020.  At nursing home, hemoglobin was low 6.0. Patient was unable to provide any history due to dementia.  Patient was admitted for possible recurrent occult GI bleeding.  Assessment & Plan    Principal Problem: Acute on chronic blood loss anemia, recurrent GIB (gastrointestinal bleeding) -Prior history of GI bleed in 01/2020.  EGD revealed a single nonbleeding angiectasia in the stomach.  Capsule endoscopy revealed scattered few small nonbleeding AVMs throughout the small bowel without evidence of active bleeding. -Patient is on Plavix as well for prior history of TIA and CVA, will hold -Transfuse 2 units packed RBCs, follow H&H after transfusion, IV iron -Continue PPI, will follow GI recommendations  Active Problems:   HTN (hypertension) -BP currently elevated, will place on amlodipine 5 mg daily -Add hydralazine IV as needed with parameters  Mild acute kidney injury -Creatinine at baseline 1.1-1.2.  1.21 on 02/08/2020, likely due to anemia -Hold lisinopril    Hyperlipidemia -Continue statin  Prior history of TIA, CVA  (cerebral vascular accident) ([place]) -Continue statin, BP control, hold Plavix  BPH -Continue finasteride   History of dementia -Not on any medications, follow closely for any sundowning    Code Status: Full CODE STATUS DVT Prophylaxis:  SCDs Start: 10/25/20 0523   Level of Care: Level of care: Telemetry Medical Family Communication: Discussed all imaging results, lab results, explained to the patient's brother, [name], phone number [phone]   Disposition Plan:     Status is: Observation  The patient remains OBS appropriate and will d/c before 2 midnights.  Dispo: The patient is from: SNF              Anticipated d/c is to: SNF              Patient currently is not medically stable to d/c.   Difficult to place patient No      Time Spent in minutes   35 minutes  Procedures:  none  Consultants:   Gastroenterology  Antimicrobials:   Anti-infectives (From admission, onward)   None          Medications  Scheduled Meds: . atorvastatin  40 mg Oral q morning  .  finasteride  5 mg Oral Daily  . pantoprazole  40 mg Oral Daily   Continuous Infusions: . [START ON 10/26/2020] ferumoxytol     PRN Meds:.acetaminophen **OR** acetaminophen      Subjective:   Karl Miller was seen and examined today.  No acute complaints per patient.  No chest pain or shortness of breath.  Not aware of any bleeding.  No fevers.  Has dementia  Objective:   Vitals:   10/25/20 0830 10/25/20 0845 10/25/20 0930 10/25/20 1000  BP: (!) 158/100 (!) 156/94 (!) 161/86 (!) 160/102  Pulse: 73 69 (!) 58 64  Resp: 17 (!) 21 19 18   Temp:    98.4 F (36.9 C)  TempSrc:    Oral  SpO2: 100% 100% 100% 100%    Intake/Output Summary (Last 24 hours) at 10/25/2020 1024 Last data filed at 10/25/2020 0556 Gross per 24 hour  Intake 315 ml  Output --  Net 315 ml     Wt Readings from Last 3 Encounters:  02/06/20 72.6 kg  08/03/19 68.4 kg  02/11/19 77.1 kg      Exam  General: Alert and oriented x self, pleasant  Cardiovascular: S1 S2 auscultated,  RRR  Respiratory: Clear to auscultation bilaterally, no wheezing, rales or rhonchi  Gastrointestinal: Soft, nontender, nondistended, + bowel sounds  Ext: no pedal edema bilaterally  Neuro: is all 4 extremities spontaneously  Musculoskeletal: No digital cyanosis, clubbing  Skin: No rashes  Psych: pleasant and cooperative, has dementia   Data Reviewed:  I have personally reviewed following labs and imaging studies  Micro Results Recent Results (from the past 240 hour(s))  Resp Panel by RT-PCR (Flu A&B, Covid) Nasopharyngeal Swab     Status: None   Collection Time: 10/25/20  1:59 AM   Specimen: Nasopharyngeal Swab; Nasopharyngeal(NP) swabs in vial transport medium  Result Value Ref Range Status   SARS Coronavirus 2 by RT PCR NEGATIVE NEGATIVE Final    Comment: (NOTE) SARS-CoV-2 target nucleic acids are NOT DETECTED.  The SARS-CoV-2 RNA is generally detectable in upper respiratory specimens during the acute phase of infection. The lowest concentration of SARS-CoV-2 viral copies this assay can detect is 138 copies/mL. A negative result does not preclude SARS-Cov-2 infection and should not be used as the sole basis for treatment or other patient management decisions. A negative result may occur with  improper specimen collection/handling, submission of specimen other than nasopharyngeal swab, presence of viral mutation(s) within the areas targeted by this assay, and inadequate number of viral copies(<138 copies/mL). A negative result must be combined with clinical observations, patient history, and epidemiological information. The expected result is Negative.  Fact Sheet for Patients:  EntrepreneurPulse.com.au  Fact Sheet for Healthcare Providers:  IncredibleEmployment.be  This test is no t yet approved or cleared by the [place] FDA and   has been authorized for detection and/or diagnosis of SARS-CoV-2 by FDA under an Emergency Use Authorization (EUA). This EUA will remain  in effect (meaning this test can be used) for the duration of the COVID-19 declaration under Section 564(b)(1) of the Act, 21 U.S.C.section 360bbb-3(b)(1), unless the authorization is terminated  or revoked sooner.       Influenza A by PCR NEGATIVE NEGATIVE Final   Influenza B by PCR NEGATIVE NEGATIVE Final    Comment: (NOTE) The Xpert Xpress SARS-CoV-2/FLU/RSV plus assay is intended as an aid in the diagnosis of influenza from Nasopharyngeal swab specimens and should not be used as a sole basis for treatment. Nasal  washings and aspirates are unacceptable for Xpert Xpress SARS-CoV-2/FLU/RSV testing.  Fact Sheet for Patients: EntrepreneurPulse.com.au  Fact Sheet for Healthcare Providers: IncredibleEmployment.be  This test is not yet approved or cleared by the [place] FDA and has been authorized for detection and/or diagnosis of SARS-CoV-2 by FDA under an Emergency Use Authorization (EUA). This EUA will remain in effect (meaning this test can be used) for the duration of the COVID-19 declaration under Section 564(b)(1) of the Act, 21 U.S.C. section 360bbb-3(b)(1), unless the authorization is terminated or revoked.  Performed at [facility], [place] [address] [place], [place] [address]     Radiology Reports No results found.  Lab Data:  CBC: Recent Labs  Lab 10/24/20 1757  WBC 4.4  NEUTROABS 2.4  HGB 7.0*  HCT 26.0*  MCV 74.7*  PLT 616   Basic Metabolic Panel: Recent Labs  Lab 10/24/20 1757  NA 140  K 3.8  CL 108  CO2 24  GLUCOSE 152*  BUN 18  CREATININE 1.33*  CALCIUM 9.3   GFR: CrCl cannot be calculated (Unknown ideal weight.). Liver Function Tests: Recent Labs  Lab 10/24/20 1757  AST 26  ALT 15  ALKPHOS 61  BILITOT 0.1*  PROT 6.7  ALBUMIN 3.4*   No results  for input(s): LIPASE, AMYLASE in the last 168 hours. No results for input(s): AMMONIA in the last 168 hours. Coagulation Profile: No results for input(s): INR, PROTIME in the last 168 hours. Cardiac Enzymes: No results for input(s): CKTOTAL, CKMB, CKMBINDEX, TROPONINI in the last 168 hours. BNP (last 3 results) No results for input(s): PROBNP in the last 8760 hours. HbA1C: No results for input(s): HGBA1C in the last 72 hours. CBG: No results for input(s): GLUCAP in the last 168 hours. Lipid Profile: No results for input(s): CHOL, HDL, LDLCALC, TRIG, CHOLHDL, LDLDIRECT in the last 72 hours. Thyroid Function Tests: No results for input(s): TSH, T4TOTAL, FREET4, T3FREE, THYROIDAB in the last 72 hours. Anemia Panel: No results for input(s): VITAMINB12, FOLATE, FERRITIN, TIBC, IRON, RETICCTPCT in the last 72 hours. Urine analysis:    Component Value Date/Time   COLORURINE YELLOW 08/04/2019 1903   APPEARANCEUR TURBID (A) 08/04/2019 1903   LABSPEC 1.024 08/04/2019 1903   PHURINE 9.0 (H) 08/04/2019 1903   GLUCOSEU NEGATIVE 08/04/2019 1903   HGBUR NEGATIVE 08/04/2019 1903   BILIRUBINUR NEGATIVE 08/04/2019 1903   KETONESUR NEGATIVE 08/04/2019 1903   PROTEINUR >=300 (A) 08/04/2019 1903   NITRITE NEGATIVE 08/04/2019 1903   LEUKOCYTESUR NEGATIVE 08/04/2019 1903     Corde Antonini M.D. [facility] 10/25/2020, 10:24 AM  Available via Epic secure chat 7am-7pm After 7 pm, please refer to night coverage provider listed on amion.

## 2020-10-25 NOTE — ED Notes (Signed)
Report given to [facility]. Pt ready for transport to floor.

## 2020-10-25 NOTE — ED Notes (Signed)
Assuming care of patient at this time. Redressed patient's bloody IV site with a clean tegaderm. Pt repositioned in bed for comfort.

## 2020-10-25 NOTE — H&P (Signed)
History and Physical    Jerri Hargadon RJJ:[id number] DOB: [birth year] DOA: 10/24/2020  PCP: [facility] Patient coming from: Nursing home memory care unit  Chief Complaint: Abnormal lab  HPI: Harim Bi is a 65 y.o. male with medical history significant of dementia and resident of a memory care unit, angiodysplasia/small bowel AVMs, recurrent GI bleed, hypertension, hyperlipidemia, history of TIA and stroke sent to the ED from his nursing home after blood work done over there revealed low hemoglobin.  In the ED, hemodynamically stable.  Labs showing WBC 4.4, hemoglobin 7.0 (was above 9 on labs done in July 2021), platelet count 295K.  Sodium 140, potassium 3.8, chloride 108, bicarb 24, BUN 18, creatinine 1.3, glucose 152.  Had brown stool on rectal exam but FOBT positive.  Screening Covid and influenza PCR negative.  ED physician sent a message to Dr. Alessandra Bevels from [facility] requesting consultation in the morning.  Patient is not sure why he is here.  He has no complaints.  Denies dizziness, chest pain, shortness of breath, palpitations, or abdominal pain.  Brother at bedside states patient has had GI bleeds before.  Today's the nursing home informed him that the patient's hemoglobin was low (6.0).  Brother is concerned that the patient has been appearing generally weak.  Review of Systems:  All systems reviewed and apart from history of presenting illness, are negative.  Past Medical History:  Diagnosis Date  . Acute encephalopathy 12/03/2017   [name] 12/04/2017  . High cholesterol   . Hypertension   . Stroke ([place])   . TIA (transient ischemic attack) 12/03/2017   [name] 12/04/2017    Past Surgical History:  Procedure Laterality Date  . COLONOSCOPY WITH PROPOFOL N/A 02/11/2019   Procedure: COLONOSCOPY WITH PROPOFOL;  Surgeon: [name], MD;  Location: [place];  Service: Gastroenterology;  Laterality: N/A;  . ENTEROSCOPY N/A 08/04/2019   Procedure: ENTEROSCOPY;   Surgeon: [name], MD;  Location: [facility] ENDOSCOPY;  Service: Gastroenterology;  Laterality: N/A;  . ESOPHAGOGASTRODUODENOSCOPY N/A 02/09/2020   Procedure: ESOPHAGOGASTRODUODENOSCOPY (EGD);  Surgeon: Arta Silence, MD;  Location: [facility]COPY;  Service: Endoscopy;  Laterality: N/A;  . ESOPHAGOGASTRODUODENOSCOPY (EGD) WITH PROPOFOL N/A 02/11/2019   Procedure: ESOPHAGOGASTRODUODENOSCOPY (EGD) WITH PROPOFOL;  Surgeon: [name], MD;  Location: [facility] ENDOSCOPY;  Service: Gastroenterology;  Laterality: N/A;  . [name] CAPSULE STUDY Left 02/10/2020   Procedure: [name] CAPSULE STUDY;  Surgeon: Arta Silence, MD;  Location: [facility] ENDOSCOPY;  Service: Endoscopy;  Laterality: Left;  . HEMOSTASIS CLIP PLACEMENT  02/11/2019   Procedure: HEMOSTASIS CLIP PLACEMENT;  Surgeon: [name], MD;  Location: [place];  Service: Gastroenterology;;  . HEMOSTASIS CONTROL  02/11/2019   Procedure: HEMOSTASIS CONTROL;  Surgeon: [name], MD;  Location: [place];  Service: Gastroenterology;;  . HOT HEMOSTASIS N/A 02/11/2019   Procedure: HOT HEMOSTASIS (ARGON PLASMA COAGULATION/BICAP);  Surgeon: [name], MD;  Location: [facility] ENDOSCOPY;  Service: Gastroenterology;  Laterality: N/A;  . HOT HEMOSTASIS N/A 08/04/2019   Procedure: HOT HEMOSTASIS (ARGON PLASMA COAGULATION/BICAP);  Surgeon: [name], MD;  Location: [facility] ENDOSCOPY;  Service: Gastroenterology;  Laterality: N/A;  . LOOP RECORDER INSERTION N/A 12/07/2017   Procedure: LOOP RECORDER INSERTION;  Surgeon: Constance Haw, MD;  Location: [place] CV LAB;  Service: Cardiovascular;  Laterality: N/A;  . NO PAST SURGERIES    . TEE WITHOUT CARDIOVERSION N/A 12/07/2017   Procedure: TRANSESOPHAGEAL ECHOCARDIOGRAM (TEE);  Surgeon: Dorothy Spark, MD;  Location: [place];  Service: Cardiovascular;  Laterality: N/A;     reports that he has been smoking cigarettes. He has a 12.00 pack-year smoking history. He has never used smokeless tobacco. He  reports that he does not drink alcohol and does not use drugs.  No Known Allergies  History reviewed. No pertinent family history.  Prior to Admission medications   Medication Sig Start Date End Date Taking? Authorizing Provider  acetaminophen (TYLENOL) 500 MG tablet Take 500-1,000 mg by mouth every 8 (eight) hours as needed for mild pain.    Yes [provider]  atorvastatin (LIPITOR) 40 MG tablet Take 40 mg by mouth every morning. 01/11/20  Yes [provider]  clopidogrel (PLAVIX) 75 MG tablet Take 75 mg by mouth every morning. 10/09/20  Yes [provider]  ferrous sulfate 325 (65 FE) MG tablet Take 325 mg by mouth 3 (three) times a week. Mon, Wed, Fri   Yes [provider]  finasteride (PROSCAR) 5 MG tablet Take 5 mg by mouth daily.   Yes [provider]  guaiFENesin (ROBITUSSIN) 100 MG/5ML SOLN Take 10 mLs by mouth every 4 (four) hours as needed for cough or to loosen phlegm.   Yes [provider]  lisinopril (ZESTRIL) 10 MG tablet Take 1 tablet (10 mg total) by mouth daily. 02/15/19 02/06/20 Yes Madalyn Rob, MD  loperamide (IMODIUM) 2 MG capsule Take 2 mg by mouth as needed for diarrhea or loose stools (not to exceed 8 doses in 24hrs).   Yes [provider]  magnesium hydroxide (MILK OF MAGNESIA) 400 MG/5ML suspension Take 30 mLs by mouth at bedtime as needed for mild constipation.    Yes [provider]  neomycin-bacitracin-polymyxin (NEOSPORIN) OINT Apply 1 application topically as needed for wound care.   Yes [provider]  pantoprazole (PROTONIX) 40 MG tablet Take 40 mg by mouth daily.   Yes [provider]    Physical Exam: Vitals:   10/25/20 0300 10/25/20 0315 10/25/20 0415 10/25/20 0500  BP: (!) 144/83 138/80 (!) 143/85 (!) 153/84  Pulse: 73 69 70 76  Resp: 20 15 20 20   Temp:      TempSrc:      SpO2: 100% 100% 99% 99%    Physical Exam Constitutional:      General: He is not in  acute distress. HENT:     Head: Normocephalic and atraumatic.  Eyes:     Extraocular Movements: Extraocular movements intact.     Conjunctiva/sclera: Conjunctivae normal.  Cardiovascular:     Rate and Rhythm: Normal rate and regular rhythm.     Pulses: Normal pulses.  Pulmonary:     Effort: Pulmonary effort is normal. No respiratory distress.     Breath sounds: Normal breath sounds. No wheezing or rales.  Abdominal:     General: Bowel sounds are normal. There is no distension.     Palpations: Abdomen is soft.     Tenderness: There is no abdominal tenderness. There is no guarding or rebound.  Musculoskeletal:        General: No swelling or tenderness.     Cervical back: Normal range of motion and neck supple.  Skin:    Coloration: Skin is pale.  Neurological:     Mental Status: He is alert.     Comments: Awake and alert Oriented to self only     Labs on Admission: I have personally reviewed following labs and imaging studies  CBC: Recent Labs  Lab 10/24/20 1757  WBC 4.4  NEUTROABS 2.4  HGB 7.0*  HCT 26.0*  MCV 74.7*  PLT 240   Basic Metabolic Panel: Recent Labs  Lab 10/24/20 1757  NA 140  K 3.8  CL 108  CO2 24  GLUCOSE 152*  BUN 18  CREATININE 1.33*  CALCIUM 9.3   GFR: CrCl cannot be calculated (Unknown ideal weight.). Liver Function Tests: Recent Labs  Lab 10/24/20 1757  AST 26  ALT 15  ALKPHOS 61  BILITOT 0.1*  PROT 6.7  ALBUMIN 3.4*   No results for input(s): LIPASE, AMYLASE in the last 168 hours. No results for input(s): AMMONIA in the last 168 hours. Coagulation Profile: No results for input(s): INR, PROTIME in the last 168 hours. Cardiac Enzymes: No results for input(s): CKTOTAL, CKMB, CKMBINDEX, TROPONINI in the last 168 hours. BNP (last 3 results) No results for input(s): PROBNP in the last 8760 hours. HbA1C: No results for input(s): HGBA1C in the last 72 hours. CBG: No results for input(s): GLUCAP in the last 168 hours. Lipid  Profile: No results for input(s): CHOL, HDL, LDLCALC, TRIG, CHOLHDL, LDLDIRECT in the last 72 hours. Thyroid Function Tests: No results for input(s): TSH, T4TOTAL, FREET4, T3FREE, THYROIDAB in the last 72 hours. Anemia Panel: No results for input(s): VITAMINB12, FOLATE, FERRITIN, TIBC, IRON, RETICCTPCT in the last 72 hours. Urine analysis:    Component Value Date/Time   COLORURINE YELLOW 08/04/2019 1903   APPEARANCEUR TURBID (A) 08/04/2019 1903   LABSPEC 1.024 08/04/2019 1903   PHURINE 9.0 (H) 08/04/2019 1903   GLUCOSEU NEGATIVE 08/04/2019 1903   HGBUR NEGATIVE 08/04/2019 1903   BILIRUBINUR NEGATIVE 08/04/2019 1903   KETONESUR NEGATIVE 08/04/2019 1903   PROTEINUR >=300 (A) 08/04/2019 1903   NITRITE NEGATIVE 08/04/2019 1903   LEUKOCYTESUR NEGATIVE 08/04/2019 1903    Radiological Exams on Admission: No results found.  Assessment/Plan Principal Problem:   GIB (gastrointestinal bleeding) Active Problems:   HTN (hypertension)   Hyperlipidemia   CVA (cerebral vascular accident) ([place])   Acute on chronic blood loss anemia   Recurrent GI bleed Acute on chronic blood loss anemia He was admitted in July 2021 for GI bleed.  EGD done at that time revealed a single nonbleeding angiectasia in the stomach.  Capsule endoscopy was also done which revealed scattered few small nonbleeding AVMs throughout the small bowel without evidence of active bleeding.  Brought to the ED today for evaluation of worsening anemia and generalized weakness.  He is on Plavix.  His hemoglobin is 7.0, was above 9 on labs done in July last year.  Had brown stool on rectal exam done in the ED but FOBT positive.  Abdominal exam benign.  Currently hemodynamically stable. -ED physician sent a message to Dr. Alessandra Bevels from [facility] requesting consultation in the morning.  Type and screen.  Patient is not able to give consent for blood transfusion given his advanced dementia, received consent from his brother at bedside and  2 units PRBCs ordered.  Follow-up posttransfusion H&H.  Continue home PPI and iron supplement.  Hold Plavix.  Hypertension Blood pressure stable. -Hold antihypertensives at this time given concern for GI bleed.  Hyperlipidemia -Continue Lipitor  BPH -Continue finasteride  History of TIA/stroke -Hold Plavix given concern for recurrent GI bleed  DVT prophylaxis: SCDs Code Status: Full code-discussed with the patient's brother. Family Communication: Brother at bedside. Disposition Plan: Status is: Observation  The patient remains OBS appropriate and will d/c before 2 midnights.  Dispo: The patient is from: Memory care unit of nursing home  Anticipated d/c is to: Memory care unit of nursing home              Patient currently is not medically stable to d/c.   Difficult to place patient No  Level of care: Level of care: Telemetry Medical   The medical decision making on this patient was of high complexity and the patient is at high risk for clinical deterioration, therefore this is a level 3 visit.  Shela Leff MD [facility]  If 7PM-7AM, please contact night-coverage [email or web address]  10/25/2020, 5:27 AM

## 2020-10-25 NOTE — ED Notes (Signed)
Pt brother said that we could call his number (in chart) at any time for anything. Pt brother would like to be notified with updates.

## 2020-10-25 NOTE — Plan of Care (Signed)

## 2020-10-25 NOTE — Consult Note (Addendum)
[facility] Consult: 8:52 AM 10/25/2020  LOS: 0 days    Referring Provider: Dr Tana Coast  Primary Care Physician:  Administration, [facility] Primary Gastroenterologist:  [name] (seen by both [facility], [facility] in past 2-3 years) POA: Brother [name] [phone]    Reason for Consultation:  GIB.     HPI: Karl Miller is a 65 y.o. male.  PMH Vascular and Alzheimer's dementia.  Recurrent CVAs.  Chronic Plavix.  Spinal mass, followed by Dr. Arnoldo Morale.  Hypertension. S/p loop recorder insertion.   Hyperlipidemia.  Chronic anemia requiring periodic blood transfusion, Feraheme.  SNF resident/total care at [facility].    3 GI bleeds with melena, FOBT positive, blood loss and IDA anemia. 02/11/2019 colonoscopy.  Poor prep, limited visualization and no visualization of the ascending colon or cecum.  2 small polyps in descending and transverse colon, not resected.  Diverticulosis at sigmoid, descending, transverse colon. 02/11/2019 EGD.  Dr. Billie Lade for gastric AVMs, one actively bleeding.  Bleeding AVM treated with epi, APC, Endo Clip.  Additional nonbleeding AVMs in the stomach and one in the duodenum were treated with epinephrine, APC 08/04/2019 SBE.  Dr. Billie Lade.  Endo Clip, placed in 2020, found in the gastric body.  Clip intact and no associated bleeding, it was left in place.  6, nonbleeding AVMs found in duodenal bulb and D2-D4.  These were fulgurated/ablated with APC.  2, nonbleeding, proximal jejunal AVMs also ablated with APC 02/09/2020 EGD,.  Dr. Arta Silence.  Solitary, diminutive, nonbleeding gastric AVM.  Hemoclip in body of stomach, presumably from prior endoscopic intervention.  Normal duodenum to D2.  No source for anemia on today's endoscopy.  No intervention performed. 02/11/2020 VCE: Scattered, small,  nonbleeding AVMs throughout the small bowel.  And from his SNF due to recurrent anemia.  Patient denied weakness, fatigue, shortness of breath, chest pain.  At baseline he is ambulatory.  No problems eating or moving his bowels.  No reports of bloody or melenic stool. Hgb 7, was 9.7 in 01/2020.  MCV 74.  Normal platelets, normal INR. Slight renal insufficiency, BUN/creatinine 18/1.3, GFR 59.  Electrolytes, LFTs unremarkable.  Glucose 152. FOBT positive, but characteristics of the stool on DRE by ED staff not reported  Administered meds include Protonix 40 mg/day, Plavix daily, ferrous sulfate 325 mg 3 x weekly    Past Medical History:  Diagnosis Date  . Acute encephalopathy 12/03/2017   [name] 12/04/2017  . High cholesterol   . Hypertension   . Stroke ([place])   . TIA (transient ischemic attack) 12/03/2017   [name] 12/04/2017    Past Surgical History:  Procedure Laterality Date  . COLONOSCOPY WITH PROPOFOL N/A 02/11/2019   Procedure: COLONOSCOPY WITH PROPOFOL;  Surgeon: [name], MD;  Location: [place];  Service: Gastroenterology;  Laterality: N/A;  . ENTEROSCOPY N/A 08/04/2019   Procedure: ENTEROSCOPY;  Surgeon: [name], MD;  Location: [facility] ENDOSCOPY;  Service: Gastroenterology;  Laterality: N/A;  . ESOPHAGOGASTRODUODENOSCOPY N/A 02/09/2020   Procedure: ESOPHAGOGASTRODUODENOSCOPY (EGD);  Surgeon: Arta Silence, MD;  Location: [facility] ENDOSCOPY;  Service: Endoscopy;  Laterality: N/A;  . ESOPHAGOGASTRODUODENOSCOPY (EGD) WITH PROPOFOL N/A 02/11/2019   Procedure: ESOPHAGOGASTRODUODENOSCOPY (EGD) WITH PROPOFOL;  Surgeon: [name], MD;  Location[facility] END[facility]COPY;  Service: Gastroenterology;  Laterality: N/A;  . [name] CAPSULE STUDY Left 02/10/2020   Procedure: [name] CAPSULE STUDY;  Surgeon: Arta Silence, MD;  Location: [facility] ENDOSCOPY;  Service: Endoscopy;  Laterality: Left;  . HEMOSTASIS CLIP PLACEMENT  02/11/2019   Procedure: HEMOSTASIS CLIP PLACEMENT;  Surgeon: [name], MD;   Location: [place];  Service: Gastroenterology;;  . HEMOSTASIS CONTROL  02/11/2019   Procedure: HEMOSTASIS CONTROL;  Surgeon: [name], MD;  Location: [place];  Service: Gastroenterology;;  . HOT HEMOSTASIS N/A 02/11/2019   Procedure: HOT HEMOSTASIS (ARGON PLASMA COAGULATION/BICAP);  Surgeon: [name], MD;  Location: [facility] ENDOSCOPY;  Service: Gastroenterology;  Laterality: N/A;  . HOT HEMOSTASIS N/A 08/04/2019   Procedure: HOT HEMOSTASIS (ARGON PLASMA COAGULATION/BICAP);  Surgeon: [name], MD;  Location: [facility] ENDOSCOPY;  Service: Gastroenterology;  Laterality: N/A;  . LOOP RECORDER INSERTION N/A 12/07/2017   Procedure: LOOP RECORDER INSERTION;  Surgeon: Constance Haw, MD;  Location: [place] [facility] LAB;  Service: Cardiovascular;  Laterality: N/A;  . NO PAST SURGERIES    . TEE WITHOUT CARDIOVERSION N/A 12/07/2017   Procedure: TRANSESOPHAGEAL ECHOCARDIOGRAM (TEE);  Surgeon: Dorothy Spark, MD;  Location: [facility] ENDOSCOPY;  Service: Cardiovascular;  Laterality: N/A;    Prior to Admission medications   Medication Sig Start Date End Date Taking? Authorizing Provider  acetaminophen (TYLENOL) 500 MG tablet Take 500-1,000 mg by mouth every 8 (eight) hours as needed for mild pain.    Yes [provider]  atorvastatin (LIPITOR) 40 MG tablet Take 40 mg by mouth every morning. 01/11/20  Yes [provider]  clopidogrel (PLAVIX) 75 MG tablet Take 75 mg by mouth every morning. 10/09/20  Yes [provider]  ferrous sulfate 325 (65 FE) MG tablet Take 325 mg by mouth 3 (three) times a week. Mon, Wed, Fri   Yes [provider]  finasteride (PROSCAR) 5 MG tablet Take 5 mg by mouth daily.   Yes [provider]  guaiFENesin (ROBITUSSIN) 100 MG/5ML SOLN Take 10 mLs by mouth every 4 (four) hours as needed for cough or to loosen phlegm.   Yes [provider]  lisinopril (ZESTRIL) 10 MG tablet Take 1 tablet (10 mg total) by mouth daily. 02/15/19  02/06/20 Yes Madalyn Rob, MD  loperamide (IMODIUM) 2 MG capsule Take 2 mg by mouth as needed for diarrhea or loose stools (not to exceed 8 doses in 24hrs).   Yes [provider]  magnesium hydroxide (MILK OF MAGNESIA) 400 MG/5ML suspension Take 30 mLs by mouth at bedtime as needed for mild constipation.    Yes [provider]  neomycin-bacitracin-polymyxin (NEOSPORIN) OINT Apply 1 application topically as needed for wound care.   Yes [provider]  pantoprazole (PROTONIX) 40 MG tablet Take 40 mg by mouth daily.   Yes [provider]    Scheduled Meds: . atorvastatin  40 mg Oral q morning  . [START ON 10/26/2020] ferrous sulfate  325 mg Oral Once per day on Mon Wed Fri  . finasteride  5 mg Oral Daily  . pantoprazole  40 mg Oral Daily   Infusions:  PRN Meds: acetaminophen **OR** acetaminophen   Allergies as of 10/24/2020  . (No Known Allergies)    History reviewed. No pertinent family history.  Social History   Socioeconomic History  .  Marital status: Single    Spouse name: Not on file  . Number of children: Not on file  . Years of education: Not on file  . Highest education level: Not on file  Occupational History  . Not on file  Tobacco Use  . Smoking status: Current Every Day Smoker    Packs/day: 0.50    Years: 24.00    Pack years: 12.00    Types: Cigarettes  . Smokeless tobacco: Never Used  Vaping Use  . Vaping Use: Never used  Substance and Sexual Activity  . Alcohol use: No  . Drug use: No  . Sexual activity: Not Currently  Other Topics Concern  . Not on file  Social History Narrative  . Not on file   Social Determinants of Health   Financial Resource Strain: Not on file  Food Insecurity: Not on file  Transportation Needs: Not on file  Physical Activity: Not on file  Stress: Not on file  Social Connections: Not on file  Intimate Partner Violence: Not on file    REVIEW OF SYSTEMS: Constitutional: No fatigue, no  weakness ENT:  No nose bleeds Pulm: No shortness of breath.  No cough. CV:  No palpitations, no LE edema.  No chest pain. GU:  No hematuria, no frequency GI: See HPI Heme: No reports of unusual or excessive bleeding or bruising Transfusions: Several transfusions the RBCs over the past few years Neuro:  No headaches, no peripheral tingling or numbness Derm:  No itching, no rash or sores.  Endocrine:  No sweats or chills.  No polyuria or dysuria Immunization: Has been vaccinated for Covid with Moderna vaccine. Travel:  None beyond local counties in last few months.    PHYSICAL EXAM: Vital signs in last 24 hours: Vitals:   10/25/20 0630 10/25/20 0805  BP: (!) 147/93 (!) 157/95  Pulse: 70 72  Resp: 13 19  Temp:  98.3 F (36.8 C)  SpO2: 100% 100%   Wt Readings from Last 3 Encounters:  02/06/20 72.6 kg  08/03/19 68.4 kg  02/11/19 77.1 kg    General: Pleasant, calm, resting comfortably on stretcher.  Looks pale but in no distress. Head: Facial asymmetry or swelling.  No signs of head trauma. Eyes: No scleral icterus, no conjunctival pallor. Ears: Not hard of hearing Nose: No congestion or discharge Mouth: Moist, pink, clear mucosa.  Tongue midline.  Dental condition poor Neck: No JVD, no masses, no thyromegaly Lungs: No labored breathing or cough.  Lungs clear bilaterally with good breath sounds Heart: RRR.  No MRG.  S1, S2 present Abdomen: Soft.  No masses, HSM, bruits, hernias.  Active bowel sounds.  No tenderness..   Rectal: No masses palpated.  Scant amount of the stool is medium brown-colored and very trace heme positive. Musc/Skeltl: No joint redness, swelling or gross deformity. Extremities: No CCE Neurologic: Alert.  Follows simple commands.  Can tell me that he was born in [place].  Cannot tell me the year or name his current location.  Moves all 4 limbs without tremor or gross weakness. Skin: Pale. Tattoos: None observed Nodes: No cervical adenopathy Psych:  Calm, pleasant, cooperative, paucity of speech.  Intake/Output from previous day: 04/06 0701 - 04/07 0700 In: 315 [Blood:315] Out: -  Intake/Output this shift: No intake/output data recorded.  LAB RESULTS: Recent Labs    10/24/20 1757  WBC 4.4  HGB 7.0*  HCT 26.0*  PLT 295   BMET Lab Results  Component Value Date   NA 140  10/24/2020   NA 138 02/08/2020   NA 144 02/07/2020   K 3.8 10/24/2020   K 3.8 02/08/2020   K 4.2 02/07/2020   CL 108 10/24/2020   CL 107 02/08/2020   CL 112 (H) 02/07/2020   CO2 24 10/24/2020   CO2 22 02/08/2020   CO2 26 02/07/2020   GLUCOSE 152 (H) 10/24/2020   GLUCOSE 115 (H) 02/08/2020   GLUCOSE 86 02/07/2020   BUN 18 10/24/2020   BUN 18 02/08/2020   BUN 23 02/07/2020   CREATININE 1.33 (H) 10/24/2020   CREATININE 1.21 02/08/2020   CREATININE 1.16 02/07/2020   CALCIUM 9.3 10/24/2020   CALCIUM 9.3 02/08/2020   CALCIUM 9.5 02/07/2020   LFT Recent Labs    10/24/20 1757  PROT 6.7  ALBUMIN 3.4*  AST 26  ALT 15  ALKPHOS 61  BILITOT 0.1*   PT/INR Lab Results  Component Value Date   INR 1.0 02/06/2020   INR 1.1 02/08/2019   INR 1.1 11/15/2018   Hepatitis Panel No results for input(s): HEPBSAG, HCVAB, HEPAIGM, HEPBIGM in the last 72 hours. C-Diff No components found for: CDIFF Lipase  No results found for: LIPASE  Drugs of Abuse     Component Value Date/Time   LABOPIA NONE DETECTED 02/08/2019 1309   COCAINSCRNUR NONE DETECTED 02/08/2019 1309   LABBENZ NONE DETECTED 02/08/2019 1309   AMPHETMU NONE DETECTED 02/08/2019 1309   THCU NONE DETECTED 02/08/2019 1309   LABBARB NONE DETECTED 02/08/2019 1309     RADIOLOGY STUDIES: No results found.    IMPRESSION:   *   Recurrent blood loss anemia and likely IDA given low MCV On po Iron 3 x weekly.  Has required blood transfusions in the past as well as Feraheme. Treated for bleeding and nonbleeding AVMs on previous endoscopies, SBE, VCE Suboptimal colonoscopy due to poor prep  01/2019.  No overt bleeding, diminutive polyps left in place. Receiving the first of 2 ordered PRBCs.  *   Hx CVA, TIA.  Dementia.    *    Chronic Plavix.  lastest dose: 4/6 0800   PLAN:     *    Per Dr. Fuller Plan. For now, ordered heart healthy diet as it will take a few days before we can pursue any planned endoscopic work-up.  *   As has been questioned during previous admissions with blood loss anemia, need to revisit risk-benefit of Plavix and consider stopping this altogether, perhaps substituting with aspirin  *    Continue the Protonix 40 mg p.o. daily. Holding the oral iron, this tends to confuse the matter of whether he has dark or melenic stools.  Ordered Feraheme infusion to be given tomorrow morning and consider repeating this in a week.  Azucena Freed  10/25/2020, 8:52 AM Phone [phone]  ________________________________________________________________________  Velora Heckler GI MD note:  I personally examined the patient, reviewed the data and agree with the assessment and plan described above.  65 yo man with dementia (unaware of year, place, time, self), PVD, on chronic blood thinner plavix because of recurrent CVAs.  He has had exhaustive GI testing in the past 2 years including 2 EGDs, 1 colonoscopy, 1 small bowel endoscopy and 1 pill camera of the small bowel. He has known scattered AVMs throughout his small bowel and those almost certainly ooze blood intermittently, especially because he needs to be on plavix. His Hb has drifted without overt GI bleeding.   He is on iron orally three times per week.  I think he will probably do better with more iron.  I recommend iron infusion while in hospital (in addition to the 2 units blood already ordered).  I think he should receive another iron infusion in 1-2 weeks as an outpatient and then his blood counts should be monitored every 3-4 weeks to determine if/when he needs further iron infusions. This can be done through his PCP, his care  facility or the V.A.  If none are willing then hematology at the cancer center is very good with this type of service.  No plans for invasive GI testing now and I recommend against them in the future as well unless he has overt, significant GI bleeding.  Can restart plavix in 3-4 days. Should certainly consider not resuming the plavix, ever.   Signing off for now, please call or page with any further questions or concerns.   Owens Loffler, MD [facility] Gastroenterology Pager [phone]

## 2020-10-25 NOTE — ED Notes (Signed)
Pt found sitting on the edge of the bed despite siderails up. Pt self removed monitoring equipment and was found trying to self dc IV cathter. Attempted to redirect patient, but patient is not redirectable. Yellow non slip socks in place. Bed remains in low position. Call bell within reach.

## 2020-10-26 DIAGNOSIS — K2961 Other gastritis with bleeding: Secondary | ICD-10-CM

## 2020-10-26 LAB — BPAM RBC
Blood Product Expiration Date: 202204172359
Blood Product Expiration Date: 202204292359
ISSUE DATE / TIME: 202204070546
ISSUE DATE / TIME: 202204071001
Unit Type and Rh: 9500
Unit Type and Rh: 9500

## 2020-10-26 LAB — TYPE AND SCREEN
ABO/RH(D): O NEG
Antibody Screen: POSITIVE
Donor AG Type: NEGATIVE
Donor AG Type: NEGATIVE
Unit division: 0
Unit division: 0

## 2020-10-26 LAB — CBC
HCT: 30.5 % — ABNORMAL LOW (ref 39.0–52.0)
Hemoglobin: 9.2 g/dL — ABNORMAL LOW (ref 13.0–17.0)
MCH: 22.3 pg — ABNORMAL LOW (ref 26.0–34.0)
MCHC: 30.2 g/dL (ref 30.0–36.0)
MCV: 74 fL — ABNORMAL LOW (ref 80.0–100.0)
Platelets: 271 10*3/uL (ref 150–400)
RBC: 4.12 MIL/uL — ABNORMAL LOW (ref 4.22–5.81)
RDW: 18.6 % — ABNORMAL HIGH (ref 11.5–15.5)
WBC: 6.8 10*3/uL (ref 4.0–10.5)
nRBC: 0 % (ref 0.0–0.2)

## 2020-10-26 LAB — BASIC METABOLIC PANEL
Anion gap: 9 (ref 5–15)
BUN: 10 mg/dL (ref 8–23)
CO2: 26 mmol/L (ref 22–32)
Calcium: 9.7 mg/dL (ref 8.9–10.3)
Chloride: 105 mmol/L (ref 98–111)
Creatinine, Ser: 1.24 mg/dL (ref 0.61–1.24)
GFR, Estimated: >60 mL/min (ref >60)
Glucose, Bld: 84 mg/dL (ref 70–99)
Potassium: 3.7 mmol/L (ref 3.5–5.1)
Sodium: 140 mmol/L (ref 135–145)

## 2020-10-26 MED ORDER — CLOPIDOGREL BISULFATE 75 MG PO TABS: 75.0000 mg | ORAL_TABLET | Freq: Every morning | ORAL | Status: DC

## 2020-10-26 MED ORDER — AMLODIPINE BESYLATE 5 MG PO TABS: 5.0000 mg | ORAL_TABLET | Freq: Every day | ORAL | 1 refills | Status: AC

## 2020-10-26 MED ORDER — QUETIAPINE FUMARATE 25 MG PO TABS: 12.5000 mg | ORAL_TABLET | Freq: Every day | ORAL | 0 refills | Status: DC

## 2020-10-26 MED ORDER — AMLODIPINE BESYLATE 5 MG PO TABS: 5.0000 mg | ORAL_TABLET | Freq: Every day | ORAL | Status: DC

## 2020-10-26 MED ORDER — FERROUS SULFATE 325 (65 FE) MG PO TABS: 325.0000 mg | ORAL_TABLET | Freq: Two times a day (BID) | ORAL | 3 refills | Status: AC

## 2020-10-26 NOTE — NC FL2 (Addendum)
[place] LEVEL OF CARE SCREENING TOOL     IDENTIFICATION  Patient Name: Karl Miller Birthdate: [birth year] Sex: male Admission Date (Current Location): 10/24/2020  [facility] [place] Number:  Herbalist and Address:  [facility] [place] [address], [place], [place] [address]      Provider Number: [id number]  Attending Physician Name and Address:  Mendel Corning, MD  Relative Name and Phone Number:       Current Level of Care: Hospital Recommended Level of Care: Assisted Living [facility] Prior Approval Number:    Date Approved/Denied:   PASRR Number:    Discharge Plan: Other (Comment) (ALF- Memory Care)    Current Diagnoses: Patient Active Problem List   Diagnosis Date Noted  . GIB (gastrointestinal bleeding) 10/25/2020  . Symptomatic anemia 02/06/2020  . Angiodysplasia of intestine with hemorrhage   . Acute on chronic blood loss anemia 07/30/2019  . Gastric and duodenal angiodysplasia with hemorrhage   . Heme positive stool   . Anemia   . Vascular dementia without behavioral disturbance ([place])   . Advanced care planning/counseling discussion   . Goals of care, counseling/discussion   . 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[place])   . Palliative care by specialist   . GI bleed 02/08/2019  . CVA (cerebral vascular accident) ([place]) 11/15/2018  . Benign essential HTN   . Tobacco abuse   . TIA (transient ischemic attack)   . Diastolic dysfunction   . Prediabetes   . Acute blood loss anemia   . Hyperlipidemia   . Syphilis   . Cerebral embolism with cerebral infarction 12/05/2017  . Smoker   . Acute encephalopathy 12/04/2017  . HTN (hypertension) 12/04/2017  . AKI (acute kidney injury) ([place]) 12/04/2017  . Elevated PSA 12/04/2017  . Rhabdomyolysis 12/04/2017    Orientation RESPIRATION BLADDER Height & Weight     Self,Time,Situation,Place  Normal Incontinent Weight:   Height:     BEHAVIORAL SYMPTOMS/MOOD NEUROLOGICAL  BOWEL NUTRITION STATUS      Continent Regular Diet   AMBULATORY STATUS COMMUNICATION OF NEEDS Skin   Limited Assist Verbally Normal                       Personal Care Assistance Level of Assistance    Bathing Assistance: Limited assistance Feeding assistance: Limited assistance Dressing Assistance: Limited assistance Total Care Assistance: Limited assistance   Functional Limitations Info  Sight,Hearing,Speech Sight Info: Adequate Hearing Info: Adequate Speech Info: Adequate    SPECIAL CARE FACTORS FREQUENCY                       Contractures Contractures Info: Not present    Additional Factors Info  Code Status,Allergies Code Status Info: FULL Allergies Info: NKA           Current Medications (10/26/2020):  This is the current hospital active medication list Current Facility-Administered Medications  Medication Dose Route Frequency Provider Last Rate Last Admin  . acetaminophen (TYLENOL) tablet 650 mg  650 mg Oral Q6H PRN Shela Leff, MD       Or  . acetaminophen (TYLENOL) suppository 650 mg  650 mg Rectal Q6H PRN Shela Leff, MD      . amLODipine (NORVASC) tablet 5 mg  5 mg Oral Daily Rai, Ripudeep K, MD   5 mg at 10/26/20 0915  . atorvastatin (LIPITOR) tablet 40 mg  40 mg Oral q morning Shela Leff, MD   40  mg at 10/26/20 0915  . ferumoxytol (FERAHEME) 510 mg in sodium chloride 0.9 % 100 mL IVPB  510 mg Intravenous Once Gribbin, Sarah J, PA-C      . finasteride (PROSCAR) tablet 5 mg  5 mg Oral Daily Shela Leff, MD   5 mg at 10/26/20 0915  . haloperidol lactate (HALDOL) injection 1 mg  1 mg Intravenous Q6H PRN Rai, Ripudeep K, MD   1 mg at 10/25/20 1128  . hydrALAZINE (APRESOLINE) injection 10 mg  10 mg Intravenous Q6H PRN Rai, Ripudeep K, MD      . pantoprazole (PROTONIX) EC tablet 40 mg  40 mg Oral Daily Shela Leff, MD   40 mg at 10/26/20 0915  . QUEtiapine (SEROQUEL) tablet 12.5 mg  12.5 mg Oral QHS Rai, Ripudeep K, MD    12.5 mg at 10/25/20 2100     Discharge Medications: TAKE these medications   acetaminophen 500 MG tablet Commonly known as: TYLENOL Take 500-1,000 mg by mouth every 8 (eight) hours as needed for mild pain.   amLODipine 5 MG tablet Commonly known as: NORVASC Take 1 tablet (5 mg total) by mouth daily.   atorvastatin 40 MG tablet Commonly known as: LIPITOR Take 40 mg by mouth every morning.   clopidogrel 75 MG tablet Commonly known as: PLAVIX Take 1 tablet (75 mg total) by mouth every morning. Start taking on: October 30, 2020 What changed: These instructions start on October 30, 2020. If you are unsure what to do until then, ask your doctor or other care provider.   ferrous sulfate 325 (65 FE) MG tablet Take 1 tablet (325 mg total) by mouth 2 (two) times daily with a meal. What changed:   when to take this  additional instructions   finasteride 5 MG tablet Commonly known as: PROSCAR Take 5 mg by mouth daily.   guaiFENesin 100 MG/5ML Soln Commonly known as: ROBITUSSIN Take 10 mLs by mouth every 4 (four) hours as needed for cough or to loosen phlegm.   loperamide 2 MG capsule Commonly known as: IMODIUM Take 2 mg by mouth as needed for diarrhea or loose stools (not to exceed 8 doses in 24hrs).   magnesium hydroxide 400 MG/5ML suspension Commonly known as: MILK OF MAGNESIA Take 30 mLs by mouth at bedtime as needed for mild constipation.   neomycin-bacitracin-polymyxin Oint Commonly known as: NEOSPORIN Apply 1 application topically as needed for wound care.   pantoprazole 40 MG tablet Commonly known as: PROTONIX Take 40 mg by mouth daily.   QUEtiapine 25 MG tablet Commonly known as: SEROQUEL Take 0.5 tablets (12.5 mg total) by mouth at bedtime.        Relevant Imaging Results:  Relevant Lab Results:   Additional Information SSN [id number]  Vinie Sill, LCSW

## 2020-10-26 NOTE — TOC Initial Note (Signed)
Transition of Care [facility]) - Initial/Assessment Note    Patient Details  Name: Karl Miller MRN: [id number] Date of Birth: 01-Dec-1955  [facility]) CM/SW Contact:    Vinie Sill, LCSW Phone Number: 10/26/2020, 12:13 PM  Clinical Narrative:                  CSW soke with patient's brother,Jeff. CSW introduced self and explained role. Patient's brother confirmed patient is form [place] and agreeable to patient returning.   CSW spoke with [facility]- informed of discharge today- CSW sent discharge summary and FL2 for review- waiting on confirmation.  Thurmond Butts, MSW, LCSW Clinical Social Worker   Expected Discharge Plan: Assisted Living     Patient Goals and CMS Choice        Expected Discharge Plan and Services Expected Discharge Plan: Assisted Living       Living arrangements for the past 2 months: [place] Expected Discharge Date: 10/26/20                                    Prior Living Arrangements/Services Living arrangements for the past 2 months: [place] Lives with:: Self Patient language and need for interpreter reviewed:: No              Criminal Activity/Legal Involvement Pertinent to Current Situation/Hospitalization: No - Comment as needed  Activities of Daily Living Home Assistive Devices/Equipment: Other (Comment) (unable to determine) ADL Screening (condition at time of admission) Patient's cognitive ability adequate to safely complete daily activities?: No Is the patient deaf or have difficulty hearing?: No Does the patient have difficulty seeing, even when wearing glasses/contacts?: No Does the patient have difficulty concentrating, remembering, or making decisions?: Yes Patient able to express need for assistance with ADLs?: Yes Does the patient have difficulty dressing or bathing?: Yes Independently performs ADLs?: No Communication: Independent Does the  patient have difficulty walking or climbing stairs?: Yes Weakness of Legs: Both Weakness of Arms/Hands: Both  Permission Sought/Granted      Share Information with NAME: Dravon Nott     Permission granted to share info w Relationship: brother  Permission granted to share info w Contact Information: [phone]  Emotional Assessment         Alcohol / Substance Use: Not Applicable Psych Involvement: No (comment)  Admission diagnosis:  GI bleed [K92.2] GIB (gastrointestinal bleeding) [K92.2] Symptomatic anemia [D64.9] Gastrointestinal hemorrhage, unspecified gastrointestinal hemorrhage type [K92.2] Patient Active Problem List   Diagnosis Date Noted  . GIB (gastrointestinal bleeding) 10/25/2020  . Symptomatic anemia 02/06/2020  . Angiodysplasia of intestine with hemorrhage   . Acute on chronic blood loss anemia 07/30/2019  . Gastric and duodenal angiodysplasia with hemorrhage   . Heme positive stool   . Anemia   . Vascular dementia without behavioral disturbance ([place])   . Advanced care planning/counseling discussion   . Goals of care, counseling/discussion   . 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[place])   . Palliative care by specialist   . GI bleed 02/08/2019  . CVA (cerebral vascular accident) ([place]) 11/15/2018  . Benign essential HTN   . Tobacco abuse   . TIA (transient ischemic attack)   . Diastolic dysfunction   . Prediabetes   . Acute blood loss anemia   . Hyperlipidemia   . Syphilis   . Cerebral embolism with cerebral infarction 12/05/2017  . Smoker   . Acute encephalopathy  12/04/2017  . HTN (hypertension) 12/04/2017  . AKI (acute kidney injury) ([place]) 12/04/2017  . Elevated PSA 12/04/2017  . Rhabdomyolysis 12/04/2017   PCP:  Administration, [facility]:   [id number] [place], [place] [address] Phone: [phone] Fax: [phone]  [place], [place] - [place]  [address] [place] [address] Phone: [phone] Fax: [phone]  [place], [place] - [address] [place] [address] Phone: [phone] Fax: [phone]     Social Determinants of Health (SDOH) Interventions    Readmission Risk Interventions No flowsheet data found.
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
## 2020-10-26 NOTE — Discharge Summary (Signed)
Physician Discharge Summary   Patient ID: Karl Miller MRN: [id number] DOB/AGE: 1956-07-03 65 y.o.  Admit date: 10/24/2020 Discharge date: 10/26/2020  Primary Care Physician:  Administration, [facility]   Recommendations for Outpatient Follow-up:  1. Follow up with PCP in 1-2 weeks 2. Please obtain CBC in 1 week. 3. Ambulatory referral has been sent to hematology for as needed transfusions to keep hemoglobin above 7, as needed IV iron infusions 4. Resume Plavix on 10/30/2020  Home Health: Patient returning to assisted living facility Equipment/Devices:   Discharge Condition: stable  CODE STATUS: FULL Diet recommendation: Heart healthy diet   Discharge Diagnoses:    . Recurrent GIB (gastrointestinal bleeding) . HTN (hypertension) . Acute on chronic blood loss anemia . Hyperlipidemia . CVA (cerebral vascular accident) ([place]) . Mild acute kidney injury BPH History of dementia with sundowning   Consults: Gastroenterology, Dr. Ardis Hughs    Allergies:  No Known Allergies   DISCHARGE MEDICATIONS: Allergies as of 10/26/2020   No Known Allergies     Medication List    STOP taking these medications   lisinopril 10 MG tablet Commonly known as: ZESTRIL     TAKE these medications   acetaminophen 500 MG tablet Commonly known as: TYLENOL Take 500-1,000 mg by mouth every 8 (eight) hours as needed for mild pain.   amLODipine 5 MG tablet Commonly known as: NORVASC Take 1 tablet (5 mg total) by mouth daily.   atorvastatin 40 MG tablet Commonly known as: LIPITOR Take 40 mg by mouth every morning.   clopidogrel 75 MG tablet Commonly known as: PLAVIX Take 1 tablet (75 mg total) by mouth every morning. Start taking on: October 30, 2020 What changed: These instructions start on October 30, 2020. If you are unsure what to do until then, ask your doctor or other care provider.   ferrous sulfate 325 (65 FE) MG tablet Take 1 tablet (325 mg total) by mouth 2 (two) times daily with a  meal. What changed:   when to take this  additional instructions   finasteride 5 MG tablet Commonly known as: PROSCAR Take 5 mg by mouth daily.   guaiFENesin 100 MG/5ML Soln Commonly known as: ROBITUSSIN Take 10 mLs by mouth every 4 (four) hours as needed for cough or to loosen phlegm.   loperamide 2 MG capsule Commonly known as: IMODIUM Take 2 mg by mouth as needed for diarrhea or loose stools (not to exceed 8 doses in 24hrs).   magnesium hydroxide 400 MG/5ML suspension Commonly known as: MILK OF MAGNESIA Take 30 mLs by mouth at bedtime as needed for mild constipation.   neomycin-bacitracin-polymyxin Oint Commonly known as: NEOSPORIN Apply 1 application topically as needed for wound care.   pantoprazole 40 MG tablet Commonly known as: PROTONIX Take 40 mg by mouth daily.   QUEtiapine 25 MG tablet Commonly known as: SEROQUEL Take 0.5 tablets (12.5 mg total) by mouth at bedtime.        Brief H and P: For complete details please refer to admission H and P, but in brief Patient is a 65 year old male with history of dementia, resident of memory care unit, angiodysplasia/small bowel AVMs, recurrent GI bleed, hypertension, hyperlipidemia, history of TIA and stroke was sent to the ED from his nursing home after blood work done over there revealed low hemoglobin.   In ED, hemoglobin 7.0, was above 9 on labs done in 01/2020.  At nursing home, hemoglobin was low 6.0. Patient was unable to provide any history due to dementia.  Patient  was admitted for possible recurrent occult GI bleeding.  Hospital Course:   Acute on chronic blood loss anemia, recurrent GIB (gastrointestinal bleeding) -Prior history of GI bleed in 01/2020.  EGD revealed a single nonbleeding angiectasia in the stomach.  Capsule endoscopy revealed scattered few small nonbleeding AVMs throughout the small bowel without evidence of active bleeding. -Patient is on Plavix as well for prior history of TIA and CVA,.   Plavix was held -Patient was transfused 2 units packed RBCs, 1 unit of IV iron.   -Hemoglobin 9.2 at the time of discharge. -GI was consulted, patient was seen by Dr. Ardis Hughs.  Patient had extensive GI work-up in 2021, recommended increase to daily iron supplementation, as needed packed RBC transfusion and IV iron.  Ambulatory referral sent to hematology at cancer center however can be done at his facility or through his PCP. -Per GI, he has known scattered AVMs throughout his small bowel and does almost certainly lose blood intermittently especially his being on Plavix.  No plans for invasive GI testing now and recommended against them in the future unless he has overt significant GI bleeding. -Can restart Plavix in 3 or 4 days however also recommended considering not resuming the Plavix.  This will need patient and family's discussion with his PCP, risk versus benefits.     HTN (hypertension) -Continue amlodipine 5 mg daily  Mild acute kidney injury -Creatinine at baseline 1.1-1.2.  1.21 on 02/08/2020, likely due to anemia -Lisinopril was discontinued    Hyperlipidemia -Continue statin  Prior history of TIA, CVA (cerebral vascular accident) ([place]) -Continue statin, BP control, Plavix has been held, resume on 4/12   BPH -Continue finasteride   History of dementia -Patient was placed on Seroquel 12.5 mg at bedtime for sundowning   Day of Discharge S: No acute complaints, ambulated in the hallway with assistance with PT.  Appears close to his baseline  BP (!) 151/102 (BP Location: Left Arm)   Pulse (!) 58   Temp 98.3 F (36.8 C) (Oral)   Resp 16   SpO2 99%   Physical Exam: General: Alert and awake, NAD CVS: S1-S2 clear no murmur rubs or gallops Chest: clear to auscultation bilaterally, no wheezing rales or rhonchi Abdomen: soft nontender, nondistended, normal bowel sounds Extremities: no cyanosis, clubbing or edema noted bilaterally Neuro: no new deficits    Get  Medicines reviewed and adjusted: Please take all your medications with you for your next visit with your Primary MD  Please request your Primary MD to go over all hospital tests and procedure/radiological results at the follow up. Please ask your Primary MD to get all Hospital records sent to his/her office.  If you experience worsening of your admission symptoms, develop shortness of breath, life threatening emergency, suicidal or homicidal thoughts you must seek medical attention immediately by calling 911 or calling your MD immediately  if symptoms less severe.  You must read complete instructions/literature along with all the possible adverse reactions/side effects for all the Medicines you take and that have been prescribed to you. Take any new Medicines after you have completely understood and accept all the possible adverse reactions/side effects.   Do not drive when taking pain medications.   Do not take more than prescribed Pain, Sleep and Anxiety Medications  Special Instructions: If you have smoked or chewed Tobacco  in the last 2 yrs please stop smoking, stop any regular Alcohol  and or any Recreational drug use.  Wear Seat belts while driving.  Please  note  You were cared for by a hospitalist during your hospital stay. Once you are discharged, your primary care physician will handle any further medical issues. Please note that NO REFILLS for any discharge medications will be authorized once you are discharged, as it is imperative that you return to your primary care physician (or establish a relationship with a primary care physician if you do not have one) for your aftercare needs so that they can reassess your need for medications and monitor your lab values.   The results of significant diagnostics from this hospitalization (including imaging, microbiology, ancillary and laboratory) are listed below for reference.      Procedures/Studies:   No results found.    LAB  RESULTS: Basic Metabolic Panel: Recent Labs  Lab 10/25/20 1413 10/26/20 0238  NA 140 140  K 4.1 3.7  CL 108 105  CO2 28 26  GLUCOSE 81 84  BUN 13 10  CREATININE 1.18 1.24  CALCIUM 9.5 9.7   Liver Function Tests: Recent Labs  Lab 10/24/20 1757  AST 26  ALT 15  ALKPHOS 61  BILITOT 0.1*  PROT 6.7  ALBUMIN 3.4*   No results for input(s): LIPASE, AMYLASE in the last 168 hours. No results for input(s): AMMONIA in the last 168 hours. CBC: Recent Labs  Lab 10/24/20 1757 10/25/20 1413 10/26/20 0238  WBC 4.4 6.0 6.8  NEUTROABS 2.4  --   --   HGB 7.0* 8.9* 9.2*  HCT 26.0* 29.5* 30.5*  MCV 74.7* 74.1* 74.0*  PLT 295 265 271   Cardiac Enzymes: No results for input(s): CKTOTAL, CKMB, CKMBINDEX, TROPONINI in the last 168 hours. BNP: Invalid input(s): POCBNP CBG: No results for input(s): GLUCAP in the last 168 hours.     Disposition and Follow-up: Discharge Instructions    Ambulatory referral to Hematology / Oncology   Complete by: As directed    Increase activity slowly   Complete by: As directed        DISPOSITION: To memory care [place] Follow up in 2 week(s).   Contact information: [place] [address] [phone]                Time coordinating discharge:  35 minutes  Signed:   Estill Cotta M.D. [facility] 10/26/2020, 10:49 AM

## 2020-10-26 NOTE — NC FL2 (Signed)
[place] LEVEL OF CARE SCREENING TOOL     IDENTIFICATION  Patient Name: Karl Miller Birthdate: 1956/01/15 Sex: male Admission Date (Current Location): 10/24/2020  [facility] and [place] Number:  Herbalist and Address:  [facility] [place]. [facility], [place] [address] [place], [place] [address]      Provider Number: [id number]  Attending Physician Name and Address:  Mendel Corning, MD  Relative Name and Phone Number:       Current Level of Care: Hospital Recommended Level of Care: Assisted Living [facility] Care Prior Approval Number:    Date Approved/Denied:   PASRR Number:    Discharge Plan: Other (Comment) (ALF- Memory Care)    Current Diagnoses: Patient Active Problem List   Diagnosis Date Noted  . GIB (gastrointestinal bleeding) 10/25/2020  . Symptomatic anemia 02/06/2020  . Angiodysplasia of intestine with hemorrhage   . Acute on chronic blood loss anemia 07/30/2019  . Gastric and duodenal angiodysplasia with hemorrhage   . Heme positive stool   . Anemia   . Vascular dementia without behavioral disturbance ([place])   . Advanced care planning/counseling discussion   . Goals of care, counseling/discussion   . 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[place])   . Palliative care by specialist   . GI bleed 02/08/2019  . CVA (cerebral vascular accident) ([place]) 11/15/2018  . Benign essential HTN   . Tobacco abuse   . TIA (transient ischemic attack)   . Diastolic dysfunction   . Prediabetes   . Acute blood loss anemia   . Hyperlipidemia   . Syphilis   . Cerebral embolism with cerebral infarction 12/05/2017  . Smoker   . Acute encephalopathy 12/04/2017  . HTN (hypertension) 12/04/2017  . AKI (acute kidney injury) ([place]) 12/04/2017  . Elevated PSA 12/04/2017  . Rhabdomyolysis 12/04/2017    Orientation RESPIRATION BLADDER Height & Weight     Self,Time,Situation,Place  Normal Incontinent Weight:   Height:     BEHAVIORAL SYMPTOMS/MOOD NEUROLOGICAL  BOWEL NUTRITION STATUS      Continent Diet (heart healthy, thin liquids)  AMBULATORY STATUS COMMUNICATION OF NEEDS Skin   Limited Assist Verbally Normal                       Personal Care Assistance Level of Assistance    Bathing Assistance: Limited assistance Feeding assistance: Limited assistance Dressing Assistance: Limited assistance Total Care Assistance: Limited assistance   Functional Limitations Info  Sight,Hearing,Speech Sight Info: Adequate Hearing Info: Adequate Speech Info: Adequate    SPECIAL CARE FACTORS FREQUENCY                       Contractures Contractures Info: Not present    Additional Factors Info  Code Status,Allergies Code Status Info: FULL Allergies Info: NKA           Current Medications (10/26/2020):  This is the current hospital active medication list Current Facility-Administered Medications  Medication Dose Route Frequency Provider Last Rate Last Admin  . acetaminophen (TYLENOL) tablet 650 mg  650 mg Oral Q6H PRN Shela Leff, MD       Or  . acetaminophen (TYLENOL) suppository 650 mg  650 mg Rectal Q6H PRN Shela Leff, MD      . amLODipine (NORVASC) tablet 5 mg  5 mg Oral Daily Rai, Ripudeep K, MD   5 mg at 10/26/20 0915  . atorvastatin (LIPITOR) tablet 40 mg  40 mg Oral q morning Shela Leff, MD  40 mg at 10/26/20 0915  . ferumoxytol (FERAHEME) 510 mg in sodium chloride 0.9 % 100 mL IVPB  510 mg Intravenous Once Gribbin, Sarah J, PA-C      . finasteride (PROSCAR) tablet 5 mg  5 mg Oral Daily Shela Leff, MD   5 mg at 10/26/20 0915  . haloperidol lactate (HALDOL) injection 1 mg  1 mg Intravenous Q6H PRN Rai, Ripudeep K, MD   1 mg at 10/25/20 1128  . hydrALAZINE (APRESOLINE) injection 10 mg  10 mg Intravenous Q6H PRN Rai, Ripudeep K, MD      . pantoprazole (PROTONIX) EC tablet 40 mg  40 mg Oral Daily Shela Leff, MD   40 mg at 10/26/20 0915  . QUEtiapine (SEROQUEL) tablet 12.5 mg  12.5 mg Oral  QHS Rai, Ripudeep K, MD   12.5 mg at 10/25/20 2100     Discharge Medications: Please see discharge summary for a list of discharge medications.  Relevant Imaging Results:  Relevant Lab Results:   Additional Information SSN [id number]  Vinie Sill, LCSW

## 2020-10-26 NOTE — TOC Transition Note (Signed)
Transition of Care [facility]) - CM/SW Discharge Note   Patient Details  Name: Karl Miller MRN: [id number] Date of Birth: April 27, 1956  [facility] CM/SW Contact:  Vinie Sill, LCSW Phone Number: 10/26/2020, 12:42 PM   Clinical Narrative:    Patient will Discharge to: [facility] Discharge Date: 10/26/2020 Family Notified: Jeff,brother  Transport JL:UNGB  Per MD patient is ready for discharge. RN, patient, and facility notified of discharge. Discharge Summary sent to facility. RN given number for [phone]. Ambulance transport requested for patient.   Clinical Social Worker signing off.  Thurmond Butts, MSW, LCSW Clinical Social Worker    Final next level of care: Assisted Living Barriers to Discharge: Barriers Resolved   Patient Goals and CMS Choice        Discharge Placement              Patient chooses bed at:  [facility]) Patient to be transferred to facility by: [place] Name of family member notified: Jeff,brother Patient and family notified of of transfer: 10/26/20  Discharge Plan and Services                                     Social Determinants of Health (SDOH) Interventions     Readmission Risk Interventions No flowsheet data found.

## 2020-10-26 NOTE — Progress Notes (Signed)
Report called and given to Crystal at [facility], Fuller Canada, RN

## 2020-11-11 ENCOUNTER — Other Ambulatory Visit: Payer: Self-pay

## 2020-11-11 ENCOUNTER — Emergency Department (HOSPITAL_COMMUNITY)
Admission: EM | Admit: 2020-11-11 | Discharge: 2020-11-12 | Disposition: A | Discharge status: Home / Self Care | Payer: No Typology Code available for payment source | Attending: Emergency Medicine | Admitting: Emergency Medicine

## 2020-11-11 ENCOUNTER — Encounter (HOSPITAL_COMMUNITY): Payer: Self-pay

## 2020-11-11 ENCOUNTER — Ambulatory Visit (HOSPITAL_COMMUNITY)
Admission: EM | Admit: 2020-11-11 | Discharge: 2020-11-11 | Disposition: A | Discharge status: Home / Self Care | Payer: No Typology Code available for payment source | Source: Ambulatory Visit | Attending: Emergency Medicine | Admitting: Emergency Medicine

## 2020-11-11 DIAGNOSIS — T7421XA Adult sexual abuse, confirmed, initial encounter: Secondary | ICD-10-CM | POA: Insufficient documentation

## 2020-11-11 DIAGNOSIS — F0150 Vascular dementia without behavioral disturbance: Secondary | ICD-10-CM | POA: Insufficient documentation

## 2020-11-11 DIAGNOSIS — F17210 Nicotine dependence, cigarettes, uncomplicated: Secondary | ICD-10-CM | POA: Insufficient documentation

## 2020-11-11 DIAGNOSIS — Z7902 Long term (current) use of antithrombotics/antiplatelets: Secondary | ICD-10-CM | POA: Insufficient documentation

## 2020-11-11 DIAGNOSIS — Z79899 Other long term (current) drug therapy: Secondary | ICD-10-CM | POA: Diagnosis not present

## 2020-11-11 DIAGNOSIS — I10 Essential (primary) hypertension: Secondary | ICD-10-CM | POA: Insufficient documentation

## 2020-11-11 DIAGNOSIS — Z0441 Encounter for examination and observation following alleged adult rape: Secondary | ICD-10-CM | POA: Diagnosis present

## 2020-11-11 DIAGNOSIS — K625 Hemorrhage of anus and rectum: Secondary | ICD-10-CM | POA: Insufficient documentation

## 2020-11-11 LAB — COMPREHENSIVE METABOLIC PANEL
ALT: 17 U/L (ref 0–44)
AST: 20 U/L (ref 15–41)
Albumin: 3.5 g/dL (ref 3.5–5.0)
Alkaline Phosphatase: 49 U/L (ref 38–126)
Anion gap: 7 (ref 5–15)
BUN: 16 mg/dL (ref 8–23)
CO2: 26 mmol/L (ref 22–32)
Calcium: 10.2 mg/dL (ref 8.9–10.3)
Chloride: 110 mmol/L (ref 98–111)
Creatinine, Ser: 1.36 mg/dL — ABNORMAL HIGH (ref 0.61–1.24)
GFR, Estimated: 57 mL/min — ABNORMAL LOW (ref >60)
Glucose, Bld: 80 mg/dL (ref 70–99)
Potassium: 4.1 mmol/L (ref 3.5–5.1)
Sodium: 143 mmol/L (ref 135–145)
Total Bilirubin: 0.4 mg/dL (ref 0.3–1.2)
Total Protein: 7.3 g/dL (ref 6.5–8.1)

## 2020-11-11 LAB — TYPE AND SCREEN
ABO/RH(D): O NEG
Antibody Screen: POSITIVE

## 2020-11-11 LAB — CBC WITH DIFFERENTIAL/PLATELET
Abs Immature Granulocytes: 0.01 10*3/uL (ref 0.00–0.07)
Basophils Absolute: 0 10*3/uL (ref 0.0–0.1)
Basophils Relative: 1 %
Eosinophils Absolute: 0.3 10*3/uL (ref 0.0–0.5)
Eosinophils Relative: 6 %
HCT: 33.9 % — ABNORMAL LOW (ref 39.0–52.0)
Hemoglobin: 10.1 g/dL — ABNORMAL LOW (ref 13.0–17.0)
Immature Granulocytes: 0 %
Lymphocytes Relative: 14 %
Lymphs Abs: 0.8 10*3/uL (ref 0.7–4.0)
MCH: 23.6 pg — ABNORMAL LOW (ref 26.0–34.0)
MCHC: 29.8 g/dL — ABNORMAL LOW (ref 30.0–36.0)
MCV: 79.2 fL — ABNORMAL LOW (ref 80.0–100.0)
Monocytes Absolute: 0.6 10*3/uL (ref 0.1–1.0)
Monocytes Relative: 10 %
Neutro Abs: 4.1 10*3/uL (ref 1.7–7.7)
Neutrophils Relative %: 69 %
Platelets: 375 10*3/uL (ref 150–400)
RBC: 4.28 MIL/uL (ref 4.22–5.81)
RDW: 24.6 % — ABNORMAL HIGH (ref 11.5–15.5)
WBC: 5.9 10*3/uL (ref 4.0–10.5)
nRBC: 0 % (ref 0.0–0.2)

## 2020-11-11 LAB — POC OCCULT BLOOD, ED: Fecal Occult Bld: NEGATIVE
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
## 2020-11-11 NOTE — ED Notes (Signed)
Pt in bed talking with provider, respirations even and unlabored. Brother at bedside.

## 2020-11-11 NOTE — ED Notes (Signed)
Sane RN in with pt, not able to get vitals at this time due to sane RN interviewing pt

## 2020-11-11 NOTE — ED Notes (Signed)
[facility] called for transport, spoke with [employer]

## 2020-11-11 NOTE — ED Notes (Signed)
Pt soiled brief changed, and dinner tray given.
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
## 2020-11-11 NOTE — ED Notes (Signed)
PTAR cancelled for pt
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
## 2020-11-11 NOTE — ED Notes (Signed)
Upon entering pt room, pt sitting at foot of bed. Pt linen wet, linen changed and brief changed. Male purewick device applied to pt. Pt resting in bed with bed alarm on and denies the need for anything at present.
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
## 2020-11-12 MED ORDER — PANTOPRAZOLE SODIUM 40 MG PO TBEC
40.0000 mg | DELAYED_RELEASE_TABLET | Freq: Every day | ORAL | Status: DC
  Administered 2020-11-12: 40 mg via ORAL
  Filled 2020-11-12: qty 1

## 2020-11-12 MED ORDER — LISINOPRIL 10 MG PO TABS
10.0000 mg | ORAL_TABLET | Freq: Every day | ORAL | Status: DC
  Administered 2020-11-12: 10 mg via ORAL
  Filled 2020-11-12: qty 1

## 2020-11-12 MED ORDER — ATORVASTATIN CALCIUM 40 MG PO TABS
40.0000 mg | ORAL_TABLET | Freq: Every morning | ORAL | Status: DC
  Administered 2020-11-12: 40 mg via ORAL
  Filled 2020-11-12: qty 1

## 2020-11-12 MED ORDER — FERROUS SULFATE 325 (65 FE) MG PO TABS
325.0000 mg | ORAL_TABLET | Freq: Two times a day (BID) | ORAL | Status: DC
  Administered 2020-11-12 (×2): 325 mg via ORAL
  Filled 2020-11-12 (×2): qty 1

## 2020-11-12 MED ORDER — QUETIAPINE FUMARATE 25 MG PO TABS
12.5000 mg | ORAL_TABLET | Freq: Every day | ORAL | Status: DC
  Administered 2020-11-12: 12.5 mg via ORAL
  Filled 2020-11-12: qty 1

## 2020-11-12 MED ORDER — CLOPIDOGREL BISULFATE 75 MG PO TABS
75.0000 mg | ORAL_TABLET | Freq: Every morning | ORAL | Status: DC
  Administered 2020-11-12: 75 mg via ORAL
  Filled 2020-11-12: qty 1

## 2020-11-12 MED ORDER — FINASTERIDE 5 MG PO TABS
5.0000 mg | ORAL_TABLET | Freq: Every day | ORAL | Status: DC
  Administered 2020-11-12: 5 mg via ORAL
  Filled 2020-11-12: qty 1

## 2020-11-12 MED ORDER — AMLODIPINE BESYLATE 5 MG PO TABS
5.0000 mg | ORAL_TABLET | Freq: Every day | ORAL | Status: DC
  Administered 2020-11-12: 5 mg via ORAL
  Filled 2020-11-12: qty 1
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
## 2020-11-12 NOTE — ED Notes (Signed)
Offered patient bathroom and food/drink, patient state he is okay right now.
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
## 2020-11-12 NOTE — ED Notes (Signed)
PTAR called for transport back to [facility].  

## 2020-11-12 NOTE — ED Notes (Signed)
Called PTAR to check placement on the list.  Patient is currently 4th on the list.

## 2020-11-19 NOTE — SANE Note (Addendum)
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 143 135 - 145 mmol/L   Potassium 4.1 3.5 - 5.1 mmol/L   Chloride 110 98 - 111 mmol/L   CO2 26 22 - 32 mmol/L   Glucose, Bld 80 70 - 99 mg/dL   BUN 16 8 - 23 mg/dL   Creatinine, Ser 1.36 (H) 0.61 - 1.24 mg/dL   Calcium 10.2 8.9 - 10.3 mg/dL   Total Protein 7.3 6.5 - 8.1 g/dL   Albumin 3.5 3.5 - 5.0 g/dL   AST 20 15 - 41 U/L   ALT 17 0 - 44 U/L   Alkaline Phosphatase 49 38 - 126 U/L   Total Bilirubin 0.4 0.3 - 1.2 mg/dL   GFR, Estimated 57 (L) >60 mL/min   Anion gap 7 5 - 15  POC occult blood, ED Provider will collect  Result Value Ref Range   Fecal Occult Bld NEGATIVE NEGATIVE  Type and screen [place]  Result Value Ref Range   ABO/RH(D) O NEG    Antibody Screen POS    Sample Expiration 11/14/2020,2359    Antibody Identification      ANTI D ANTI C Performed at [facility], [place] [address], [place], [place] [address]    Other Evidence: Reference:none Additional Swabs(sent with kit to crime lab):none Clothing collected: Only the diaper that pt was wearing upon arrival to ED was collected.  Officer Karl Miller was informed that the sealed paper bag containing the diaper would need to be opened and dried in their crime lab. Additional Evidence given to Law Enforcement: None  HIV Risk Assessment: Medical Contraindications: Due to pt's extended history of GI issues and anemia, and multiple medications, Pt's brother elects not to start nPEP for HIV at this time.  Meds ordered this encounter  Medications  . DISCONTD: amLODipine (NORVASC) tablet 5 mg  . DISCONTD: atorvastatin (LIPITOR) tablet 40 mg  . DISCONTD: clopidogrel (PLAVIX) tablet 75 mg  . DISCONTD: ferrous sulfate tablet 325 mg  . DISCONTD: finasteride (PROSCAR) tablet 5 mg  . DISCONTD: lisinopril (ZESTRIL) tablet 10 mg  . DISCONTD: pantoprazole (PROTONIX)  EC tablet 40 mg  . DISCONTD: QUEtiapine (SEROQUEL) tablet 12.5 mg   Today's Vitals   11/12/20 1825 11/12/20 1900 11/12/20 2000 11/12/20 2050  BP: (!) 140/99 119/79 (!) 115/95   Pulse: 91 92 85   Resp: _0 Temp:    98 F (36.7 C)  TempSrc:    Oral  SpO2: 100% 98% 98%   Weight:      Height:      PainSc:   0-No pain 0-No pain   Inventory of Photographs: 1. Bookend/Staff ID/Pt ID 2. Pt facial photo 3. Anal area photo 4. Anal area photo 5. Bookend/Staff ID/Pt ID

## 2021-12-24 ENCOUNTER — Encounter (HOSPITAL_COMMUNITY): Payer: Self-pay

## 2021-12-24 ENCOUNTER — Observation Stay (HOSPITAL_COMMUNITY)
Admission: EM | Admit: 2021-12-24 | Discharge: 2021-12-27 | Disposition: A | Discharge status: Other Acute Inpatient Hospital | Payer: No Typology Code available for payment source | Attending: Internal Medicine | Admitting: Internal Medicine

## 2021-12-24 ENCOUNTER — Observation Stay (HOSPITAL_COMMUNITY): Payer: No Typology Code available for payment source

## 2021-12-24 DIAGNOSIS — D649 Anemia, unspecified: Secondary | ICD-10-CM

## 2021-12-24 DIAGNOSIS — D62 Acute posthemorrhagic anemia: Secondary | ICD-10-CM | POA: Diagnosis not present

## 2021-12-24 DIAGNOSIS — I129 Hypertensive chronic kidney disease with stage 1 through stage 4 chronic kidney disease, or unspecified chronic kidney disease: Secondary | ICD-10-CM | POA: Insufficient documentation

## 2021-12-24 DIAGNOSIS — N1831 Chronic kidney disease, stage 3a: Secondary | ICD-10-CM | POA: Insufficient documentation

## 2021-12-24 DIAGNOSIS — Z8673 Personal history of transient ischemic attack (TIA), and cerebral infarction without residual deficits: Secondary | ICD-10-CM | POA: Diagnosis not present

## 2021-12-24 DIAGNOSIS — E785 Hyperlipidemia, unspecified: Secondary | ICD-10-CM | POA: Diagnosis not present

## 2021-12-24 DIAGNOSIS — K219 Gastro-esophageal reflux disease without esophagitis: Secondary | ICD-10-CM | POA: Diagnosis not present

## 2021-12-24 DIAGNOSIS — Z79899 Other long term (current) drug therapy: Secondary | ICD-10-CM | POA: Diagnosis not present

## 2021-12-24 DIAGNOSIS — F0150 Vascular dementia without behavioral disturbance: Secondary | ICD-10-CM | POA: Insufficient documentation

## 2021-12-24 DIAGNOSIS — K5521 Angiodysplasia of colon with hemorrhage: Secondary | ICD-10-CM | POA: Diagnosis not present

## 2021-12-24 DIAGNOSIS — N400 Enlarged prostate without lower urinary tract symptoms: Secondary | ICD-10-CM | POA: Insufficient documentation

## 2021-12-24 DIAGNOSIS — K922 Gastrointestinal hemorrhage, unspecified: Secondary | ICD-10-CM

## 2021-12-24 DIAGNOSIS — I639 Cerebral infarction, unspecified: Secondary | ICD-10-CM | POA: Diagnosis present

## 2021-12-24 DIAGNOSIS — Z7902 Long term (current) use of antithrombotics/antiplatelets: Secondary | ICD-10-CM | POA: Diagnosis not present

## 2021-12-24 DIAGNOSIS — F17210 Nicotine dependence, cigarettes, uncomplicated: Secondary | ICD-10-CM | POA: Diagnosis not present

## 2021-12-24 LAB — CBC WITH DIFFERENTIAL/PLATELET
Abs Immature Granulocytes: 0.01 10*3/uL (ref 0.00–0.07)
Basophils Absolute: 0 10*3/uL (ref 0.0–0.1)
Basophils Relative: 1 %
Eosinophils Absolute: 0.3 10*3/uL (ref 0.0–0.5)
Eosinophils Relative: 7 %
HCT: 27.2 % — ABNORMAL LOW (ref 39.0–52.0)
Hemoglobin: 7.8 g/dL — ABNORMAL LOW (ref 13.0–17.0)
Immature Granulocytes: 0 %
Lymphocytes Relative: 16 %
Lymphs Abs: 0.7 10*3/uL (ref 0.7–4.0)
MCH: 21.8 pg — ABNORMAL LOW (ref 26.0–34.0)
MCHC: 28.7 g/dL — ABNORMAL LOW (ref 30.0–36.0)
MCV: 76 fL — ABNORMAL LOW (ref 80.0–100.0)
Monocytes Absolute: 0.6 10*3/uL (ref 0.1–1.0)
Monocytes Relative: 15 %
Neutro Abs: 2.7 10*3/uL (ref 1.7–7.7)
Neutrophils Relative %: 61 %
Platelets: 336 10*3/uL (ref 150–400)
RBC: 3.58 MIL/uL — ABNORMAL LOW (ref 4.22–5.81)
RDW: 21.9 % — ABNORMAL HIGH (ref 11.5–15.5)
WBC: 4.3 10*3/uL (ref 4.0–10.5)
nRBC: 0 % (ref 0.0–0.2)

## 2021-12-24 LAB — COMPREHENSIVE METABOLIC PANEL
ALT: 17 U/L (ref 0–44)
AST: 19 U/L (ref 15–41)
Albumin: 3.7 g/dL (ref 3.5–5.0)
Alkaline Phosphatase: 51 U/L (ref 38–126)
Anion gap: 6 (ref 5–15)
BUN: 19 mg/dL (ref 8–23)
CO2: 26 mmol/L (ref 22–32)
Calcium: 10.1 mg/dL (ref 8.9–10.3)
Chloride: 109 mmol/L (ref 98–111)
Creatinine, Ser: 1.33 mg/dL — ABNORMAL HIGH (ref 0.61–1.24)
GFR, Estimated: 58 mL/min — ABNORMAL LOW (ref >60)
Glucose, Bld: 94 mg/dL (ref 70–99)
Potassium: 4.2 mmol/L (ref 3.5–5.1)
Sodium: 141 mmol/L (ref 135–145)
Total Bilirubin: 0.4 mg/dL (ref 0.3–1.2)
Total Protein: 7.4 g/dL (ref 6.5–8.1)

## 2021-12-24 LAB — POC OCCULT BLOOD, ED: Fecal Occult Bld: POSITIVE — AB

## 2021-12-24 LAB — RETICULOCYTES
Immature Retic Fract: 28.7 % — ABNORMAL HIGH (ref 2.3–15.9)
RBC.: 3.52 MIL/uL — ABNORMAL LOW (ref 4.22–5.81)
Retic Count, Absolute: 69.7 10*3/uL (ref 19.0–186.0)
Retic Ct Pct: 2 % (ref 0.4–3.1)

## 2021-12-24 LAB — PROTIME-INR
INR: 1 (ref 0.8–1.2)
Prothrombin Time: 13.1 seconds (ref 11.4–15.2)

## 2021-12-24 LAB — IRON AND TIBC
Iron: 25 ug/dL — ABNORMAL LOW (ref 45–182)
Saturation Ratios: 6 % — ABNORMAL LOW (ref 17.9–39.5)
TIBC: 423 ug/dL (ref 250–450)
UIBC: 398 ug/dL

## 2021-12-24 LAB — VITAMIN B12: Vitamin B-12: 245 pg/mL (ref 180–914)

## 2021-12-24 LAB — CBG MONITORING, ED: Glucose-Capillary: 71 mg/dL (ref 70–99)

## 2021-12-24 LAB — FOLATE: Folate: 15.8 ng/mL (ref >5.9)

## 2021-12-24 LAB — FERRITIN: Ferritin: 5 ng/mL — ABNORMAL LOW (ref 24–336)

## 2021-12-24 LAB — PREPARE RBC (CROSSMATCH)

## 2021-12-24 MED ORDER — ATORVASTATIN CALCIUM 40 MG PO TABS
40.0000 mg | ORAL_TABLET | Freq: Every morning | ORAL | Status: DC
  Administered 2021-12-25 – 2021-12-26 (×2): 40 mg via ORAL
  Filled 2021-12-24 (×2): qty 1

## 2021-12-24 MED ORDER — OXYCODONE HCL 5 MG PO TABS: 5.0000 mg | ORAL_TABLET | ORAL | Status: DC | PRN

## 2021-12-24 MED ORDER — ONDANSETRON HCL 4 MG PO TABS: 4.0000 mg | ORAL_TABLET | Freq: Four times a day (QID) | ORAL | Status: DC | PRN

## 2021-12-24 MED ORDER — ACETAMINOPHEN 650 MG RE SUPP: 650.0000 mg | Freq: Four times a day (QID) | RECTAL | Status: DC | PRN

## 2021-12-24 MED ORDER — HYDRALAZINE HCL 20 MG/ML IJ SOLN
5.0000 mg | Freq: Four times a day (QID) | INTRAMUSCULAR | Status: DC | PRN
  Administered 2021-12-26: 5 mg via INTRAVENOUS
  Filled 2021-12-24: qty 1

## 2021-12-24 MED ORDER — ACETAMINOPHEN 325 MG PO TABS: 650.0000 mg | ORAL_TABLET | Freq: Four times a day (QID) | ORAL | Status: DC | PRN

## 2021-12-24 MED ORDER — SODIUM CHLORIDE 0.9 % IV SOLN: INTRAVENOUS | Status: DC

## 2021-12-24 MED ORDER — ONDANSETRON HCL 4 MG/2ML IJ SOLN: 4.0000 mg | Freq: Four times a day (QID) | INTRAMUSCULAR | Status: DC | PRN

## 2021-12-24 MED ORDER — PANTOPRAZOLE SODIUM 40 MG PO TBEC
40.0000 mg | DELAYED_RELEASE_TABLET | Freq: Every day | ORAL | Status: DC
  Administered 2021-12-25 – 2021-12-26 (×2): 40 mg via ORAL
  Filled 2021-12-24 (×2): qty 1

## 2021-12-24 MED ORDER — SODIUM CHLORIDE 0.9 % IV SOLN: 10.0000 mL/h | Freq: Once | INTRAVENOUS | Status: DC

## 2021-12-24 MED ORDER — FINASTERIDE 5 MG PO TABS
5.0000 mg | ORAL_TABLET | Freq: Every morning | ORAL | Status: DC
  Administered 2021-12-25 – 2021-12-26 (×2): 5 mg via ORAL
  Filled 2021-12-24 (×2): qty 1

## 2021-12-24 NOTE — ED Provider Triage Note (Signed)
Emergency Medicine Provider Triage Evaluation Note  Karl Miller , a 66 y.o. male  was evaluated in triage.  Pt complains of abnormal lab. Sent for nursing facility due to low hemoglobin. Does not report having any sxs. Poor historian  Review of Systems  Positive: As above Negative: As above  Physical Exam  BP (!) 130/99 (BP Location: Left Arm)   Pulse 68   Temp 98.4 F (36.9 C) (Oral)   Resp 16   SpO2 100%  Gen:   Awake, l Resp:  Normal effort  MSK:   normal Other:    Medical Decision Making  Medically screening exam initiated at 11:33 AM.  Appropriate orders placed.  Karl Miller was informed that the remainder of the evaluation will be completed by another provider, this initial triage assessment does not replace that evaluation, and the importance of remaining in the ED until their evaluation is complete.     Domenic Moras, PA-C 12/24/21 1143

## 2021-12-24 NOTE — Consult Note (Signed)
Referring Provider: Kerney Elbe, DO Primary Care Physician:  [facility], [facility] Primary Gastroenterologist:  [name]  Reason for Consultation:  Anemia, history of recurrent GI bleeds  HPI: Karl Miller is a 66 y.o. male who presents to the ED reportedly "needing blood transfusion."  Patient has dementia and is a poor historian, lives at [facility].  Labs in the ED show hemoglobin 7.8, down from 10.19 October 2020.  Low MCV at 76.  Normal platelets and PT/INR, no leukocytosis.  BUN 19, creatinine 1.33.  Patient's brother at bedside. States patient was sent to the hospital due to finding of low hemoglobin at nursing facility.  He has history of recurrent GI bleeding, last admitted for this April 2022, at which time GI was consulted and recommended supportive care with iron supplementation as patient had extensive work-up in 2021 for GI bleeding and had known AVMs.  Patient is alert and oriented to self but unable to state where he is.  He denies any abdominal pain, nausea, vomiting, chest pain.  Resting comfortably in bed.  29 of history provided by patient's brother and chart review.  Patient's brother denies any family history of GI malignancy or disease.  States patient is on Plavix due to history of small stroke  Denies any MI/stroke in the last 6 months.  Reports patient did not eat breakfast today, last meal was yesterday.  Patient unsure when his last bowel movement was but denies seeing any blood in the stool or dark black stools.  Brother states patient is on pured diet at nursing facility.  Previous GI Workup:  Colonoscopy 02/11/2019 - Poor prep, diverticulosis, 2 small polyps not resected.  Small bowel enteroscopy 08/04/2019 - Normal esophagus, 6 nonbleeding angioectasias in the duodenum, treated with APC.  2 nonbleeding angioectasias in the jejunum, treated with APC.  EGD 02/09/2020 - Normal esophagus, single nonbleeding angioectasia in the stomach, no source  of anemia or bleeding identified.  Capsule endoscopy 02/14/2020 - Scattered, few, small nonbleeding AVMs throughout the small bowel from proximal jejunum to distal ileum, no evidence of active bleeding.   Past Medical History:  Diagnosis Date   Acute encephalopathy 12/03/2017   [name] 12/04/2017   High cholesterol    Hypertension    Stroke [facility])    TIA (transient ischemic attack) 12/03/2017   [name] 12/04/2017    Past Surgical History:  Procedure Laterality Date   COLONOSCOPY WITH PROPOFOL N/A 02/11/2019   Procedure: COLONOSCOPY WITH PROPOFOL;  Surgeon: [name], MD;  Location: [facility];  Service: Gastroenterology;  Laterality: N/A;   ENTEROSCOPY N/A 08/04/2019   Procedure: ENTEROSCOPY;  Surgeon: [name], MD;  Location: [facility] ENDOSCOPY;  Service: Gastroenterology;  Laterality: N/A;   ESOPHAGOGASTRODUODENOSCOPY N/A 02/09/2020   Procedure: ESOPHAGOGASTRODUODENOSCOPY (EGD);  Surgeon: Arta Silence, MD;  Location: [facility] ENDOSCOPY;  Service: Endoscopy;  Laterality: N/A;   ESOPHAGOGASTRODUODENOSCOPY (EGD) WITH PROPOFOL N/A 02/11/2019   Procedure: ESOPHAGOGASTRODUODENOSCOPY (EGD) WITH PROPOFOL;  Surgeon: [name], MD;  Location: [facility] ENDOSCOPY;  Service: Gastroenterology;  Laterality: N/A;   [name] CAPSULE STUDY Left 02/10/2020   Procedure: [name] CAPSULE STUDY;  Surgeon: Arta Silence, MD;  Location: [facility] ENDOSCOPY;  Service: Endoscopy;  Laterality: Left;   HEMOSTASIS CLIP PLACEMENT  02/11/2019   Procedure: HEMOSTASIS CLIP PLACEMENT;  Surgeon: [name], MD;  Location: [place];  Service: Gastroenterology;;   HEMOSTASIS CONTROL  02/11/2019   Procedure: HEMOSTASIS CONTROL;  Surgeon: [name], MD;  Location: [place];  Service: Gastroenterology;;   HOT HEMOSTASIS  N/A 02/11/2019   Procedure: HOT HEMOSTASIS (ARGON PLASMA COAGULATION/BICAP);  Surgeon: [name], MD;  Location: [facility] ENDOSCOPY;  Service: Gastroenterology;  Laterality: N/A;   HOT HEMOSTASIS N/A 08/04/2019    Procedure: HOT HEMOSTASIS (ARGON PLASMA COAGULATION/BICAP);  Surgeon: [name], MD;  Location: [facility] ENDOSCOPY;  Service: Gastroenterology;  Laterality: N/A;   LOOP RECORDER INSERTION N/A 12/07/2017   Procedure: LOOP RECORDER INSERTION;  Surgeon: Constance Haw, MD;  Location: [place] CV LAB;  Service: Cardiovascular;  Laterality: N/A;   NO PAST SURGERIES     TEE WITHOUT CARDIOVERSION N/A 12/07/2017   Procedure: TRANSESOPHAGEAL ECHOCARDIOGRAM (TEE);  Surgeon: Dorothy Spark, MD;  Location: [facility] ENDOSCOPY;  Service: Cardiovascular;  Laterality: N/A;    Prior to Admission medications   Medication Sig Start Date End Date Taking? Authorizing Provider  alum & mag hydroxide-simeth (MAALOX/MYLANTA) 200-200-20 MG/5ML suspension Take 30 mLs by mouth every 6 (six) hours as needed for indigestion or heartburn.   Yes [provider]  amLODipine (NORVASC) 5 MG tablet Take 1 tablet (5 mg total) by mouth daily. Patient taking differently: Take 5 mg by mouth every morning. 10/26/20  Yes Rai, Ripudeep K, MD  atorvastatin (LIPITOR) 40 MG tablet Take 40 mg by mouth every morning. 01/11/20  Yes [provider]  clopidogrel (PLAVIX) 75 MG tablet Take 1 tablet (75 mg total) by mouth every morning. 10/30/20  Yes Rai, Ripudeep K, MD  ferrous sulfate 325 (65 FE) MG tablet Take 1 tablet (325 mg total) by mouth 2 (two) times daily with a meal. Patient taking differently: Take 325 mg by mouth every Monday, Wednesday, and Friday. 10/26/20  Yes Rai, Ripudeep K, MD  finasteride (PROSCAR) 5 MG tablet Take 5 mg by mouth every morning.   Yes [provider]  loperamide (IMODIUM) 2 MG capsule Take 2 mg by mouth as needed for diarrhea or loose stools (not to exceed 8 doses in 24hrs).   Yes [provider]  acetaminophen (TYLENOL) 500 MG tablet Take 500-1,000 mg by mouth every 6 (six) hours as needed for mild pain.    [provider]  guaiFENesin (ROBITUSSIN) 100 MG/5ML SOLN Take 10  mLs by mouth every 4 (four) hours as needed for cough or to loosen phlegm.    [provider]  lisinopril (ZESTRIL) 10 MG tablet Take 10 mg by mouth daily.    [provider]  magnesium hydroxide (MILK OF MAGNESIA) 400 MG/5ML suspension Take 30 mLs by mouth at bedtime as needed for mild constipation.     [provider]  neomycin-bacitracin-polymyxin (NEOSPORIN) OINT Apply 1 application topically as needed for wound care.    [provider]  pantoprazole (PROTONIX) 40 MG tablet Take 40 mg by mouth daily.    [provider]  QUEtiapine (SEROQUEL) 25 MG tablet Take 0.5 tablets (12.5 mg total) by mouth at bedtime. Patient taking differently: Take 25 mg by mouth at bedtime. 10/26/20   Rai, Vernelle Emerald, MD    Scheduled Meds: Continuous Infusions:  sodium chloride     PRN Meds:.  Allergies as of 12/24/2021   (No Known Allergies)    History reviewed. No pertinent family history.  Social History   Socioeconomic History   Marital status: Single    Spouse name: Not on file   Number of children: Not on file   Years of education: Not on file   Highest education level: Not on file  Occupational History   Not on file  Tobacco  Use   Smoking status: Every Day    Packs/day: 0.50    Years: 24.00    Pack years: 12.00    Types: Cigarettes   Smokeless tobacco: Never  Vaping Use   Vaping Use: Never used  Substance and Sexual Activity   Alcohol use: No   Drug use: No   Sexual activity: Not Currently  Other Topics Concern   Not on file  Social History Narrative   Not on file   Social Determinants of Health   Financial Resource Strain: Not on file  Food Insecurity: Not on file  Transportation Needs: Not on file  Physical Activity: Not on file  Stress: Not on file  Social Connections: Not on file  Intimate Partner Violence: Not on file    Review of Systems: Review of Systems  Unable to perform ROS: Dementia    Physical Exam: Physical  Exam Vitals and nursing note reviewed.  Constitutional:      General: He is not in acute distress.    Appearance: Normal appearance. He is not ill-appearing.  HENT:     Head: Normocephalic and atraumatic.     Right Ear: External ear normal.     Left Ear: External ear normal.     Nose: Nose normal.     Mouth/Throat:     Mouth: Mucous membranes are moist.     Pharynx: Oropharynx is clear.  Eyes:     General: No scleral icterus.    Extraocular Movements: Extraocular movements intact.     Comments: Conjunctival pallor  Cardiovascular:     Rate and Rhythm: Normal rate and regular rhythm.     Pulses: Normal pulses.     Heart sounds: Normal heart sounds.  Pulmonary:     Effort: Pulmonary effort is normal.     Breath sounds: Normal breath sounds.  Abdominal:     General: Bowel sounds are normal. There is no distension.     Palpations: Abdomen is soft.     Tenderness: There is no abdominal tenderness. There is no guarding.  Musculoskeletal:     Cervical back: Normal range of motion and neck supple.     Right lower leg: No edema.     Left lower leg: No edema.  Skin:    General: Skin is warm and dry.  Neurological:     General: No focal deficit present.     Mental Status: He is alert and oriented to person, place, and time.  Psychiatric:        Mood and Affect: Mood normal.        Behavior: Behavior normal.     Vital signs: Vitals:   12/24/21 1240 12/24/21 1415  BP: 122/80 140/87  Pulse: 61 (!) 55  Resp: (!) 21 20  Temp:    SpO2: 100% 100%        GI:  Lab Results: Recent Labs    12/24/21 1243  WBC 4.3  HGB 7.8*  HCT 27.2*  PLT 336   BMET Recent Labs    12/24/21 1243  NA 141  K 4.2  CL 109  CO2 26  GLUCOSE 94  BUN 19  CREATININE 1.33*  CALCIUM 10.1   LFT Recent Labs    12/24/21 1243  PROT 7.4  ALBUMIN 3.7  AST 19  ALT 17  ALKPHOS 51  BILITOT 0.4   PT/INR Recent Labs    12/24/21 1243  LABPROT 13.1  INR 1.0     Studies/Results: No  results found.  Impression: Anemia - Hemoglobin 7.8, down from 10.1 10/2020. - MCV 76, platelets 336, no leukocytosis - BUN 19, creatinine 1.33, GFR 58 - History of multiple GI bleeds, see HPI for previous GI work-up. - Stool brown in the ED, no signs of overt bleeding, no visible hemorrhoids or fissures.   Plan: Patient with history of recurrent GI bleeding and known AVMs.  Extensive work-up 2021.  No evidence of overt GI bleeding.  Case discussed with Dr. Watt Climes. No plan for procedures or invasive testing at this time without evidence of significant overt bleeding. Recommend iron replacement therapy. Consider CT abdomen/pelvis. Recommend to reevaluate patient's blood thinner needs. Continue daily CBC and transfuse as needed to maintain HGB > 7.  [facility] will follow.     LOS: 0 days   Angelique Holm  PA-C 12/24/2021, 2:56 PM  Contact #  [phone]

## 2021-12-24 NOTE — ED Provider Notes (Addendum)
[facility] DEPT Provider Note   CSN: [id number] Arrival date & time: 12/24/21  1104     History  Chief Complaint  Patient presents with   Abnormal Lab    Karl Miller is a 66 y.o. male.  Level 5 caveat for dementia.  Patient is a poor historian.  Presents from his facility with complaint of "needing blood transfusion".  Mother at bedside reports labs were drawn yesterday and patient was told he is a blood transfusion.  Did not know why blood was drawn.  Patient does have a history of recurrent GI bleeds but denies any recent black or bloody stools.  No nausea or vomiting.  No chest pain or shortness of breath.  No dizziness or lightheadedness.  No abdominal pain or rectal pain.  Patient is a poor historian.  He takes Plavix but no other anticoagulation  The history is provided by the patient and a relative. The history is limited by the condition of the patient.  Abnormal Lab     Home Medications Prior to Admission medications   Medication Sig Start Date End Date Taking? Authorizing Provider  acetaminophen (TYLENOL) 500 MG tablet Take 500-1,000 mg by mouth every 6 (six) hours as needed for mild pain.    [provider]  amLODipine (NORVASC) 5 MG tablet Take 1 tablet (5 mg total) by mouth daily. 10/26/20   Rai, Vernelle Emerald, MD  atorvastatin (LIPITOR) 40 MG tablet Take 40 mg by mouth every morning. 01/11/20   [provider]  clopidogrel (PLAVIX) 75 MG tablet Take 1 tablet (75 mg total) by mouth every morning. 10/30/20   Rai, Vernelle Emerald, MD  ferrous sulfate 325 (65 FE) MG tablet Take 1 tablet (325 mg total) by mouth 2 (two) times daily with a meal. 10/26/20   Rai, Ripudeep K, MD  finasteride (PROSCAR) 5 MG tablet Take 5 mg by mouth daily.    [provider]  guaiFENesin (ROBITUSSIN) 100 MG/5ML SOLN Take 10 mLs by mouth every 4 (four) hours as needed for cough or to loosen phlegm.    [provider]  lisinopril (ZESTRIL) 10  MG tablet Take 10 mg by mouth daily.    [provider]  loperamide (IMODIUM) 2 MG capsule Take 2 mg by mouth as needed for diarrhea or loose stools (not to exceed 8 doses in 24hrs).    [provider]  magnesium hydroxide (MILK OF MAGNESIA) 400 MG/5ML suspension Take 30 mLs by mouth at bedtime as needed for mild constipation.     [provider]  neomycin-bacitracin-polymyxin (NEOSPORIN) OINT Apply 1 application topically as needed for wound care.    [provider]  pantoprazole (PROTONIX) 40 MG tablet Take 40 mg by mouth daily.    [provider]  QUEtiapine (SEROQUEL) 25 MG tablet Take 0.5 tablets (12.5 mg total) by mouth at bedtime. Patient taking differently: Take 25 mg by mouth at bedtime. 10/26/20   Mendel Corning, MD      Allergies    Patient has no known allergies.    Review of Systems   Review of Systems  Unable to perform ROS: Dementia   Physical Exam Updated Vital Signs BP (!) 130/99 (BP Location: Left Arm)   Pulse 68   Temp 98.4 F (36.9 C) (Oral)   Resp 16   SpO2 100%  Physical Exam Vitals and nursing note reviewed.  Constitutional:      General: He is not in acute distress.  Appearance: He is well-developed.     Comments: Oriented to person and place  HENT:     Head: Normocephalic and atraumatic.     Mouth/Throat:     Pharynx: No oropharyngeal exudate.  Eyes:     Conjunctiva/sclera: Conjunctivae normal.     Pupils: Pupils are equal, round, and reactive to light.  Neck:     Comments: No meningismus. Cardiovascular:     Rate and Rhythm: Normal rate and regular rhythm.     Heart sounds: Normal heart sounds. No murmur heard. Pulmonary:     Effort: Pulmonary effort is normal. No respiratory distress.     Breath sounds: Normal breath sounds.  Abdominal:     Palpations: Abdomen is soft.     Tenderness: There is no abdominal tenderness. There is no guarding or rebound.  Genitourinary:    Comments: Chaperone present  Gwyndolyn Saxon EMT.  No hemorrhoids or fissures.  Brown stool on rectal exam Musculoskeletal:        General: No tenderness. Normal range of motion.     Cervical back: Normal range of motion and neck supple.  Skin:    General: Skin is warm.  Neurological:     Mental Status: He is alert and oriented to person, place, and time.     Cranial Nerves: No cranial nerve deficit.     Motor: No abnormal muscle tone.     Coordination: Coordination normal.     Comments:  5/5 strength throughout. CN 2-12 intact.Equal grip strength.   Psychiatric:        Behavior: Behavior normal.    ED Results / Procedures / Treatments   Labs (all labs ordered are listed, but only abnormal results are displayed) Labs Reviewed  IRON AND TIBC - Abnormal; Notable for the following components:      Result Value   Iron 25 (*)    Saturation Ratios 6 (*)    All other components within normal limits  FERRITIN - Abnormal; Notable for the following components:   Ferritin 5 (*)    All other components within normal limits  RETICULOCYTES - Abnormal; Notable for the following components:   RBC. 3.52 (*)    Immature Retic Fract 28.7 (*)    All other components within normal limits  CBC WITH DIFFERENTIAL/PLATELET - Abnormal; Notable for the following components:   RBC 3.58 (*)    Hemoglobin 7.8 (*)    HCT 27.2 (*)    MCV 76.0 (*)    MCH 21.8 (*)    MCHC 28.7 (*)    RDW 21.9 (*)    All other components within normal limits  COMPREHENSIVE METABOLIC PANEL - Abnormal; Notable for the following components:   Creatinine, Ser 1.33 (*)    GFR, Estimated 58 (*)    All other components within normal limits  POC OCCULT BLOOD, ED - Abnormal; Notable for the following components:   Fecal Occult Bld POSITIVE (*)    All other components within normal limits  VITAMIN B12  FOLATE  PROTIME-INR  URINALYSIS, ROUTINE W REFLEX MICROSCOPIC  CBG MONITORING, ED  PREPARE RBC (CROSSMATCH)  TYPE AND SCREEN    EKG EKG  Interpretation  Date/Time:  Tuesday December 24 2021 12:37:18 EDT Ventricular Rate:  55 PR Interval:  225 QRS Duration: 113 QT Interval:  422 QTC Calculation: 404 R Axis:   70 Text Interpretation: Sinus rhythm Ventricular premature complex Prolonged PR interval Borderline intraventricular conduction delay No significant change was found Confirmed by Ezequiel Essex [phone]) on  12/24/2021 12:52:51 PM  Radiology No results found.  Procedures .Critical Care Performed by: Ezequiel Essex, MD Authorized by: Ezequiel Essex, MD   Critical care provider statement:    Critical care time (minutes):  35   Critical care time was exclusive of:  Separately billable procedures and treating other patients   Critical care was necessary to treat or prevent imminent or life-threatening deterioration of the following conditions: GI bleed requiring transfusion.   Critical care was time spent personally by me on the following activities:  Development of treatment plan with patient or surrogate, discussions with consultants, evaluation of patient's response to treatment, examination of patient, ordering and review of laboratory studies, ordering and review of radiographic studies, ordering and performing treatments and interventions, pulse oximetry, re-evaluation of patient's condition and review of old charts   I assumed direction of critical care for this patient from another provider in my specialty: no     Care discussed with: admitting provider      Medications Ordered in ED Medications - No data to display  ED Course/ Medical Decision Making/ A&P                           Patient sent from facility with report of anemia.  History of GI bleed in the past.  Vitals are stable.  EGD 2021: EGD revealed a single nonbleeding angiectasia in the stomach.  Capsule endoscopy revealed scattered few small nonbleeding AVMs throughout the small bowel without evidence of active bleeding.  Hemoglobin today is 7.9  which is down from the 10 range in the past.  Hemoccult is positive without evidence of gross blood.  Iron studies are low.  Vitals remained stable.  Discussed with patient and his brother at bedside who is his legal guardian.  They are agreeable to a blood transfusion.  Extensive GI work-up in the past that showed multiple AVMs throughout the small bowel and stomach.  Given patient's history of Plavix use as well as worsening anemia.  We will plan transfusion overnight observation.  Discussed with Dr. Alfredia Ferguson as well as gastroenterology Dr. Watt Climes.  No invasive procedures planned at this time      Final Clinical Impression(s) / ED Diagnoses Final diagnoses:  Gastrointestinal hemorrhage, unspecified gastrointestinal hemorrhage type  Acute on chronic anemia    Rx / DC Orders ED Discharge Orders     None         Kyzen Horn, Annie Main, MD 12/24/21 1640    Ezequiel Essex, MD 12/24/21 1824

## 2021-12-24 NOTE — H&P (Signed)
History and Physical    Patient: Karl Miller DVV:[id number] DOB: November 11, 1955 DOA: 12/24/2021 DOS: the patient was seen and examined on 12/24/2021 PCP: [facility]  Patient coming from: SNF  Chief Complaint:  Chief Complaint  Patient presents with   Abnormal Lab   HPI: Karl Miller is a 66 y.o. male with medical history significant for but not limited to history of CVA, hypertension hyperlipidemia and acute encephalopathy as well as other comorbidities and history of recurrent GI bleeding who presented to the hospital after he was found to have a low hemoglobin on labs from this facility.  Of note the patient is unable to provide a subjective history given his dementia and current condition and because he is a poor historian but he currently lives at [facility] was sent from them given concern for his low hemoglobin of down to 7.8.  He has a history of recurrent GI bleeding and was admitted last April 2022 at that time GI was consulted and recommended supportive care with iron supplementation and had extensive work-up 2021 for GI bleeding and known AVMs.  In the ED was typed and screened and transfused 1 unit of PRBCs and GI was consulted.  Patient is unable to provide any meaningful subjective history.  Patient's brother denies any GI malignancy at patient continues takes Plavix given his history of CVA.  Unsure if the patient's bowel movement was dark or black.  Patient's current diet at the nursing facility is pured.  TR[facility] was asked admit this patient for low hemoglobin and concern for GI bleeding.   Review of Systems: unable to review all systems due to the inability of the patient to answer questions. Past Medical History:  Diagnosis Date   Acute encephalopathy 12/03/2017   [name] 12/04/2017   High cholesterol    Hypertension    Stroke [facility])    TIA (transient ischemic attack) 12/03/2017   [name] 12/04/2017   Past Surgical History:  Procedure Laterality Date    COLONOSCOPY WITH PROPOFOL N/A 02/11/2019   Procedure: COLONOSCOPY WITH PROPOFOL;  Surgeon: [name], MD;  Location: [facility];  Service: Gastroenterology;  Laterality: N/A;   ENTEROSCOPY N/A 08/04/2019   Procedure: ENTEROSCOPY;  Surgeon: [name], MD;  Location: [facility] ENDOSCOPY;  Service: Gastroenterology;  Laterality: N/A;   ESOPHAGOGASTRODUODENOSCOPY N/A 02/09/2020   Procedure: ESOPHAGOGASTRODUODENOSCOPY (EGD);  Surgeon: Arta Silence, MD;  Location: [facility] ENDOSCOPY;  Service: Endoscopy;  Laterality: N/A;   ESOPHAGOGASTRODUODENOSCOPY (EGD) WITH PROPOFOL N/A 02/11/2019   Procedure: ESOPHAGOGASTRODUODENOSCOPY (EGD) WITH PROPOFOL;  Surgeon: [name], MD;  Location: [facility] ENDOSCOPY;  Service: Gastroenterology;  Laterality: N/A;   [name] CAPSULE STUDY Left 02/10/2020   Procedure: [name] CAPSULE STUDY;  Surgeon: Arta Silence, MD;  Location: [facility] ENDOSCOPY;  Service: Endoscopy;  Laterality: Left;   HEMOSTASIS CLIP PLACEMENT  02/11/2019   Procedure: HEMOSTASIS CLIP PLACEMENT;  Surgeon: [name], MD;  Location: [place];  Service: Gastroenterology;;   HEMOSTASIS CONTROL  02/11/2019   Procedure: HEMOSTASIS CONTROL;  Surgeon: [name], MD;  Location: [place];  Service: Gastroenterology;;   HOT HEMOSTASIS N/A 02/11/2019   Procedure: HOT HEMOSTASIS (ARGON PLASMA COAGULATION/BICAP);  Surgeon: [name], MD;  Location: [facility] ENDOSCOPY;  Service: Gastroenterology;  Laterality: N/A;   HOT HEMOSTASIS N/A 08/04/2019   Procedure: HOT HEMOSTASIS (ARGON PLASMA COAGULATION/BICAP);  Surgeon: [name], MD;  Location: [facility] ENDOSCOPY;  Service: Gastroenterology;  Laterality: N/A;   LOOP RECORDER INSERTION N/A 12/07/2017   Procedure: LOOP RECORDER INSERTION;  Surgeon: Constance Haw, MD;  Location: [facility] CV LAB;  Service: Cardiovascular;  Laterality: N/A;   NO PAST SURGERIES     TEE WITHOUT CARDIOVERSION N/A 12/07/2017   Procedure: TRANSESOPHAGEAL ECHOCARDIOGRAM (TEE);  Surgeon:  Dorothy Spark, MD;  Location: [facility] ENDOSCOPY;  Service: Cardiovascular;  Laterality: N/A;   Social History:  reports that he has been smoking cigarettes. He has a 12.00 pack-year smoking history. He has never used smokeless tobacco. He reports that he does not drink alcohol and does not use drugs.  No Known Allergies  History reviewed. No pertinent family history the patient's presenting complaint and patient cannot provide any subjective history.  Prior to Admission medications   Medication Sig Start Date End Date Taking? Authorizing Provider  alum & mag hydroxide-simeth (MAALOX/MYLANTA) 200-200-20 MG/5ML suspension Take 30 mLs by mouth every 6 (six) hours as needed for indigestion or heartburn.   Yes [provider]  amLODipine (NORVASC) 5 MG tablet Take 1 tablet (5 mg total) by mouth daily. Patient taking differently: Take 5 mg by mouth every morning. 10/26/20  Yes Rai, Ripudeep K, MD  atorvastatin (LIPITOR) 40 MG tablet Take 40 mg by mouth every morning. 01/11/20  Yes [provider]  clopidogrel (PLAVIX) 75 MG tablet Take 1 tablet (75 mg total) by mouth every morning. 10/30/20  Yes Rai, Ripudeep K, MD  ferrous sulfate 325 (65 FE) MG tablet Take 1 tablet (325 mg total) by mouth 2 (two) times daily with a meal. Patient taking differently: Take 325 mg by mouth every Monday, Wednesday, and Friday. 10/26/20  Yes Rai, Ripudeep K, MD  finasteride (PROSCAR) 5 MG tablet Take 5 mg by mouth every morning.   Yes [provider]  guaiFENesin (ROBITUSSIN) 100 MG/5ML SOLN Take 10 mLs by mouth every 6 (six) hours as needed for cough.   Yes [provider]  loperamide (IMODIUM) 2 MG capsule Take 2 mg by mouth as needed for diarrhea or loose stools (not to exceed 8 doses in 24hrs).   Yes [provider]  magnesium hydroxide (MILK OF MAGNESIA) 400 MG/5ML suspension Take 30 mLs by mouth at bedtime as needed (constipation).   Yes [provider]   neomycin-bacitracin-polymyxin [phone] OINT Apply 1 application. topically daily as needed (minor skin tears or abrasians).   Yes [provider]  pantoprazole (PROTONIX) 40 MG tablet Take 40 mg by mouth daily at 6 (six) AM.   Yes [provider]    Physical Exam: Vitals:   12/24/21 1130 12/24/21 1240 12/24/21 1415 12/24/21 1633  BP: 124/88 122/80 140/87 (!) 130/93  Pulse: 77 61 (!) 55 67  Resp:  (!) '21 20 20  '$ Temp:    97.6 F (36.4 C)  TempSrc:      SpO2: 100% 100% 100% 100%   Vitals:   12/24/21 1415 12/24/21 1633  BP: 140/87 (!) 130/93  Pulse: (!) 55 67  Resp: 20 20  Temp:  97.6 F (36.4 C)  SpO2: 100% 100%   Examination: Physical Exam:  Constitutional: Thin chronically ill-appearing African-American male currently no acute distress appears calm but very confused Respiratory: Diminished to auscultation bilaterally, no wheezing, rales, rhonchi or crackles. Normal respiratory effort and patient is not tachypenic. No accessory muscle use.  Unlabored breathing Cardiovascular: RRR, no murmurs / rubs / gallops. S1 and S2 auscultated.   Abdomen: Soft, non-tender, non-distended. Bowel sounds positive.  GU: Deferred. Has a condom cath on Skin: No rashes, lesions, ulcers on limited skin evaluation. No  induration; Warm and dry.  Neurologic: CN 2-12 grossly intact with no focal deficits but is wearing safety mitts and does not really follow neurological examination Psychiatric: Impaired judgment and insight and he is pleasantly confused and demented  Data Reviewed:  Data was reviewed and he has a creatinine of 1.33, anemia panel showed an iron level of 25, saturation of 6%, ferritin level 5, hemoglobin/hematocrit of 7.8/27.2 with a MCV 76.0  Assessment and Plan:  Acute on chronic blood loss/iron deficiency anemia in the setting of history of GI bleeding and known AVMs with possible concern for GI bleed -Presented with a hemoglobin of 7.8 down from 10.1 back in  April of last year -Has had an extensive work-up and is on Plavix which we will hold -He had an EGD previously and GI has been consulted and there is no plans for procedures or invasive testing at this time given that he is no significantly overt blood -Patient getting typed and screened and transfused 1 unit of PRBCs and will also consider some iron therapy will defer this to GI -GI recommending a CT scan of the abdomen pelvis which we will order -FOBT was positive -Anemia panel done and showed an iron level of 25, TIBC 398, TIBC 423, saturation ratios of 6%, ferritin level 5, folate of 15.8, vitamin B12 245 -We will continue monitor for signs and symptoms bleeding repeat CBC in a.m.  History of CVA and now with vascular dementia -Hold his Plavix but continue statin  Hyperlipidemia -Continue statin  GERD/GI prophylaxis -Place the patient on pantoprazole 40 p.o. daily for the GI as they will make this IV  BPH -Continue with finasteride 5 mg p.o. every morning  CKD stage IIIa -Patient's BUNs/creatinine is now 19/1.33; will give him a unit of blood and also start him on Unasyn at 75 mils per hour -Continue to monitor and avoid nephrotoxic medications, contrast dyes, hypotension and dehydration to ensure adequate renal perfusion and renally adjust medications -Repeat CMP in a.m.   Advance Care Planning:   Code Status: Full Code family currently at bedside to verify his CODE STATUS and patient unable to provide this for me will need to be verified in the morning  Consults: Gastroenterology  Family Communication: No family currently at bedside I called the patient's brother twice and he did not pick up the phone  Severity of Illness: The appropriate patient status for this patient is OBSERVATION. Observation status is judged to be reasonable and necessary in order to provide the required intensity of service to ensure the patient's safety. The patient's presenting symptoms, physical exam  findings, and initial radiographic and laboratory data in the context of their medical condition is felt to place them at decreased risk for further clinical deterioration. Furthermore, it is anticipated that the patient will be medically stable for discharge from the hospital within 2 midnights of admission.   Author: Raiford Noble, DO 12/24/2021 6:43 PM  For on call review [email or web address].

## 2021-12-24 NOTE — ED Triage Notes (Addendum)
Pt presents with c/o low hemoglobin. Pt is coming from [facility], denies any pain.

## 2021-12-25 DIAGNOSIS — K921 Melena: Secondary | ICD-10-CM | POA: Diagnosis not present

## 2021-12-25 DIAGNOSIS — K5521 Angiodysplasia of colon with hemorrhage: Secondary | ICD-10-CM | POA: Diagnosis not present

## 2021-12-25 DIAGNOSIS — D62 Acute posthemorrhagic anemia: Secondary | ICD-10-CM | POA: Diagnosis not present

## 2021-12-25 DIAGNOSIS — D649 Anemia, unspecified: Secondary | ICD-10-CM | POA: Diagnosis not present

## 2021-12-25 DIAGNOSIS — E7800 Pure hypercholesterolemia, unspecified: Secondary | ICD-10-CM

## 2021-12-25 LAB — COMPREHENSIVE METABOLIC PANEL
ALT: 16 U/L (ref 0–44)
AST: 22 U/L (ref 15–41)
Albumin: 3.4 g/dL — ABNORMAL LOW (ref 3.5–5.0)
Alkaline Phosphatase: 47 U/L (ref 38–126)
Anion gap: 7 (ref 5–15)
BUN: 14 mg/dL (ref 8–23)
CO2: 25 mmol/L (ref 22–32)
Calcium: 9.6 mg/dL (ref 8.9–10.3)
Chloride: 110 mmol/L (ref 98–111)
Creatinine, Ser: 1.15 mg/dL (ref 0.61–1.24)
GFR, Estimated: >60 mL/min (ref >60)
Glucose, Bld: 79 mg/dL (ref 70–99)
Potassium: 4 mmol/L (ref 3.5–5.1)
Sodium: 142 mmol/L (ref 135–145)
Total Bilirubin: 0.4 mg/dL (ref 0.3–1.2)
Total Protein: 6.9 g/dL (ref 6.5–8.1)

## 2021-12-25 LAB — TYPE AND SCREEN
ABO/RH(D): O NEG
Antibody Screen: POSITIVE
Donor AG Type: NEGATIVE
Unit division: 0

## 2021-12-25 LAB — BPAM RBC
Blood Product Expiration Date: 202307062359
ISSUE DATE / TIME: 202306062119
Unit Type and Rh: 9500

## 2021-12-25 LAB — CBC
HCT: 27.8 % — ABNORMAL LOW (ref 39.0–52.0)
Hemoglobin: 8.3 g/dL — ABNORMAL LOW (ref 13.0–17.0)
MCH: 22.8 pg — ABNORMAL LOW (ref 26.0–34.0)
MCHC: 29.9 g/dL — ABNORMAL LOW (ref 30.0–36.0)
MCV: 76.4 fL — ABNORMAL LOW (ref 80.0–100.0)
Platelets: 292 10*3/uL (ref 150–400)
RBC: 3.64 MIL/uL — ABNORMAL LOW (ref 4.22–5.81)
RDW: 20.9 % — ABNORMAL HIGH (ref 11.5–15.5)
WBC: 5.2 10*3/uL (ref 4.0–10.5)
nRBC: 0 % (ref 0.0–0.2)

## 2021-12-25 LAB — HIV ANTIBODY (ROUTINE TESTING W REFLEX): HIV Screen 4th Generation wRfx: NONREACTIVE

## 2021-12-25 LAB — GLUCOSE, CAPILLARY: Glucose-Capillary: 78 mg/dL (ref 70–99)

## 2021-12-25 LAB — TSH: TSH: 1.778 u[IU]/mL (ref 0.350–4.500)

## 2021-12-25 MED ORDER — HALOPERIDOL LACTATE 5 MG/ML IJ SOLN
1.0000 mg | Freq: Four times a day (QID) | INTRAMUSCULAR | Status: DC | PRN
  Administered 2021-12-25 – 2021-12-26 (×2): 1 mg via INTRAVENOUS
  Filled 2021-12-25 (×2): qty 1

## 2021-12-25 MED ORDER — SODIUM CHLORIDE 0.9 % IV SOLN
250.0000 mg | Freq: Once | INTRAVENOUS | Status: AC
  Administered 2021-12-25: 250 mg via INTRAVENOUS
  Filled 2021-12-25: qty 5

## 2021-12-25 MED ORDER — ASCORBIC ACID 500 MG PO TABS
500.0000 mg | ORAL_TABLET | Freq: Every day | ORAL | Status: DC
  Administered 2021-12-25 – 2021-12-26 (×2): 500 mg via ORAL
  Filled 2021-12-25 (×2): qty 1

## 2021-12-25 MED ORDER — SODIUM CHLORIDE 0.9 % IV SOLN
25.0000 mg | Freq: Once | INTRAVENOUS | Status: AC
  Administered 2021-12-25: 25 mg via INTRAVENOUS
  Filled 2021-12-25: qty 0.5

## 2021-12-25 NOTE — Plan of Care (Signed)

## 2021-12-25 NOTE — TOC Initial Note (Addendum)
Transition of Care [facility]) - Initial/Assessment Note    Patient Details  Name: Karl Miller MRN: [id number] Date of Birth: 10/21/1955  Transition of Care [facility] CM/SW Contact:    Vassie Moselle, LCSW Phone Number: 12/25/2021, 11:18 AM  Clinical Narrative:                 Met with pt and his brother/legal guardian, [name] who confirmed pt has been living at [facility] for the past 3 years and plans to return to their facility at discharge. CSW received some paperwork from brother regarding his legal guardian status which has been placed in pt's chart. No DME needs identified at this time. TOC will continue to follow for discharge planning needs.   1255: CSW left voicemail with Stephan Minister [phone] ext: [phone]) to confirm pt is able to return to their facility at discharge.    Expected Discharge Plan: Memory Care Barriers to Discharge: No Barriers Identified   Patient Goals and CMS Choice Patient states their goals for this hospitalization and ongoing recovery are:: Return to [facility]      Expected Discharge Plan and Services Expected Discharge Plan: Memory Care In-house Referral: Clinical Social Work Discharge Planning Services: CM Consult Post Acute Care Choice: Resumption of Svcs/PTA Provider Living arrangements for the past 2 months: [place] (Memory Care)                 DME Arranged: N/A                    Prior Living Arrangements/Services Living arrangements for the past 2 months: [place] (Memory Care) Lives with:: Facility Resident Patient language and need for interpreter reviewed:: Yes Do you feel safe going back to the place where you live?: Yes      Need for Family Participation in Patient Care: Yes (Comment) (Pt has legal guardian) Care giver support system in place?: Yes (comment) Current home services: DME Criminal Activity/Legal Involvement Pertinent to Current Situation/Hospitalization: No - Comment  as needed  Activities of Daily Living Home Assistive Devices/Equipment: Environmental consultant (specify type), Wheelchair, Eyeglasses (eyeglasses disappeared a year or 2 ago, working on getting dentures) ADL Screening (condition at time of admission) Patient's cognitive ability adequate to safely complete daily activities?: No Is the patient deaf or have difficulty hearing?: No Does the patient have difficulty seeing, even when wearing glasses/contacts?: No Does the patient have difficulty concentrating, remembering, or making decisions?: Yes (at times) Patient able to express need for assistance with ADLs?: No (he might at times) Does the patient have difficulty dressing or bathing?: Yes Independently performs ADLs?: No Communication: Independent Dressing (OT): Needs assistance Is this a change from baseline?: Pre-admission baseline Grooming: Needs assistance Is this a change from baseline?: Pre-admission baseline Feeding: Needs assistance Is this a change from baseline?: Pre-admission baseline Bathing: Needs assistance Is this a change from baseline?: Pre-admission baseline Toileting: Needs assistance Is this a change from baseline?: Pre-admission baseline In/Out Bed: Needs assistance Is this a change from baseline?: Pre-admission baseline Walks in Home: Needs assistance Is this a change from baseline?: Pre-admission baseline (uses walker) Does the patient have difficulty walking or climbing stairs?: Yes Weakness of Legs: Both Weakness of Arms/Hands: Both  Permission Sought/Granted Permission sought to share information with : Facility Sport and exercise psychologist, Guardian Permission granted to share information with : Yes, Verbal Permission Granted  Share Information with NAME: Renzo Vincelette  Permission granted to share info w AGENCY: [place] granted  to share info w Relationship: Brother/Legal Guardian  Permission granted to share info w Contact Information:  [phone]  Emotional Assessment Appearance:: Appears stated age Attitude/Demeanor/Rapport: Engaged Affect (typically observed): Pleasant, Calm Orientation: : Oriented to Self Alcohol / Substance Use: Not Applicable Psych Involvement: No (comment)  Admission diagnosis:  GIB (gastrointestinal bleeding) [K92.2] Patient Active Problem List   Diagnosis Date Noted   GIB (gastrointestinal bleeding) 10/25/2020   Symptomatic anemia 02/06/2020   Angiodysplasia of intestine with hemorrhage    Acute on chronic blood loss anemia 07/30/2019   Gastric and duodenal angiodysplasia with hemorrhage    Heme positive stool    Anemia    Vascular dementia without behavioral disturbance (HCC)    Advanced care planning/counseling discussion    Goals of care, counseling/discussion    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[place])    Palliative care by specialist    GI bleed 02/08/2019   CVA (cerebral vascular accident) ([place]) 11/15/2018   Benign essential HTN    Tobacco abuse    TIA (transient ischemic attack)    Diastolic dysfunction    Prediabetes    Acute blood loss anemia    Hyperlipidemia    Syphilis    Cerebral embolism with cerebral infarction 12/05/2017   Smoker    Acute encephalopathy 12/04/2017   HTN (hypertension) 12/04/2017   AKI (acute kidney injury) ([place]) 12/04/2017   Elevated PSA 12/04/2017   Rhabdomyolysis 12/04/2017   PCP:  [facility] Pharmacy:  No Pharmacies Listed    Social Determinants of Health (SDOH) Interventions    Readmission Risk Interventions     View : No data to display.

## 2021-12-25 NOTE — Progress Notes (Signed)
Karl Miller 10:40 AM  Subjective: Patient seen and examined and discussed with our PA and he is in good spirits without any complaints and he has a long history of AVM bleeding with multiple work-ups in the past and his CT was pertinent for some constipation but no worrisome findings  Objective: Vital signs stable afebrile no acute distress abdomen is soft nontender chemistries okay BUN and creatinine improved hemoglobin increased with transfusion  Assessment: GI blood loss and iron deficiency secondary to Plavix and AVMs  Plan: Would reevaluate blood thinner requirements possibly he could be maintained on an aspirin a day and a pump inhibitor every day and I would give him IV iron before discharge and set up an outpatient hematology appointment in 1 month to decide on the timing of periodic IV iron and would also add once a day MiraLAX for his CT findings of constipation and that can be adjusted at his nursing home as needed and possibly if the hematologist replenishes his iron he could possibly stop his oral iron which should help his constipation and please call us if we can be of any further assistance with this hospital stay  [facility] E  office [phone] After 5PM or if no answer call [phone]

## 2021-12-25 NOTE — Progress Notes (Signed)
PROGRESS NOTE    Karl Miller  JJO:[id number] DOB: 1956-04-28 DOA: 12/24/2021 [facility]     Brief Narrative:  66 y.o. [name] CVA, HTN, HLD, GI bleed, Dementia, acute Encephalopathy   Presented to the hospital after he was found to have a low hemoglobin on labs from this facility.  Of note the patient is unable to provide a subjective history given his dementia and current condition and because he is a poor historian but he currently lives at [facility] was sent from them given concern for his low hemoglobin of down to 7.8.  He has a history of recurrent GI bleeding and was admitted last April 2022 at that time GI was consulted and recommended supportive care with iron supplementation and had extensive work-up 2021 for GI bleeding and known AVMs.  In the ED was typed and screened and transfused 1 unit of PRBCs and GI was consulted.  Patient is unable to provide any meaningful subjective history.  Patient's brother denies any GI malignancy at patient continues takes Plavix given his history of CVA.  Unsure if the patient's bowel movement was dark or black.  Patient's current diet at the nursing facility is pured.  [facility] was asked admit this patient for low hemoglobin and concern for GI bleeding.   Subjective: A/O x1 (does not know where, when, why).  Pleasantly confused.  Per his brother resides at [facility] memory care.   Assessment & Plan: Covid vaccination;   Principal Problem:   GIB (gastrointestinal bleeding) Active Problems:   Hyperlipidemia   Acute blood loss anemia   CVA (cerebral vascular accident) ([place])   Acute on chronic blood loss anemia   Angiodysplasia of intestine with hemorrhage   Symptomatic anemia  Acute on chronic blood loss/iron deficiency anemia/ known AVMs  -Presented with a hemoglobin of 7.8 down from 10.1 back in April of last year -Has had an extensive work-up and is on Plavix which we will hold -He had an EGD previously and GI  has been consulted and there is no plans for procedures or invasive testing at this time given that he is no significantly overt blood loss -Patient getting typed and screened and transfused 1 unit of PRBCs and will also consider some iron therapy will defer this to GI -GI recommending a CT scan of the abdomen pelvis which we will order -FOBT was positive -Anemia panel done.  Consistent with severe iron deficiency anemia  Lab Results  Component Value Date   HGB 8.3 (L) 12/25/2021   HGB 7.8 (L) 12/24/2021   HGB 10.1 (L) 11/11/2020   HGB 9.2 (L) 10/26/2020   HGB 8.9 (L) 10/25/2020  -6/7 iron IV 250 mg + vitamin C 500 mg - 6/7 starting in the a.m. p.o. iron + po vitamin C   History of CVA and now with vascular dementia -Hold his Plavix but continue statin   Hyperlipidemia -Continue statin   GERD/GI prophylaxis -Place the patient on pantoprazole 40 p.o. daily for the GI as they will make this IV   BPH -Continue with finasteride 5 mg p.o. every morning   CKD stage IIIa -Patient's BUNs/creatinine is now 19/1.33; will give him a unit of blood  -Continue to monitor and avoid nephrotoxic medications, contrast dyes, hypotension and dehydration to ensure adequate renal perfusion and renally adjust medications -Repeat CMP in a.m.     Mobility Assessment (last 72 hours)     Mobility Assessment     Row Name 12/24/21 1700  Does patient have an order for bedrest or is patient medically unstable No - Continue assessment       What is the highest level of mobility based on the progressive mobility assessment? Level 1 (Bedfast) - Unable to balance while sitting on edge of bed       Is the above level different from baseline mobility prior to current illness? Yes - Recommend PT order                       DVT prophylaxis: SCD Code Status:  Family Communication: 6/7 brother at bedside for discussion plan of care all questions answered Status is: Inpatient    Dispo:  The patient is from: [facility] memory care.              Anticipated d/c is to: [facility] memory care.              Anticipated d/c date is: 2 days              Patient currently is not medically stable to d/c.      Consultants:    Procedures/Significant Events:  6/6 CT abdomen pelvis Wo contrast Changes of colonic constipation and findings suggestive of mild stercoral colitis in the rectum.   Punctate nonobstructing renal calculi bilaterally.   Diverticulosis without diverticulitis.   Changes consistent with underlying anemia.  I have personally reviewed and interpreted all radiology studies and my findings are as above.  VENTILATOR SETTINGS:    Cultures   Antimicrobials: Anti-infectives (From admission, onward)    None         Devices    LINES / TUBES:      Continuous Infusions:  sodium chloride 75 mL/hr at 12/25/21 1019     Objective: Vitals:   12/24/21 2138 12/25/21 0018 12/25/21 0049 12/25/21 0514  BP: 127/85 134/87 124/72 (!) 149/89  Pulse: 68 71 66 64  Resp: '20 15 18 18  '$ Temp: 98.5 F (36.9 C) 98.4 F (36.9 C) 98.4 F (36.9 C) (!) 97.5 F (36.4 C)  TempSrc: Oral Oral Oral Axillary  SpO2: 100% 100% 100% 95%  Weight:    67 kg    Intake/Output Summary (Last 24 hours) at 12/25/2021 1036 Last data filed at 12/25/2021 0300 Gross per 24 hour  Intake 517.75 ml  Output --  Net 517.75 ml   Filed Weights   12/25/21 0514  Weight: 67 kg    Examination:  General: A/O x1 (does not know where, when, why) pleasantly confused follows all commands No acute respiratory distress, cachectic Eyes: negative scleral hemorrhage, negative anisocoria, negative icterus ENT: Negative Runny nose, negative gingival bleeding, Neck:  Negative scars, masses, torticollis, lymphadenopathy, JVD Lungs: Clear to auscultation bilaterally without wheezes or crackles Cardiovascular: Regular rate and rhythm without murmur gallop or rub normal S1 and  S2 Abdomen: negative abdominal pain, nondistended, positive soft, bowel sounds, no rebound, no ascites, no appreciable mass Extremities: No significant cyanosis, clubbing, or edema bilateral lower extremities Skin: Negative rashes, lesions, ulcers Psychiatric:  Negative depression, negative anxiety, negative fatigue, negative mania  Central nervous system:  Cranial nerves II through XII intact, tongue/uvula midline, all extremities muscle strength 5/5, sensation intact throughout, negative dysarthria, negative expressive aphasia, negative receptive aphasia.  .     Data Reviewed: Care during the described time interval was provided by me .  I have reviewed this patient's available data, including medical history, events of note, physical examination, and all  test results as part of my evaluation.  CBC: Recent Labs  Lab 12/24/21 1243 12/25/21 0509  WBC 4.3 5.2  NEUTROABS 2.7  --   HGB 7.8* 8.3*  HCT 27.2* 27.8*  MCV 76.0* 76.4*  PLT 336 893   Basic Metabolic Panel: Recent Labs  Lab 12/24/21 1243 12/25/21 0509  NA 141 142  K 4.2 4.0  CL 109 110  CO2 26 25  GLUCOSE 94 79  BUN 19 14  CREATININE 1.33* 1.15  CALCIUM 10.1 9.6   GFR: CrCl cannot be calculated (Unknown ideal weight.). Liver Function Tests: Recent Labs  Lab 12/24/21 1243 12/25/21 0509  AST 19 22  ALT 17 16  ALKPHOS 51 47  BILITOT 0.4 0.4  PROT 7.4 6.9  ALBUMIN 3.7 3.4*   No results for input(s): LIPASE, AMYLASE in the last 168 hours. No results for input(s): AMMONIA in the last 168 hours. Coagulation Profile: Recent Labs  Lab 12/24/21 1243  INR 1.0   Cardiac Enzymes: No results for input(s): CKTOTAL, CKMB, CKMBINDEX, TROPONINI in the last 168 hours. BNP (last 3 results) No results for input(s): PROBNP in the last 8760 hours. HbA1C: No results for input(s): HGBA1C in the last 72 hours. CBG: Recent Labs  Lab 12/24/21 1247 12/25/21 0747  GLUCAP 71 78   Lipid Profile: No results for  input(s): CHOL, HDL, LDLCALC, TRIG, CHOLHDL, LDLDIRECT in the last 72 hours. Thyroid Function Tests: Recent Labs    12/25/21 0509  TSH 1.778   Anemia Panel: Recent Labs    12/24/21 1242 12/24/21 1243  VITAMINB12 245  --   FOLATE  --  15.8  FERRITIN 5*  --   TIBC 423  --   IRON 25*  --   RETICCTPCT  --  2.0   Sepsis Labs: No results for input(s): PROCALCITON, LATICACIDVEN in the last 168 hours.  No results found for this or any previous visit (from the past 240 hour(s)).       Radiology Studies: CT ABDOMEN PELVIS WO CONTRAST  Result Date: 12/24/2021 CLINICAL DATA:  Abdominal pain and GI bleeding, initial encounter EXAM: CT ABDOMEN AND PELVIS WITHOUT CONTRAST TECHNIQUE: Multidetector CT imaging of the abdomen and pelvis was performed following the standard protocol without IV contrast. RADIATION DOSE REDUCTION: This exam was performed according to the departmental dose-optimization program which includes automated exposure control, adjustment of the mA and/or kV according to patient size and/or use of iterative reconstruction technique. COMPARISON:  None Available. FINDINGS: Lower chest: Mild dependent atelectatic changes are noted. Decreased attenuation is noted in the cardiac blood pool suggestive of underlying anemia. Hepatobiliary: No focal liver abnormality is seen. No gallstones, gallbladder wall thickening, or biliary dilatation. Pancreas: Unremarkable. No pancreatic ductal dilatation or surrounding inflammatory changes. Spleen: Normal in size without focal abnormality. Adrenals/Urinary Tract: Adrenal glands are within normal limits. Kidneys demonstrate no obstructive changes. Punctate nonobstructing right renal stone is seen in the upper pole. Similar findings are noted within the left lower pole. Ureters are within normal limits. The bladder is well distended. Stomach/Bowel: Considerable fecal material is noted within the rectal vault with mild wall thickening consistent with a  degree of constipation and possible early stercoral colitis. Fecal material is noted throughout the colon. Diverticular changes noted without evidence of diverticulitis. The appendix is within normal limits. Small bowel and stomach are unremarkable. Vascular/Lymphatic: Aortic atherosclerosis. No enlarged abdominal or pelvic lymph nodes. Reproductive: Prostate is unremarkable. Other: No abdominal wall hernia or abnormality. No abdominopelvic ascites. Musculoskeletal: No acute  or significant osseous findings. IMPRESSION: Changes of colonic constipation and findings suggestive of mild stercoral colitis in the rectum. Punctate nonobstructing renal calculi bilaterally. Diverticulosis without diverticulitis. Changes consistent with underlying anemia. Electronically Signed   By: Inez Catalina M.D.   On: 12/24/2021 20:53        Scheduled Meds:  atorvastatin  40 mg Oral q morning   finasteride  5 mg Oral q morning   pantoprazole  40 mg Oral Daily   Continuous Infusions:  sodium chloride 75 mL/hr at 12/25/21 1019     LOS: 0 days    Time spent:40 min    Lincon Sahlin, Geraldo Docker, MD [facility]   If 7PM-7AM, please contact night-coverage 12/25/2021, 10:36 AM

## 2021-12-26 DIAGNOSIS — D62 Acute posthemorrhagic anemia: Secondary | ICD-10-CM | POA: Diagnosis not present

## 2021-12-26 DIAGNOSIS — K922 Gastrointestinal hemorrhage, unspecified: Secondary | ICD-10-CM | POA: Diagnosis not present

## 2021-12-26 DIAGNOSIS — D649 Anemia, unspecified: Secondary | ICD-10-CM | POA: Diagnosis not present

## 2021-12-26 DIAGNOSIS — K5521 Angiodysplasia of colon with hemorrhage: Secondary | ICD-10-CM | POA: Diagnosis not present

## 2021-12-26 LAB — CBC WITH DIFFERENTIAL/PLATELET
Abs Immature Granulocytes: 0.01 10*3/uL (ref 0.00–0.07)
Basophils Absolute: 0 10*3/uL (ref 0.0–0.1)
Basophils Relative: 1 %
Eosinophils Absolute: 0.3 10*3/uL (ref 0.0–0.5)
Eosinophils Relative: 5 %
HCT: 31.5 % — ABNORMAL LOW (ref 39.0–52.0)
Hemoglobin: 9.3 g/dL — ABNORMAL LOW (ref 13.0–17.0)
Immature Granulocytes: 0 %
Lymphocytes Relative: 10 %
Lymphs Abs: 0.5 10*3/uL — ABNORMAL LOW (ref 0.7–4.0)
MCH: 22.7 pg — ABNORMAL LOW (ref 26.0–34.0)
MCHC: 29.5 g/dL — ABNORMAL LOW (ref 30.0–36.0)
MCV: 77 fL — ABNORMAL LOW (ref 80.0–100.0)
Monocytes Absolute: 0.7 10*3/uL (ref 0.1–1.0)
Monocytes Relative: 13 %
Neutro Abs: 3.7 10*3/uL (ref 1.7–7.7)
Neutrophils Relative %: 71 %
Platelets: 316 10*3/uL (ref 150–400)
RBC: 4.09 MIL/uL — ABNORMAL LOW (ref 4.22–5.81)
RDW: 21.4 % — ABNORMAL HIGH (ref 11.5–15.5)
WBC: 5.2 10*3/uL (ref 4.0–10.5)
nRBC: 0 % (ref 0.0–0.2)

## 2021-12-26 LAB — PHOSPHORUS: Phosphorus: 2.8 mg/dL (ref 2.5–4.6)

## 2021-12-26 LAB — COMPREHENSIVE METABOLIC PANEL
ALT: 15 U/L (ref 0–44)
AST: 20 U/L (ref 15–41)
Albumin: 3.7 g/dL (ref 3.5–5.0)
Alkaline Phosphatase: 53 U/L (ref 38–126)
Anion gap: 7 (ref 5–15)
BUN: 11 mg/dL (ref 8–23)
CO2: 25 mmol/L (ref 22–32)
Calcium: 9.7 mg/dL (ref 8.9–10.3)
Chloride: 110 mmol/L (ref 98–111)
Creatinine, Ser: 1.12 mg/dL (ref 0.61–1.24)
GFR, Estimated: >60 mL/min (ref >60)
Glucose, Bld: 78 mg/dL (ref 70–99)
Potassium: 3.5 mmol/L (ref 3.5–5.1)
Sodium: 142 mmol/L (ref 135–145)
Total Bilirubin: 0.7 mg/dL (ref 0.3–1.2)
Total Protein: 7.4 g/dL (ref 6.5–8.1)

## 2021-12-26 LAB — GLUCOSE, CAPILLARY
Glucose-Capillary: 65 mg/dL — ABNORMAL LOW (ref 70–99)
Glucose-Capillary: 66 mg/dL — ABNORMAL LOW (ref 70–99)
Glucose-Capillary: 80 mg/dL (ref 70–99)

## 2021-12-26 LAB — MAGNESIUM: Magnesium: 2.1 mg/dL (ref 1.7–2.4)

## 2021-12-26 MED ORDER — ASCORBIC ACID 500 MG PO TABS: 500.0000 mg | ORAL_TABLET | Freq: Every day | ORAL | 0 refills | Status: AC

## 2021-12-26 MED ORDER — FERROUS SULFATE 325 (65 FE) MG PO TABS
325.0000 mg | ORAL_TABLET | Freq: Two times a day (BID) | ORAL | Status: DC
  Administered 2021-12-26: 325 mg via ORAL
  Filled 2021-12-26: qty 1

## 2021-12-26 MED ORDER — OXYCODONE HCL 5 MG PO TABS: 5.0000 mg | ORAL_TABLET | ORAL | 0 refills | Status: AC | PRN

## 2021-12-26 MED ORDER — ONDANSETRON HCL 4 MG PO TABS: 4.0000 mg | ORAL_TABLET | Freq: Four times a day (QID) | ORAL | 0 refills | Status: AC | PRN

## 2021-12-26 NOTE — Progress Notes (Signed)
[place] and spoke w/ Teacher, music (care Warehouse manager) and gave report. Crystal verbalized understanding and had no questions.

## 2021-12-26 NOTE — TOC Transition Note (Addendum)
Transition of Care [facility]) - CM/SW Discharge Note   Patient Details  Name: Aster Eckrich MRN: [id number] Date of Birth: 1955/10/03  [facility]) CM/SW Contact:  Vassie Moselle, LCSW Phone Number: 12/26/2021, 8:21 AM   Clinical Narrative:    Pt is to return to [facility] for [place]. Pt's discharge paperwork faxed to their facility at [phone]. No DME needs identified. Pt and brother agreeable to pt returning to [facility] room 315. Call to report to [phone] option 1.  [facility] will be called for transportation.    Final next level of care: Memory Care Barriers to Discharge: No Barriers Identified   Patient Goals and CMS Choice Patient states their goals for this hospitalization and ongoing recovery are:: Return to [facility]      Discharge Placement                Patient to be transferred to facility by: [place] Name of family member notified: Cindie Laroche Patient and family notified of of transfer: 12/26/21  Discharge Plan and Services In-house Referral: Clinical Social Work Discharge Planning Services: CM Consult Post Acute Care Choice: Resumption of Svcs/PTA Provider          DME Arranged: N/A                    Social Determinants of Health (SDOH) Interventions     Readmission Risk Interventions     No data to display

## 2021-12-26 NOTE — Plan of Care (Signed)
  Problem: Education: Goal: Knowledge of General Education information will improve Description Including pain rating scale, medication(s)/side effects and non-pharmacologic comfort measures Outcome: Adequate for Discharge   Problem: Health Behavior/Discharge Planning: Goal: Ability to manage health-related needs will improve Outcome: Adequate for Discharge   

## 2021-12-26 NOTE — Discharge Summary (Signed)
Physician Discharge Summary  Karl Miller EXB:[id number] DOB: [birth year] DOA: 12/24/2021  [facility]  Admit date: 12/24/2021 Discharge date: 12/26/2021  Time spent: 35 minutes  Recommendations for Outpatient Follow-up:   Acute on chronic blood loss/iron deficiency anemia/ known AVMs  -Presented with a hemoglobin of 7.8 down from 10.1 back in April of last year -Has had an extensive work-up and is on Plavix which we will hold -He had an EGD previously and GI has been consulted and there is no plans for procedures or invasive testing at this time given that he is no significantly overt blood loss -Patient getting typed and screened and transfused 1 unit of PRBCs and will also consider some iron therapy will defer this to GI -GI recommending a CT scan of the abdomen pelvis which we will order -FOBT was positive -Anemia panel done.  Consistent with severe iron deficiency anemia Lab Results  Component Value Date   HGB 9.3 (L) 12/26/2021   HGB 8.3 (L) 12/25/2021   HGB 7.8 (L) 12/24/2021   HGB 10.1 (L) 11/11/2020   HGB 9.2 (L) 10/26/2020  -6/7 iron IV 250 mg + vitamin C 500 mg - 6/8 iron p.o. 325 mg BID on M/W/F + vitamin C 500 mg daily   History of CVA and now with vascular dementia -Hold his Plavix but continue statin   Hyperlipidemia -Continue statin   GERD/GI prophylaxis -Place the patient on pantoprazole 40 p.o. daily    BPH -Finasteride 5 mg p.o. every morning   CKD stage IIIa -Patient's BUNs/creatinine is now 19/1.33; will give him a unit of blood  -Continue to monitor and avoid nephrotoxic medications, contrast dyes, hypotension and dehydration to ensure adequate renal perfusion and renally adjust medications      Discharge Diagnoses:  Principal Problem:   GIB (gastrointestinal bleeding) Active Problems:   Hyperlipidemia   Acute blood loss anemia   CVA (cerebral vascular accident) ([place])   Acute on chronic blood loss anemia   Angiodysplasia of  intestine with hemorrhage   Symptomatic anemia   Discharge Condition: Stable  Diet recommendation: Heart healthy  Filed Weights   12/25/21 0514 12/26/21 0411  Weight: 67 kg 67.6 kg    History of present illness:  66 y.o. [name] CVA, HTN, HLD, GI bleed, Dementia, acute Encephalopathy    Presented to the hospital from [name], after he was found to have a low hemoglobin on labs from this facility.  Of note the patient is unable to provide a subjective history given his dementia and current condition and because he is a poor historian but he currently lives at [name] given concern for his low hemoglobin of down to 7.8.  He has a history of recurrent GI bleeding and was admitted last April 2022 at that time GI was consulted and recommended supportive care with iron supplementation and had extensive work-up 2021 for GI bleeding and known AVMs.  In the ED was typed and screened and transfused 1 unit of PRBCs and GI was consulted.  Patient is unable to provide any meaningful subjective history.  Patient's brother denies any GI malignancy at patient continues takes Plavix given his history of CVA.  Unsure if the patient's bowel movement was dark or black.  Patient's current diet at the nursing facility is pured.  [facility] was asked admit this patient for low hemoglobin and concern for GI bleeding.    Hospital Course:  See above   Procedures: 6/6 CT  abdomen pelvis Wo contrast Changes of colonic constipation and findings suggestive of mild stercoral colitis in the rectum.   Punctate nonobstructing renal calculi bilaterally.   Diverticulosis without diverticulitis.   Changes consistent with underlying anemia.     Discharge Exam: Vitals:   12/25/21 1925 12/25/21 1927 12/26/21 0312 12/26/21 0411  BP: (!) 165/106 (!) 155/92 (!) 168/106   Pulse: 72 66 78   Resp: 18  16   Temp: 97.6 F (36.4 C)  97.8 F (36.6 C)   TempSrc: Oral  Oral   SpO2:  100%  100%   Weight:    67.6 kg    General: A/O x1 (does not know where, when, why) pleasantly confused follows all commands No acute respiratory distress, cachectic Eyes: negative scleral hemorrhage, negative anisocoria, negative icterus ENT: Negative Runny nose, negative gingival bleeding, Neck:  Negative scars, masses, torticollis, lymphadenopathy, JVD Lungs: Clear to auscultation bilaterally without wheezes or crackles Cardiovascular: Regular rate and rhythm without murmur gallop or rub normal S1 and S2  Discharge Instructions   Allergies as of 12/26/2021   No Known Allergies      Medication List     STOP taking these medications    clopidogrel 75 MG tablet Commonly known as: PLAVIX       TAKE these medications    alum & mag hydroxide-simeth 200-200-20 MG/5ML suspension Commonly known as: MAALOX/MYLANTA Take 30 mLs by mouth every 6 (six) hours as needed for indigestion or heartburn.   amLODipine 5 MG tablet Commonly known as: NORVASC Take 1 tablet (5 mg total) by mouth daily. What changed: when to take this   ascorbic acid 500 MG tablet Commonly known as: VITAMIN C Take 1 tablet (500 mg total) by mouth daily. Start taking on: December 27, 2021   atorvastatin 40 MG tablet Commonly known as: LIPITOR Take 40 mg by mouth every morning.   ferrous sulfate 325 (65 FE) MG tablet Take 1 tablet (325 mg total) by mouth 2 (two) times daily with a meal. What changed: when to take this   finasteride 5 MG tablet Commonly known as: PROSCAR Take 5 mg by mouth every morning.   guaiFENesin 100 MG/5ML Soln Commonly known as: ROBITUSSIN Take 10 mLs by mouth every 6 (six) hours as needed for cough.   loperamide 2 MG capsule Commonly known as: IMODIUM Take 2 mg by mouth as needed for diarrhea or loose stools (not to exceed 8 doses in 24hrs).   magnesium hydroxide 400 MG/5ML suspension Commonly known as: MILK OF MAGNESIA Take 30 mLs by mouth at bedtime as needed  (constipation).   neomycin-bacitracin-polymyxin [phone] Oint Apply 1 application. topically daily as needed (minor skin tears or abrasians).   ondansetron 4 MG tablet Commonly known as: ZOFRAN Take 1 tablet (4 mg total) by mouth every 6 (six) hours as needed for nausea.   oxyCODONE 5 MG immediate release tablet Commonly known as: Oxy IR/ROXICODONE Take 1 tablet (5 mg total) by mouth every 4 (four) hours as needed for moderate pain.   pantoprazole 40 MG tablet Commonly known as: PROTONIX Take 40 mg by mouth daily at 6 (six) AM.       No Known Allergies    The results of significant diagnostics from this hospitalization (including imaging, microbiology, ancillary and laboratory) are listed below for reference.    Significant Diagnostic Studies: CT ABDOMEN PELVIS WO CONTRAST  Result Date: 12/24/2021 CLINICAL DATA:  Abdominal pain and GI bleeding, initial encounter EXAM: CT ABDOMEN AND PELVIS WITHOUT  CONTRAST TECHNIQUE: Multidetector CT imaging of the abdomen and pelvis was performed following the standard protocol without IV contrast. RADIATION DOSE REDUCTION: This exam was performed according to the departmental dose-optimization program which includes automated exposure control, adjustment of the mA and/or kV according to patient size and/or use of iterative reconstruction technique. COMPARISON:  None Available. FINDINGS: Lower chest: Mild dependent atelectatic changes are noted. Decreased attenuation is noted in the cardiac blood pool suggestive of underlying anemia. Hepatobiliary: No focal liver abnormality is seen. No gallstones, gallbladder wall thickening, or biliary dilatation. Pancreas: Unremarkable. No pancreatic ductal dilatation or surrounding inflammatory changes. Spleen: Normal in size without focal abnormality. Adrenals/Urinary Tract: Adrenal glands are within normal limits. Kidneys demonstrate no obstructive changes. Punctate nonobstructing right renal stone is seen in  the upper pole. Similar findings are noted within the left lower pole. Ureters are within normal limits. The bladder is well distended. Stomach/Bowel: Considerable fecal material is noted within the rectal vault with mild wall thickening consistent with a degree of constipation and possible early stercoral colitis. Fecal material is noted throughout the colon. Diverticular changes noted without evidence of diverticulitis. The appendix is within normal limits. Small bowel and stomach are unremarkable. Vascular/Lymphatic: Aortic atherosclerosis. No enlarged abdominal or pelvic lymph nodes. Reproductive: Prostate is unremarkable. Other: No abdominal wall hernia or abnormality. No abdominopelvic ascites. Musculoskeletal: No acute or significant osseous findings. IMPRESSION: Changes of colonic constipation and findings suggestive of mild stercoral colitis in the rectum. Punctate nonobstructing renal calculi bilaterally. Diverticulosis without diverticulitis. Changes consistent with underlying anemia. Electronically Signed   By: Inez Catalina M.D.   On: 12/24/2021 20:53    Microbiology: No results found for this or any previous visit (from the past 240 hour(s)).   Labs: Basic Metabolic Panel: Recent Labs  Lab 12/24/21 1243 12/25/21 0509 12/26/21 0752  NA 141 142 142  K 4.2 4.0 3.5  CL 109 110 110  CO2 '26 25 25  '$ GLUCOSE 94 79 78  BUN '19 14 11  '$ CREATININE 1.33* 1.15 1.12  CALCIUM 10.1 9.6 9.7  MG  --   --  2.1  PHOS  --   --  2.8   Liver Function Tests: Recent Labs  Lab 12/24/21 1243 12/25/21 0509 12/26/21 0752  AST '19 22 20  '$ ALT '17 16 15  '$ ALKPHOS 51 47 53  BILITOT 0.4 0.4 0.7  PROT 7.4 6.9 7.4  ALBUMIN 3.7 3.4* 3.7   No results for input(s): "LIPASE", "AMYLASE" in the last 168 hours. No results for input(s): "AMMONIA" in the last 168 hours. CBC: Recent Labs  Lab 12/24/21 1243 12/25/21 0509 12/26/21 0752  WBC 4.3 5.2 5.2  NEUTROABS 2.7  --  3.7  HGB 7.8* 8.3* 9.3*  HCT 27.2*  27.8* 31.5*  MCV 76.0* 76.4* 77.0*  PLT 336 292 316   Cardiac Enzymes: No results for input(s): "CKTOTAL", "CKMB", "CKMBINDEX", "TROPONINI" in the last 168 hours. BNP: BNP (last 3 results) No results for input(s): "BNP" in the last 8760 hours.  ProBNP (last 3 results) No results for input(s): "PROBNP" in the last 8760 hours.  CBG: Recent Labs  Lab 12/24/21 1247 12/25/21 0747 12/26/21 0746 12/26/21 0817 12/26/21 0847  GLUCAP 71 78 65* 66* 80       Signed:  Dia Crawford, MD [facility]

## 2022-10-09 ENCOUNTER — Emergency Department (HOSPITAL_COMMUNITY)
Admission: EM | Admit: 2022-10-09 | Discharge: 2022-10-10 | Disposition: A | Discharge status: Home / Self Care | Payer: No Typology Code available for payment source | Attending: Emergency Medicine | Admitting: Emergency Medicine

## 2022-10-09 ENCOUNTER — Other Ambulatory Visit: Payer: Self-pay

## 2022-10-09 ENCOUNTER — Emergency Department (HOSPITAL_COMMUNITY): Payer: No Typology Code available for payment source

## 2022-10-09 ENCOUNTER — Encounter (HOSPITAL_COMMUNITY): Payer: Self-pay

## 2022-10-09 DIAGNOSIS — I129 Hypertensive chronic kidney disease with stage 1 through stage 4 chronic kidney disease, or unspecified chronic kidney disease: Secondary | ICD-10-CM | POA: Diagnosis not present

## 2022-10-09 DIAGNOSIS — R410 Disorientation, unspecified: Secondary | ICD-10-CM | POA: Insufficient documentation

## 2022-10-09 DIAGNOSIS — F0390 Unspecified dementia without behavioral disturbance: Secondary | ICD-10-CM | POA: Insufficient documentation

## 2022-10-09 DIAGNOSIS — R791 Abnormal coagulation profile: Secondary | ICD-10-CM | POA: Diagnosis not present

## 2022-10-09 DIAGNOSIS — R52 Pain, unspecified: Secondary | ICD-10-CM

## 2022-10-09 DIAGNOSIS — Z20822 Contact with and (suspected) exposure to covid-19: Secondary | ICD-10-CM | POA: Diagnosis not present

## 2022-10-09 DIAGNOSIS — Z79899 Other long term (current) drug therapy: Secondary | ICD-10-CM | POA: Diagnosis not present

## 2022-10-09 DIAGNOSIS — N183 Chronic kidney disease, stage 3 unspecified: Secondary | ICD-10-CM | POA: Diagnosis not present

## 2022-10-09 DIAGNOSIS — J189 Pneumonia, unspecified organism: Secondary | ICD-10-CM

## 2022-10-09 DIAGNOSIS — J181 Lobar pneumonia, unspecified organism: Secondary | ICD-10-CM | POA: Diagnosis not present

## 2022-10-09 DIAGNOSIS — M791 Myalgia, unspecified site: Secondary | ICD-10-CM | POA: Diagnosis present

## 2022-10-09 LAB — APTT: aPTT: 33 seconds (ref 24–36)

## 2022-10-09 LAB — CBC WITH DIFFERENTIAL/PLATELET
Abs Immature Granulocytes: 0.01 10*3/uL (ref 0.00–0.07)
Basophils Absolute: 0 10*3/uL (ref 0.0–0.1)
Basophils Relative: 0 %
Eosinophils Absolute: 0 10*3/uL (ref 0.0–0.5)
Eosinophils Relative: 0 %
HCT: 30 % — ABNORMAL LOW (ref 39.0–52.0)
Hemoglobin: 9.6 g/dL — ABNORMAL LOW (ref 13.0–17.0)
Immature Granulocytes: 0 %
Lymphocytes Relative: 3 %
Lymphs Abs: 0.2 10*3/uL — ABNORMAL LOW (ref 0.7–4.0)
MCH: 28.5 pg (ref 26.0–34.0)
MCHC: 32 g/dL (ref 30.0–36.0)
MCV: 89 fL (ref 80.0–100.0)
Monocytes Absolute: 0.5 10*3/uL (ref 0.1–1.0)
Monocytes Relative: 9 %
Neutro Abs: 4.6 10*3/uL (ref 1.7–7.7)
Neutrophils Relative %: 88 %
Platelets: 262 10*3/uL (ref 150–400)
RBC: 3.37 MIL/uL — ABNORMAL LOW (ref 4.22–5.81)
RDW: 16.5 % — ABNORMAL HIGH (ref 11.5–15.5)
WBC: 5.3 10*3/uL (ref 4.0–10.5)
nRBC: 0 % (ref 0.0–0.2)

## 2022-10-09 LAB — COMPREHENSIVE METABOLIC PANEL
ALT: 15 U/L (ref 0–44)
AST: 26 U/L (ref 15–41)
Albumin: 3.4 g/dL — ABNORMAL LOW (ref 3.5–5.0)
Alkaline Phosphatase: 50 U/L (ref 38–126)
Anion gap: 9 (ref 5–15)
BUN: 23 mg/dL (ref 8–23)
CO2: 26 mmol/L (ref 22–32)
Calcium: 9.4 mg/dL (ref 8.9–10.3)
Chloride: 102 mmol/L (ref 98–111)
Creatinine, Ser: 1.44 mg/dL — ABNORMAL HIGH (ref 0.61–1.24)
GFR, Estimated: 53 mL/min — ABNORMAL LOW (ref >60)
Glucose, Bld: 122 mg/dL — ABNORMAL HIGH (ref 70–99)
Potassium: 4.1 mmol/L (ref 3.5–5.1)
Sodium: 137 mmol/L (ref 135–145)
Total Bilirubin: 0.5 mg/dL (ref 0.3–1.2)
Total Protein: 7.2 g/dL (ref 6.5–8.1)

## 2022-10-09 LAB — PROTIME-INR
INR: 1.2 (ref 0.8–1.2)
Prothrombin Time: 14.8 seconds (ref 11.4–15.2)

## 2022-10-09 LAB — LACTIC ACID, PLASMA: Lactic Acid, Venous: 0.9 mmol/L (ref 0.5–1.9)

## 2022-10-09 MED ORDER — LACTATED RINGERS IV BOLUS (SEPSIS)
500.0000 mL | Freq: Once | INTRAVENOUS | Status: AC
  Administered 2022-10-09: 500 mL via INTRAVENOUS

## 2022-10-09 MED ORDER — ACETAMINOPHEN 325 MG PO TABS
650.0000 mg | ORAL_TABLET | Freq: Once | ORAL | Status: AC
  Administered 2022-10-09: 650 mg via ORAL
  Filled 2022-10-09: qty 2

## 2022-10-09 NOTE — ED Triage Notes (Signed)
Patient brought in by EMS from [facility], patient has hx of dementia and aphasia so nursing home reports patient has pain all over, patient points to both legs for pain. Unable to answer any other questions. Temp 100.2, vital signs otherwise stable.

## 2022-10-09 NOTE — ED Provider Notes (Signed)
[facility] Provider Note   CSN: [id number] Arrival date & time: 10/09/22  2230     History  Chief Complaint  Patient presents with   Leg Pain    Karl Miller is a 67 y.o. male.  The history is provided by the patient, a relative and medical records.  Leg Pain Karl Miller is a 67 y.o. male who presents to the Emergency Department complaining of pain all over.  Level V caveat due to dementia.  History as per added by patient's brother at the bedside as well as staff from [facility] via telephone.  Patient is here for being more confused compared to baseline and complaining of pain all over.  Patient complains of headache and not feeling well on review of systems.  Patient will only answer yes no questions and is minimally verbal-per brother and staff this is his baseline.  He does have a history of vascular dementia, AVMs with recurrent GI bleed, stage III CKD, multiple CVAs.       Home Medications Prior to Admission medications   Medication Sig Start Date End Date Taking? Authorizing Provider  amoxicillin-clavulanate (AUGMENTIN) 875-125 MG tablet Take 1 tablet by mouth every 12 (twelve) hours. 10/10/22  Yes Quintella Reichert, MD  azithromycin (ZITHROMAX) 250 MG tablet Take 1 tablet (250 mg total) by mouth daily. Take first 2 tablets together, then 1 every day until finished. 10/11/22  Yes Quintella Reichert, MD  alum & mag hydroxide-simeth (MAALOX/MYLANTA) 200-200-20 MG/5ML suspension Take 30 mLs by mouth every 6 (six) hours as needed for indigestion or heartburn.    [provider]  amLODipine (NORVASC) 5 MG tablet Take 1 tablet (5 mg total) by mouth daily. Patient taking differently: Take 5 mg by mouth every morning. 10/26/20   Rai, Vernelle Emerald, MD  ascorbic acid (VITAMIN C) 500 MG tablet Take 1 tablet (500 mg total) by mouth daily. 12/27/21   Allie Bossier, MD  atorvastatin (LIPITOR) 40 MG tablet Take 40 mg by mouth every  morning. 01/11/20   [provider]  ferrous sulfate 325 (65 FE) MG tablet Take 1 tablet (325 mg total) by mouth 2 (two) times daily with a meal. Patient taking differently: Take 325 mg by mouth every Monday, Wednesday, and Friday. 10/26/20   Rai, Vernelle Emerald, MD  finasteride (PROSCAR) 5 MG tablet Take 5 mg by mouth every morning.    [provider]  guaiFENesin (ROBITUSSIN) 100 MG/5ML SOLN Take 10 mLs by mouth every 6 (six) hours as needed for cough.    [provider]  loperamide (IMODIUM) 2 MG capsule Take 2 mg by mouth as needed for diarrhea or loose stools (not to exceed 8 doses in 24hrs).    [provider]  magnesium hydroxide (MILK OF MAGNESIA) 400 MG/5ML suspension Take 30 mLs by mouth at bedtime as needed (constipation).    [provider]  neomycin-bacitracin-polymyxin [phone] OINT Apply 1 application. topically daily as needed (minor skin tears or abrasians).    [provider]  ondansetron (ZOFRAN) 4 MG tablet Take 1 tablet (4 mg total) by mouth every 6 (six) hours as needed for nausea. 12/26/21   Allie Bossier, MD  oxyCODONE (OXY IR/ROXICODONE) 5 MG immediate release tablet Take 1 tablet (5 mg total) by mouth every 4 (four) hours as needed for moderate pain. 12/26/21   Allie Bossier, MD  pantoprazole (PROTONIX) 40 MG tablet Take 40 mg by mouth daily at 6 (six)  AM.    [provider]      Allergies    Patient has no known allergies.    Review of Systems   Review of Systems  All other systems reviewed and are negative.   Physical Exam Updated Vital Signs BP 120/78   Pulse 71   Temp 98.3 F (36.8 C) (Axillary)   Resp 15   SpO2 98%  Physical Exam Vitals and nursing note reviewed.  Constitutional:      Appearance: He is well-developed.  HENT:     Head: Normocephalic and atraumatic.  Cardiovascular:     Rate and Rhythm: Normal rate and regular rhythm.     Heart sounds: No murmur heard. Pulmonary:      Effort: Pulmonary effort is normal. No respiratory distress.     Breath sounds: Normal breath sounds.  Abdominal:     Palpations: Abdomen is soft.     Tenderness: There is no abdominal tenderness. There is no guarding or rebound.  Musculoskeletal:        General: No swelling or tenderness.     Comments: 2+ febrile pulses bilaterally  Skin:    General: Skin is warm and dry.  Neurological:     Mental Status: He is alert.     Comments: Minimally verbal.  Will only answer yes/no questions and refuses to answer additional questions.  He does move all extremities symmetrically but weakly.  Psychiatric:        Behavior: Behavior normal.     ED Results / Procedures / Treatments   Labs (all labs ordered are listed, but only abnormal results are displayed) Labs Reviewed  COMPREHENSIVE METABOLIC PANEL - Abnormal; Notable for the following components:      Result Value   Glucose, Bld 122 (*)    Creatinine, Ser 1.44 (*)    Albumin 3.4 (*)    GFR, Estimated 53 (*)    All other components within normal limits  CBC WITH DIFFERENTIAL/PLATELET - Abnormal; Notable for the following components:   RBC 3.37 (*)    Hemoglobin 9.6 (*)    HCT 30.0 (*)    RDW 16.5 (*)    Lymphs Abs 0.2 (*)    All other components within normal limits  URINALYSIS, W/ REFLEX TO CULTURE (INFECTION SUSPECTED) - Abnormal; Notable for the following components:   Protein, ur 30 (*)    All other components within normal limits  RESP PANEL BY RT-PCR (RSV, FLU A&B, COVID)  RVPGX2  CULTURE, BLOOD (ROUTINE X 2)  CULTURE, BLOOD (ROUTINE X 2)  LACTIC ACID, PLASMA  PROTIME-INR  APTT    EKG EKG Interpretation  Date/Time:  Thursday October 09 2022 22:57:09 EDT Ventricular Rate:  83 PR Interval:  214 QRS Duration: 118 QT Interval:  330 QTC Calculation: 388 R Axis:   74 Text Interpretation: Sinus rhythm Borderline prolonged PR interval Nonspecific intraventricular conduction delay Borderline T abnormalities, inferior leads  Confirmed by Quintella Reichert [phone]) on 10/09/2022 11:43:30 PM  Radiology CT Head Wo Contrast  Result Date: 10/10/2022 CLINICAL DATA:  Altered mental status, fever EXAM: CT HEAD WITHOUT CONTRAST TECHNIQUE: Contiguous axial images were obtained from the base of the skull through the vertex without intravenous contrast. RADIATION DOSE REDUCTION: This exam was performed according to the departmental dose-optimization program which includes automated exposure control, adjustment of the mA and/or kV according to patient size and/or use of iterative reconstruction technique. COMPARISON:  02/08/2019 FINDINGS: Brain: Parenchymal volume loss is slightly advanced for age, and appears progressive  since prior examination. Moderate bilateral subcortical and periventricular white matter changes are present, similar prior exam, likely reflecting the sequela of small vessel ischemia. Numerous remote lacunar infarcts are noted within the thalami, basal ganglia and corona radiata bilaterally. Remote lacunar pontine infarct again noted. Remote cortical infarcts within the right occipital lobe and left cerebellar hemisphere are again noted. No evidence of acute intracranial hemorrhage or infarct. No abnormal mass effect or midline shift. No abnormal intra or extra-axial mass lesion. Ventricular size is normal. Vascular: No hyperdense vessel or unexpected calcification. Skull: Normal. Negative for fracture or focal lesion. Sinuses/Orbits: No acute finding. Other: Mastoid air cells and middle ear cavities are clear. IMPRESSION: 1. No acute intracranial abnormality. 2. Progressive parenchymal atrophy, slightly advanced for age. 3. Stable moderate chronic small vessel ischemic disease and multiple remote lacunar infarcts. 4. Stable remote cortical infarcts within the right occipital lobe and left cerebellar hemisphere. Electronically Signed   By: Fidela Salisbury M.D.   On: 10/10/2022 00:50   DG Chest Port 1 View  Result Date:  10/10/2022 CLINICAL DATA:  Pain and fever EXAM: PORTABLE CHEST 1 VIEW COMPARISON:  07/30/2019 FINDINGS: Electronic device over left chest. No consolidation or effusion. Possible ground-glass infiltrate in the right upper lobe. Stable cardiomediastinal silhouette. No pneumothorax. IMPRESSION: Possible ground-glass infiltrate in the right upper lobe. Consider short-term two-view chest radiographic follow-up Electronically Signed   By: Donavan Foil M.D.   On: 10/10/2022 00:00    Procedures Procedures    Medications Ordered in ED Medications  amoxicillin-clavulanate (AUGMENTIN) 875-125 MG per tablet 1 tablet (has no administration in time range)  azithromycin (ZITHROMAX) tablet 500 mg (has no administration in time range)  lactated ringers bolus 500 mL (0 mLs Intravenous Stopped 10/10/22 0007)  acetaminophen (TYLENOL) tablet 650 mg (650 mg Oral Given 10/09/22 2355)    ED Course/ Medical Decision Making/ A&P                             Medical Decision Making Amount and/or Complexity of Data Reviewed Labs: ordered. Radiology: ordered. ECG/medicine tests: ordered.  Risk OTC drugs.   Patient with history of vascular dementia, minimally verbal at baseline, stage III CKD, hypertension, recurrent AVMs with chronic GI bleeding here for evaluation of reports of pain.  He did have low-grade temperature at time of ED arrival of 100.2.  He was treated with acetaminophen with resolution of his fever as well as his pain.  Nursing reported that he complained of leg pain, in the emergency department he complained of pain all over.  His legs are soft, nontender and without erythema or edema.  There is no evidence of poor perfusion or soft tissue infection.  No evidence of DVT.  Labs with renal function is baseline and stable anemia.  His lactic acid is normal.  No leukocytosis.  He is negative for COVID and flu.  UA is not consistent with UTI.  Chest x-ray with questionable infiltrate.  Given his low-grade  temperature will start on antibiotics for possible community-acquired pneumonia.  Plan to discharge with close outpatient follow-up and return precautions.  Findings of studies discussed with patient as well as his brother at the bedside.       Final Clinical Impression(s) / ED Diagnoses Final diagnoses:  Pain  Community acquired pneumonia of right upper lobe of lung    Rx / DC Orders ED Discharge Orders          Ordered  azithromycin (ZITHROMAX) 250 MG tablet  Daily        10/10/22 0520    amoxicillin-clavulanate (AUGMENTIN) 875-125 MG tablet  Every 12 hours        10/10/22 0520              Quintella Reichert, MD 10/10/22 [phone]

## 2022-10-10 ENCOUNTER — Emergency Department (HOSPITAL_COMMUNITY): Payer: No Typology Code available for payment source

## 2022-10-10 LAB — URINALYSIS, W/ REFLEX TO CULTURE (INFECTION SUSPECTED)
Bacteria, UA: NONE SEEN
Bilirubin Urine: NEGATIVE
Glucose, UA: NEGATIVE mg/dL
Hgb urine dipstick: NEGATIVE
Ketones, ur: NEGATIVE mg/dL
Leukocytes,Ua: NEGATIVE
Nitrite: NEGATIVE
Protein, ur: 30 mg/dL — AB
Specific Gravity, Urine: 1.023 (ref 1.005–1.030)
pH: 5 (ref 5.0–8.0)

## 2022-10-10 LAB — RESP PANEL BY RT-PCR (RSV, FLU A&B, COVID)  RVPGX2
Influenza A by PCR: NEGATIVE
Influenza B by PCR: NEGATIVE
Resp Syncytial Virus by PCR: NEGATIVE
SARS Coronavirus 2 by RT PCR: NEGATIVE

## 2022-10-10 MED ORDER — AZITHROMYCIN 250 MG PO TABS: 250.0000 mg | ORAL_TABLET | Freq: Every day | ORAL | 0 refills | Status: AC

## 2022-10-10 MED ORDER — AZITHROMYCIN 250 MG PO TABS: 250.0000 mg | ORAL_TABLET | Freq: Every day | ORAL | 0 refills | Status: DC

## 2022-10-10 MED ORDER — AMOXICILLIN-POT CLAVULANATE 875-125 MG PO TABS: 1.0000 | ORAL_TABLET | Freq: Two times a day (BID) | ORAL | 0 refills | Status: DC

## 2022-10-10 MED ORDER — AZITHROMYCIN 250 MG PO TABS
500.0000 mg | ORAL_TABLET | Freq: Once | ORAL | Status: AC
  Administered 2022-10-10: 500 mg via ORAL
  Filled 2022-10-10: qty 2

## 2022-10-10 MED ORDER — AMOXICILLIN-POT CLAVULANATE 875-125 MG PO TABS: 1.0000 | ORAL_TABLET | Freq: Two times a day (BID) | ORAL | 0 refills | Status: AC

## 2022-10-10 MED ORDER — AMOXICILLIN-POT CLAVULANATE 875-125 MG PO TABS
1.0000 | ORAL_TABLET | Freq: Once | ORAL | Status: AC
  Administered 2022-10-10: 1 via ORAL
  Filled 2022-10-10: qty 1

## 2022-10-10 NOTE — ED Notes (Signed)
[facility] called for transport back to facility. [facility] called and report given on patient.

## 2022-10-15 LAB — CULTURE, BLOOD (ROUTINE X 2)
Culture: NO GROWTH
Culture: NO GROWTH
Special Requests: ADEQUATE

## 2022-11-15 IMAGING — CT CT ABD-PELV W/O CM
2 of 4 series · 15 of 46 positions shown, 17 images · non-contrast
Comparison: None Available.

CLINICAL DATA: Abdominal pain and GI bleeding, initial encounter



[Series 2: axial st · axial · 0.69mm/px · z∈[-479,-34]mm · 12 of 101 slices shown, 14 images]
[im 6/101  soft-tissue]
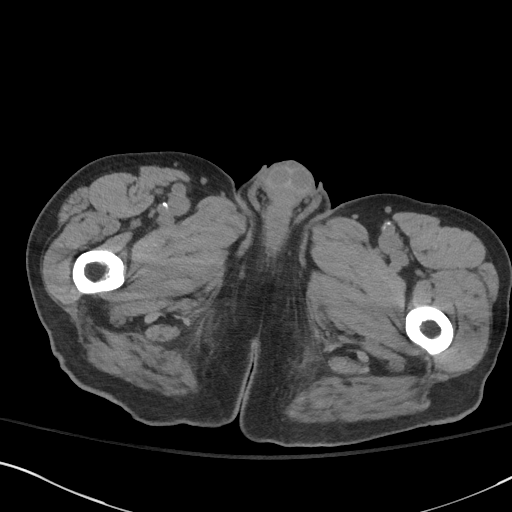
[im 6/101  bone]
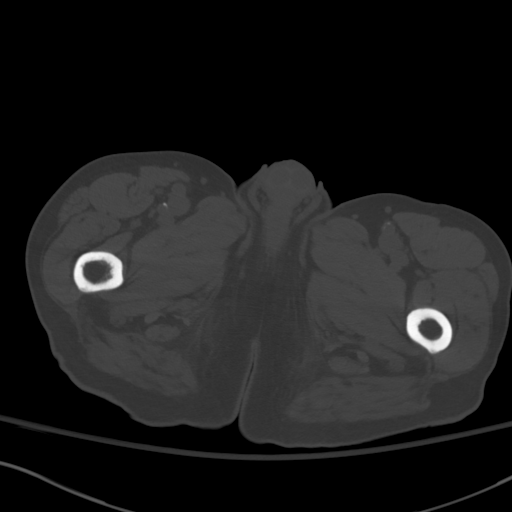
[im 16/101  soft-tissue]
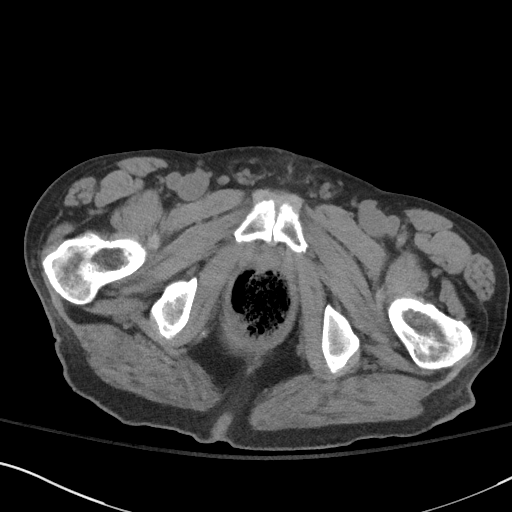
[im 22/101  soft-tissue]
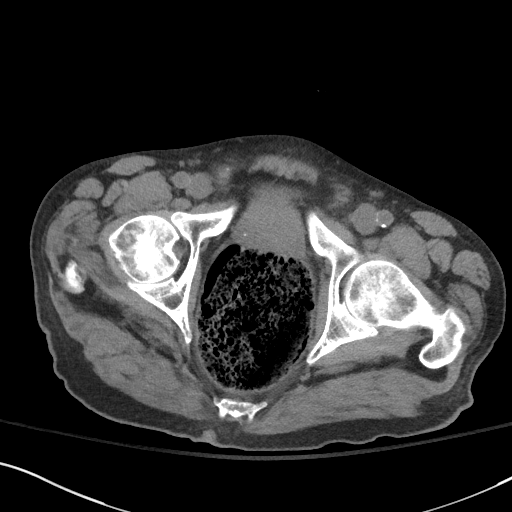
[im 32/101  soft-tissue]
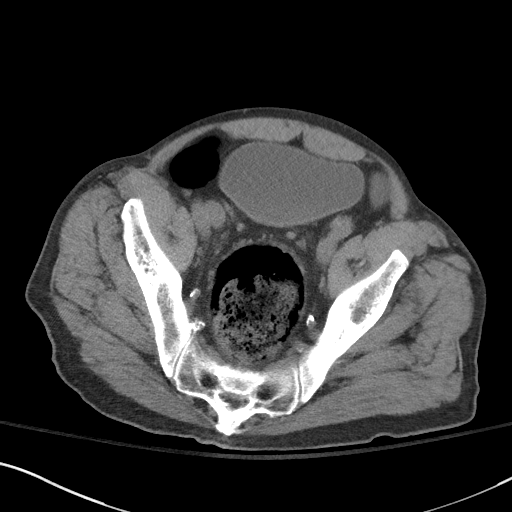
[im 37/101  soft-tissue]
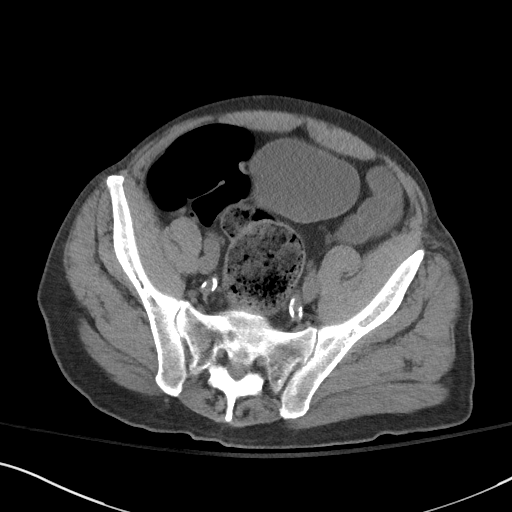
[im 48/101  soft-tissue]
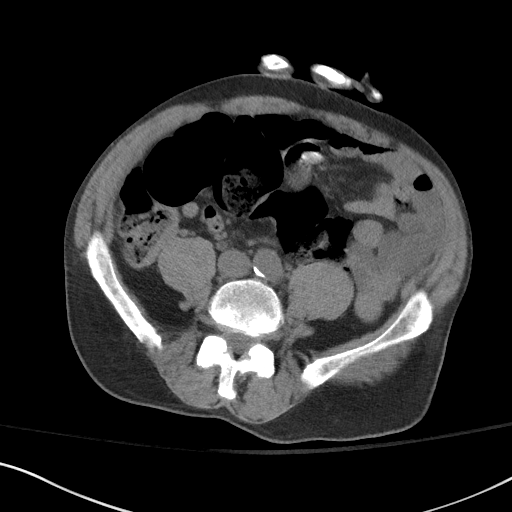
[im 53/101  soft-tissue]
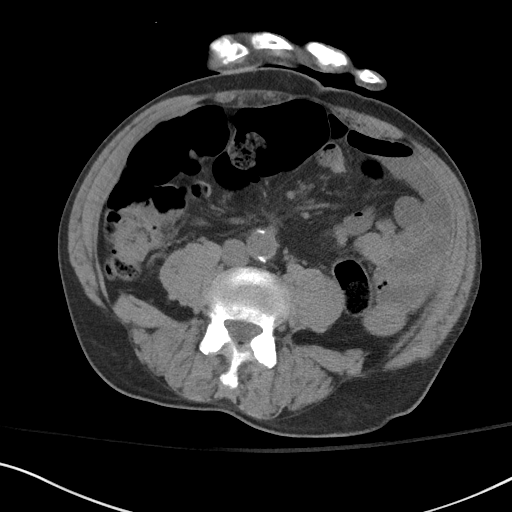
[im 64/101  soft-tissue]
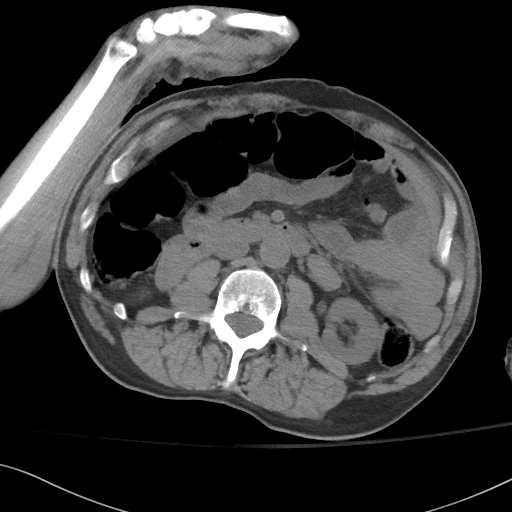
[im 69/101  soft-tissue]
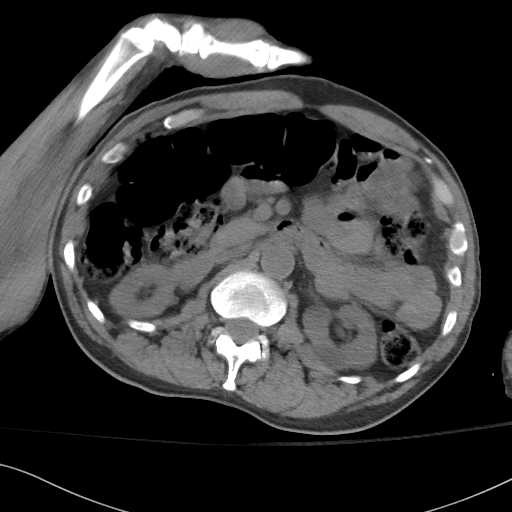
[im 69/101  bone]
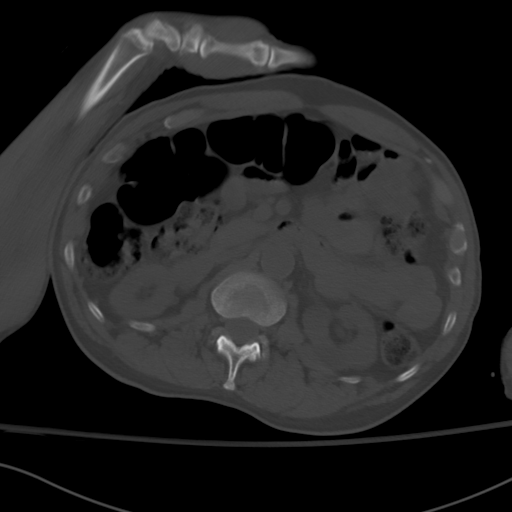
[im 79/101  soft-tissue]
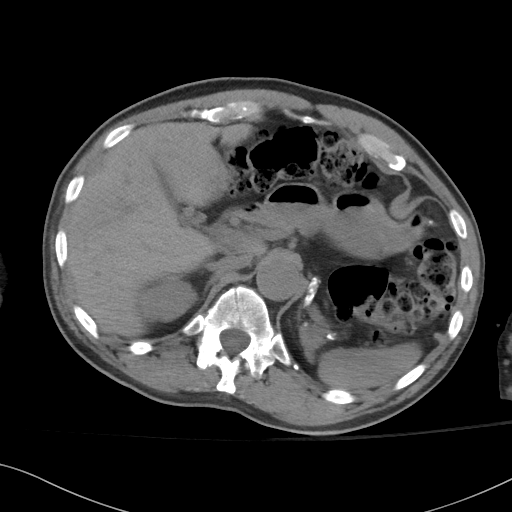
[im 85/101  soft-tissue]
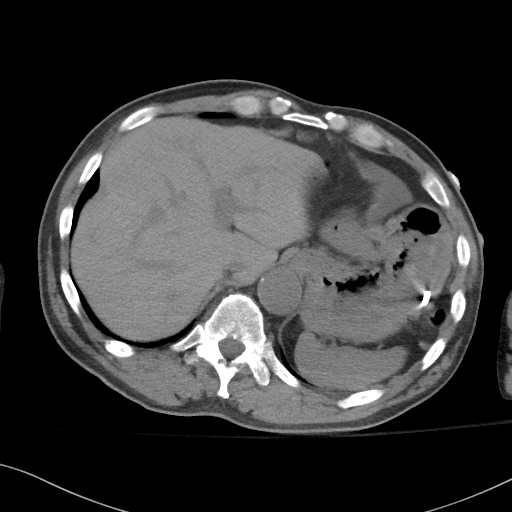
[im 95/101  soft-tissue]
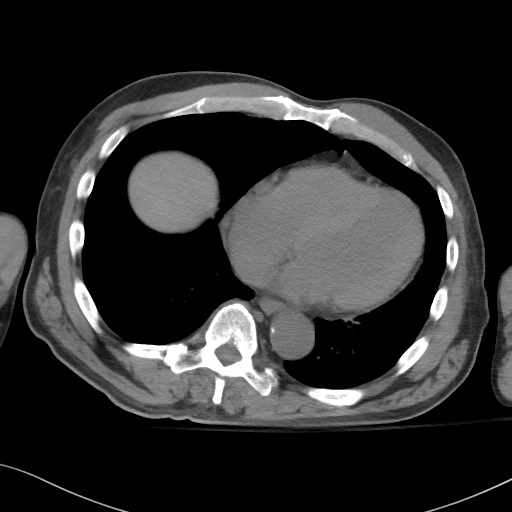

[Series 4: coronal st · coronal · 0.74mm/px · 3 of 83 slices shown]
[im 28/83  soft-tissue]
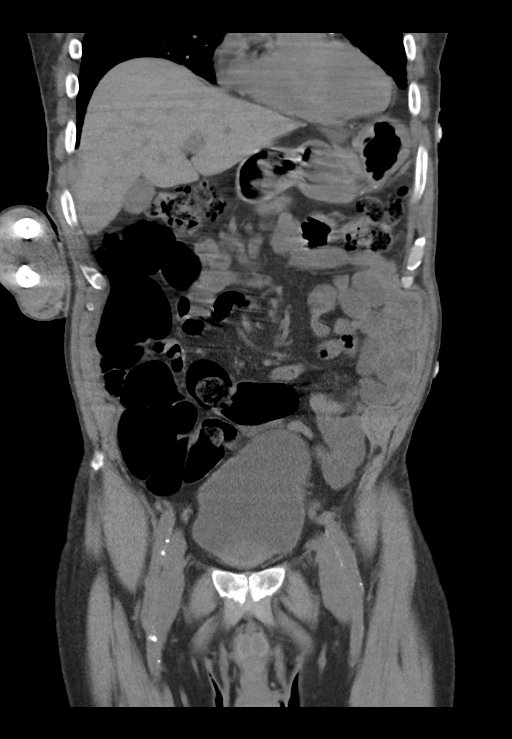
[im 37/83  soft-tissue]
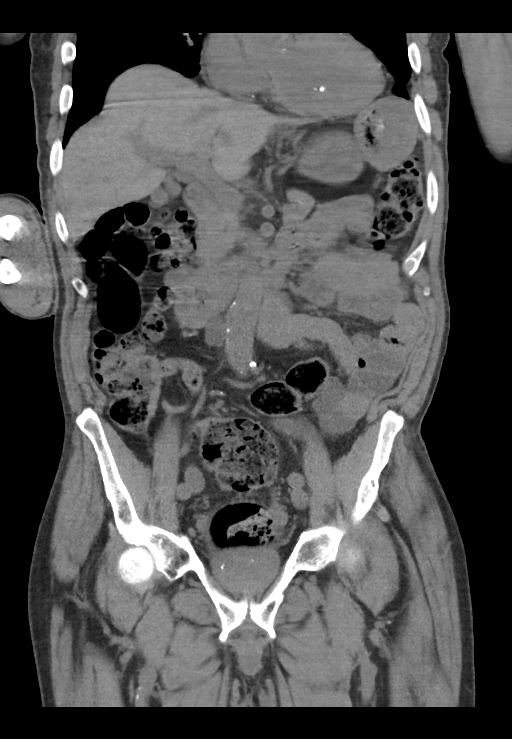
[im 46/83  soft-tissue]
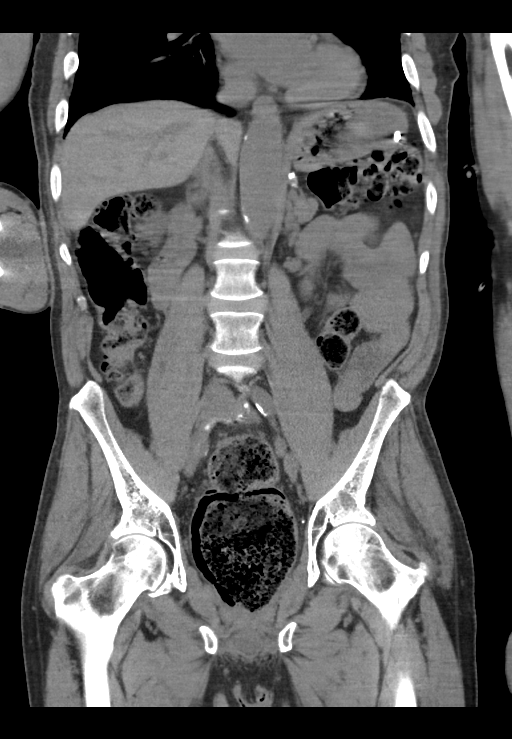

[15 of 46 positions shown; findings below may reference images not displayed]

FINDINGS: Lower chest: Mild dependent atelectatic changes are noted. Decreased
attenuation is noted in the cardiac blood pool suggestive of
underlying anemia.

Hepatobiliary: No focal liver abnormality is seen. No gallstones,
gallbladder wall thickening, or biliary dilatation.

Pancreas: Unremarkable. No pancreatic ductal dilatation or
surrounding inflammatory changes.

Spleen: Normal in size without focal abnormality.

Adrenals/Urinary Tract: Adrenal glands are within normal limits.
Kidneys demonstrate no obstructive changes. Punctate nonobstructing
right renal stone is seen in the upper pole. Similar findings are
noted within the left lower pole. Ureters are within normal limits.
The bladder is well distended.

Stomach/Bowel: Considerable fecal material is noted within the
rectal vault with mild wall thickening consistent with a degree of
constipation and possible early stercoral colitis. Fecal material is
noted throughout the colon. Diverticular changes noted without
evidence of diverticulitis. The appendix is within normal limits.
Small bowel and stomach are unremarkable.

Vascular/Lymphatic: Aortic atherosclerosis. No enlarged abdominal or
pelvic lymph nodes.

Reproductive: Prostate is unremarkable.

Other: No abdominal wall hernia or abnormality. No abdominopelvic
ascites.

Musculoskeletal: No acute or significant osseous findings.
IMPRESSION: Changes of colonic constipation and findings suggestive of mild
stercoral colitis in the rectum.

Punctate nonobstructing renal calculi bilaterally.

Diverticulosis without diverticulitis.

Changes consistent with underlying anemia.

## 2023-07-15 ENCOUNTER — Emergency Department (HOSPITAL_COMMUNITY)
Admission: EM | Admit: 2023-07-15 | Discharge: 2023-07-15 | Disposition: A | Discharge status: Home / Self Care | Payer: No Typology Code available for payment source | Attending: Emergency Medicine | Admitting: Emergency Medicine

## 2023-07-15 ENCOUNTER — Emergency Department (HOSPITAL_COMMUNITY): Payer: No Typology Code available for payment source

## 2023-07-15 ENCOUNTER — Other Ambulatory Visit: Payer: Self-pay

## 2023-07-15 DIAGNOSIS — W1839XA Other fall on same level, initial encounter: Secondary | ICD-10-CM | POA: Diagnosis not present

## 2023-07-15 DIAGNOSIS — F0390 Unspecified dementia without behavioral disturbance: Secondary | ICD-10-CM | POA: Insufficient documentation

## 2023-07-15 DIAGNOSIS — S32010D Wedge compression fracture of first lumbar vertebra, subsequent encounter for fracture with routine healing: Secondary | ICD-10-CM | POA: Insufficient documentation

## 2023-07-15 DIAGNOSIS — I700 Atherosclerosis of aorta: Secondary | ICD-10-CM | POA: Diagnosis not present

## 2023-07-15 DIAGNOSIS — K5900 Constipation, unspecified: Secondary | ICD-10-CM | POA: Diagnosis not present

## 2023-07-15 DIAGNOSIS — I10 Essential (primary) hypertension: Secondary | ICD-10-CM | POA: Insufficient documentation

## 2023-07-15 DIAGNOSIS — I2510 Atherosclerotic heart disease of native coronary artery without angina pectoris: Secondary | ICD-10-CM | POA: Diagnosis not present

## 2023-07-15 DIAGNOSIS — W19XXXA Unspecified fall, initial encounter: Secondary | ICD-10-CM

## 2023-07-15 DIAGNOSIS — Z79899 Other long term (current) drug therapy: Secondary | ICD-10-CM | POA: Diagnosis not present

## 2023-07-15 DIAGNOSIS — S3992XD Unspecified injury of lower back, subsequent encounter: Secondary | ICD-10-CM | POA: Diagnosis present

## 2023-07-15 LAB — CBC WITH DIFFERENTIAL/PLATELET
Abs Immature Granulocytes: 0.01 10*3/uL (ref 0.00–0.07)
Basophils Absolute: 0 10*3/uL (ref 0.0–0.1)
Basophils Relative: 0 %
Eosinophils Absolute: 0.1 10*3/uL (ref 0.0–0.5)
Eosinophils Relative: 1 %
HCT: 27.9 % — ABNORMAL LOW (ref 39.0–52.0)
Hemoglobin: 9.2 g/dL — ABNORMAL LOW (ref 13.0–17.0)
Immature Granulocytes: 0 %
Lymphocytes Relative: 11 %
Lymphs Abs: 0.4 10*3/uL — ABNORMAL LOW (ref 0.7–4.0)
MCH: 29.6 pg (ref 26.0–34.0)
MCHC: 33 g/dL (ref 30.0–36.0)
MCV: 89.7 fL (ref 80.0–100.0)
Monocytes Absolute: 0.4 10*3/uL (ref 0.1–1.0)
Monocytes Relative: 12 %
Neutro Abs: 2.8 10*3/uL (ref 1.7–7.7)
Neutrophils Relative %: 76 %
Platelets: 238 10*3/uL (ref 150–400)
RBC: 3.11 MIL/uL — ABNORMAL LOW (ref 4.22–5.81)
RDW: 16.6 % — ABNORMAL HIGH (ref 11.5–15.5)
WBC: 3.7 10*3/uL — ABNORMAL LOW (ref 4.0–10.5)
nRBC: 0 % (ref 0.0–0.2)

## 2023-07-15 LAB — COMPREHENSIVE METABOLIC PANEL
ALT: 21 U/L (ref 0–44)
AST: 26 U/L (ref 15–41)
Albumin: 3.1 g/dL — ABNORMAL LOW (ref 3.5–5.0)
Alkaline Phosphatase: 54 U/L (ref 38–126)
Anion gap: 6 (ref 5–15)
BUN: 21 mg/dL (ref 8–23)
CO2: 29 mmol/L (ref 22–32)
Calcium: 9.9 mg/dL (ref 8.9–10.3)
Chloride: 104 mmol/L (ref 98–111)
Creatinine, Ser: 1.17 mg/dL (ref 0.61–1.24)
GFR, Estimated: >60 mL/min (ref >60)
Glucose, Bld: 141 mg/dL — ABNORMAL HIGH (ref 70–99)
Potassium: 4.3 mmol/L (ref 3.5–5.1)
Sodium: 139 mmol/L (ref 135–145)
Total Bilirubin: 0.3 mg/dL (ref <1.2)
Total Protein: 6.9 g/dL (ref 6.5–8.1)

## 2023-07-15 LAB — I-STAT CHEM 8, ED
BUN: 20 mg/dL (ref 8–23)
Calcium, Ion: 1.17 mmol/L (ref 1.15–1.40)
Chloride: 109 mmol/L (ref 98–111)
Creatinine, Ser: 1.1 mg/dL (ref 0.61–1.24)
Glucose, Bld: 118 mg/dL — ABNORMAL HIGH (ref 70–99)
HCT: 23 % — ABNORMAL LOW (ref 39.0–52.0)
Hemoglobin: 7.8 g/dL — ABNORMAL LOW (ref 13.0–17.0)
Potassium: 3.7 mmol/L (ref 3.5–5.1)
Sodium: 144 mmol/L (ref 135–145)
TCO2: 25 mmol/L (ref 22–32)

## 2023-07-15 LAB — TYPE AND SCREEN
ABO/RH(D): O NEG
Antibody Screen: POSITIVE

## 2023-07-15 MED ORDER — IOHEXOL 300 MG/ML  SOLN
100.0000 mL | Freq: Once | INTRAMUSCULAR | Status: AC | PRN
  Administered 2023-07-15: 100 mL via INTRAVENOUS

## 2023-07-15 NOTE — ED Provider Notes (Signed)
[facility]ENC[facility] DEPARTMENT AT [facility] Provider Note   CSN: [id number] Arrival date & time: 07/15/23  1543     History  Chief Complaint  Patient presents with   Fall   unwitnessed fall    Karl Miller is a 67 y.o. male history of hypertension, dementia, here presenting with unwitnessed fall.  Patient is from facility.  There was a reported fall but patient is demented unable to tell me.  He denies passing out.  Denies any chest pain.  He denies any abdominal pain or vomiting to me.  The history is provided by the patient.       Home Medications Prior to Admission medications   Medication Sig Start Date End Date Taking? Authorizing Provider  alum & mag hydroxide-simeth (MAALOX/MYLANTA) 200-200-20 MG/5ML suspension Take 30 mLs by mouth every 6 (six) hours as needed for indigestion or heartburn.    [provider]  amLODipine (NORVASC) 5 MG tablet Take 1 tablet (5 mg total) by mouth daily. Patient taking differently: Take 5 mg by mouth every morning. 10/26/20   Rai, Delene Ruffini, MD  amoxicillin-clavulanate (AUGMENTIN) 875-125 MG tablet Take 1 tablet by mouth every 12 (twelve) hours. 10/10/22   Tilden Fossa, MD  ascorbic acid (VITAMIN C) 500 MG tablet Take 1 tablet (500 mg total) by mouth daily. 12/27/21   Drema Dallas, MD  atorvastatin (LIPITOR) 40 MG tablet Take 40 mg by mouth every morning. 01/11/20   [provider]  azithromycin (ZITHROMAX) 250 MG tablet Take 1 tablet (250 mg total) by mouth daily. Take first 2 tablets together, then 1 every day until finished. 10/11/22   Tilden Fossa, MD  ferrous sulfate 325 (65 FE) MG tablet Take 1 tablet (325 mg total) by mouth 2 (two) times daily with a meal. Patient taking differently: Take 325 mg by mouth every Monday, Wednesday, and Friday. 10/26/20   Rai, Delene Ruffini, MD  finasteride (PROSCAR) 5 MG tablet Take 5 mg by mouth every morning.    [provider]  guaiFENesin (ROBITUSSIN) 100  MG/5ML SOLN Take 10 mLs by mouth every 6 (six) hours as needed for cough.    [provider]  loperamide (IMODIUM) 2 MG capsule Take 2 mg by mouth as needed for diarrhea or loose stools (not to exceed 8 doses in 24hrs).    [provider]  magnesium hydroxide (MILK OF MAGNESIA) 400 MG/5ML suspension Take 30 mLs by mouth at bedtime as needed (constipation).    [provider]  neomycin-bacitracin-polymyxin [phone] OINT Apply 1 application. topically daily as needed (minor skin tears or abrasians).    [provider]  ondansetron (ZOFRAN) 4 MG tablet Take 1 tablet (4 mg total) by mouth every 6 (six) hours as needed for nausea. 12/26/21   Drema Dallas, MD  oxyCODONE (OXY IR/ROXICODONE) 5 MG immediate release tablet Take 1 tablet (5 mg total) by mouth every 4 (four) hours as needed for moderate pain. 12/26/21   Drema Dallas, MD  pantoprazole (PROTONIX) 40 MG tablet Take 40 mg by mouth daily at 6 (six) AM.    [provider]      Allergies    Patient has no known allergies.    Review of Systems   Review of Systems  All other systems reviewed and are negative.   Physical Exam Updated Vital Signs BP 116/80 (BP Location: Right Arm)   Pulse 71   Temp 98.2 F (36.8 C) (Oral)   Resp 14  SpO2 100%  Physical Exam Vitals and nursing note reviewed.  HENT:     Head:     Comments: Patient has lipoma in the frontal scalp.  No obvious scalp hematoma    Nose: Nose normal.     Mouth/Throat:     Mouth: Mucous membranes are moist.  Eyes:     Extraocular Movements: Extraocular movements intact.     Pupils: Pupils are equal, round, and reactive to light.  Cardiovascular:     Rate and Rhythm: Normal rate and regular rhythm.     Pulses: Normal pulses.     Heart sounds: Normal heart sounds.  Pulmonary:     Effort: Pulmonary effort is normal.     Breath sounds: Normal breath sounds.  Abdominal:     General: Abdomen is flat.     Palpations: Abdomen  is soft.  Musculoskeletal:     Cervical back: Normal range of motion and neck supple.     Comments: No obvious spinal tenderness.  No obvious extremity trauma  Skin:    General: Skin is warm.     Capillary Refill: Capillary refill takes less than 2 seconds.  Neurological:     General: No focal deficit present.     Mental Status: He is alert.     Comments: ANO x 1 which is baseline.  Patient is moving all extremities.  Patient is following commands.  Psychiatric:        Mood and Affect: Mood normal.        Behavior: Behavior normal.     ED Results / Procedures / Treatments   Labs (all labs ordered are listed, but only abnormal results are displayed) Labs Reviewed - No data to display  EKG None  Radiology No results found.  Procedures Procedures    Medications Ordered in ED Medications - No data to display  ED Course/ Medical Decision Making/ A&P                                 Medical Decision Making Karl Miller is a 67 y.o. male here presenting with possible unwitnessed fall.  Unclear if he hit his head or not.  Plan to get CT head and cervical spine.  Will also get chest and pelvis x-rays.  Patient's mental status is at baseline.  There was reported abdominal pain but he has no abdominal tenderness and he did he denies any abdominal pain to me.  4:51 PM Patient's brother is now at bedside.  He told me that patient has possible prostate cancer and also has history of GI bleed.  He request blood work and CT scan to assess current status of his prostate cancer.   7:46 PM I reviewed patient's labs and they were unremarkable.  In particular hemoglobin is baseline at 9.  CT head and cervical spine unremarkable.  CT chest abdomen pelvis showed age-indeterminate L1 fracture.  Patient had no spinal tenderness at this time.  Patient also had possible left clavicular vascular channel versus fracture but he has no tenderness there.  Patient also had right lower lobe groundglass  opacities.  However patient has no leukocytosis or fever or cough so we will hold off on antibiotics.  Patient does have possible stool impaction on CT.  I performed rectal exam and there is no stool in the vault.  Will start patient on MiraLAX for constipation.  At this point patient stable for discharge back to  facility.  Brother is at bedside and updated both above regarding the plan  Problems Addressed: Closed compression fracture of L1 lumbar vertebra with routine healing, subsequent encounter: acute illness or injury Constipation, unspecified constipation type: acute illness or injury Fall, initial encounter: acute illness or injury  Amount and/or Complexity of Data Reviewed Labs: ordered. Decision-making details documented in ED Course. Radiology: ordered and independent interpretation performed. Decision-making details documented in ED Course.  Risk Prescription drug management.    Final Clinical Impression(s) / ED Diagnoses Final diagnoses:  None    Rx / DC Orders ED Discharge Orders     None         Charlynne Pander, MD 07/15/23 [phone]

## 2023-07-15 NOTE — ED Triage Notes (Signed)
Pt arrived via [facility] from [facility] facility. EMS states that he had an unwitnessed fall today. Pt ambulates with a walker. Facility also states that he has had abd pain for about 5 days now. Pt denies pain and N and V. Pt is not on blood thinners.  BP 102/64  HR 80  O2 96% RA

## 2023-07-15 NOTE — ED Notes (Signed)
[facility] transport setup for pt

## 2023-07-15 NOTE — Discharge Instructions (Addendum)
As we discussed, your CT scan did not show any acute fractures.  You had old lumbar compression fracture.    You are also constipated.  I recommend MiraLAX twice daily for a week to help you with constipation.  Follow-up with your doctor  Return to ER if you have another fall, abdominal pain or vomiting
